# Patient Record
Sex: Female | Born: 1935
Health system: Southern US, Community
[De-identification: ages and names within clinical notes are randomized; demographics above are authoritative.]

## PROBLEM LIST (undated history)

## (undated) DIAGNOSIS — C801 Malignant (primary) neoplasm, unspecified: Secondary | ICD-10-CM

## (undated) DIAGNOSIS — J449 Chronic obstructive pulmonary disease, unspecified: Secondary | ICD-10-CM

## (undated) DIAGNOSIS — G4730 Sleep apnea, unspecified: Secondary | ICD-10-CM

## (undated) DIAGNOSIS — C73 Malignant neoplasm of thyroid gland: Secondary | ICD-10-CM

## (undated) DIAGNOSIS — F0280 Dementia in other diseases classified elsewhere without behavioral disturbance: Secondary | ICD-10-CM

## (undated) DIAGNOSIS — G309 Alzheimer's disease, unspecified: Secondary | ICD-10-CM

## (undated) DIAGNOSIS — H3530 Unspecified macular degeneration: Secondary | ICD-10-CM

## (undated) DIAGNOSIS — I509 Heart failure, unspecified: Secondary | ICD-10-CM

## (undated) DIAGNOSIS — I4891 Unspecified atrial fibrillation: Secondary | ICD-10-CM

## (undated) HISTORY — PX: THYROIDECTOMY: SHX17

## (undated) HISTORY — PX: TONSILLECTOMY: SUR1361

## (undated) HISTORY — PX: APPENDECTOMY: SHX54

## (undated) HISTORY — DX: Malignant neoplasm of thyroid gland: C73

## (undated) HISTORY — PX: OTHER SURGICAL HISTORY: SHX169

---

## 2002-12-01 ENCOUNTER — Encounter (HOSPITAL_COMMUNITY): Admission: RE | Admit: 2002-12-01 | Discharge: 2002-12-31 | Payer: Self-pay | Admitting: Internal Medicine

## 2003-02-17 ENCOUNTER — Inpatient Hospital Stay (HOSPITAL_COMMUNITY): Admission: AD | Admit: 2003-02-17 | Discharge: 2003-02-18 | Payer: Self-pay | Admitting: Internal Medicine

## 2003-02-21 ENCOUNTER — Encounter (HOSPITAL_COMMUNITY): Admission: RE | Admit: 2003-02-21 | Discharge: 2003-03-23 | Payer: Self-pay | Admitting: Internal Medicine

## 2004-02-27 ENCOUNTER — Encounter (HOSPITAL_COMMUNITY): Admission: RE | Admit: 2004-02-27 | Discharge: 2004-03-01 | Payer: Self-pay | Admitting: Internal Medicine

## 2004-11-22 ENCOUNTER — Ambulatory Visit: Payer: Self-pay | Admitting: Internal Medicine

## 2005-01-01 ENCOUNTER — Ambulatory Visit: Payer: Self-pay | Admitting: Internal Medicine

## 2008-12-06 ENCOUNTER — Ambulatory Visit: Payer: Self-pay | Admitting: Gastroenterology

## 2009-03-01 ENCOUNTER — Inpatient Hospital Stay: Payer: Self-pay | Admitting: Internal Medicine

## 2009-05-07 ENCOUNTER — Emergency Department: Payer: Self-pay | Admitting: Internal Medicine

## 2009-05-27 ENCOUNTER — Inpatient Hospital Stay: Payer: Self-pay | Admitting: Internal Medicine

## 2010-08-28 ENCOUNTER — Inpatient Hospital Stay: Payer: Self-pay | Admitting: Internal Medicine

## 2011-10-08 ENCOUNTER — Inpatient Hospital Stay: Payer: Self-pay | Admitting: Internal Medicine

## 2011-10-08 LAB — BASIC METABOLIC PANEL
Anion Gap: 13 (ref 7–16)
BUN: 22 mg/dL — ABNORMAL HIGH (ref 7–18)
Calcium, Total: 6.3 mg/dL — CL (ref 8.5–10.1)
Creatinine: 0.68 mg/dL (ref 0.60–1.30)
EGFR (African American): >60
EGFR (Non-African Amer.): >60
Glucose: 90 mg/dL (ref 65–99)
Osmolality: 282 (ref 275–301)
Sodium: 140 mmol/L (ref 136–145)

## 2011-10-08 LAB — URINALYSIS, COMPLETE
Bilirubin,UR: NEGATIVE
Glucose,UR: NEGATIVE mg/dL (ref 0–75)
Ketone: NEGATIVE
Leukocyte Esterase: NEGATIVE
Nitrite: NEGATIVE
Ph: 5 (ref 4.5–8.0)
Protein: NEGATIVE
RBC,UR: 15 /HPF (ref 0–5)
Specific Gravity: 1.023 (ref 1.003–1.030)
Squamous Epithelial: NONE SEEN
WBC UR: 4 /HPF (ref 0–5)

## 2011-10-08 LAB — CBC
HCT: 33 % — ABNORMAL LOW (ref 35.0–47.0)
HGB: 10.6 g/dL — ABNORMAL LOW (ref 12.0–16.0)
MCH: 27 pg (ref 26.0–34.0)
MCHC: 32.1 g/dL (ref 32.0–36.0)
MCV: 84 fL (ref 80–100)
RBC: 3.93 10*6/uL (ref 3.80–5.20)
RDW: 14.9 % — ABNORMAL HIGH (ref 11.5–14.5)
WBC: 6.6 10*3/uL (ref 3.6–11.0)

## 2011-10-08 LAB — MAGNESIUM
Magnesium: 1.5 mg/dL — ABNORMAL LOW
Magnesium: 15.6 mg/dL
Magnesium: 1.8 mg/dL

## 2011-10-08 LAB — COMPREHENSIVE METABOLIC PANEL
Albumin: 3.7 g/dL (ref 3.4–5.0)
Alkaline Phosphatase: 63 U/L (ref 50–136)
Anion Gap: 11 (ref 7–16)
BUN: 26 mg/dL — ABNORMAL HIGH (ref 7–18)
Bilirubin,Total: 0.5 mg/dL (ref 0.2–1.0)
Calcium, Total: 6 mg/dL — CL (ref 8.5–10.1)
Chloride: 95 mmol/L — ABNORMAL LOW (ref 98–107)
Co2: 36 mmol/L — ABNORMAL HIGH (ref 21–32)
Creatinine: 0.81 mg/dL (ref 0.60–1.30)
EGFR (African American): >60
EGFR (Non-African Amer.): >60
Osmolality: 289 (ref 275–301)
Potassium: 4 mmol/L (ref 3.5–5.1)
SGOT(AST): 86 U/L — ABNORMAL HIGH (ref 15–37)
SGPT (ALT): 71 U/L
Sodium: 142 mmol/L (ref 136–145)
Total Protein: 7.9 g/dL (ref 6.4–8.2)

## 2011-10-08 LAB — DIGOXIN LEVEL
Digoxin: 0.67 ng/mL
Digoxin: 0.67 ng/mL

## 2011-10-08 LAB — TROPONIN I: Troponin-I: <0.02 ng/mL

## 2011-10-09 LAB — CBC WITH DIFFERENTIAL/PLATELET
Basophil #: 0 10*3/uL (ref 0.0–0.1)
Basophil %: 0.1 %
HCT: 30.7 % — ABNORMAL LOW (ref 35.0–47.0)
Lymphocyte #: 0.4 10*3/uL — ABNORMAL LOW (ref 1.0–3.6)
Lymphocyte %: 9.6 %
MCH: 27.1 pg (ref 26.0–34.0)
MCHC: 32.5 g/dL (ref 32.0–36.0)
MCV: 83 fL (ref 80–100)
Monocyte #: 0.2 10*3/uL (ref 0.0–0.7)
Neutrophil #: 4 10*3/uL (ref 1.4–6.5)
Platelet: 204 10*3/uL (ref 150–440)
RDW: 14.8 % — ABNORMAL HIGH (ref 11.5–14.5)

## 2011-10-09 LAB — MAGNESIUM: Magnesium: 1.4 mg/dL — ABNORMAL LOW

## 2011-10-09 LAB — BASIC METABOLIC PANEL
Calcium, Total: 6.1 mg/dL — CL (ref 8.5–10.1)
Chloride: 92 mmol/L — ABNORMAL LOW (ref 98–107)
Co2: 35 mmol/L — ABNORMAL HIGH (ref 21–32)
Creatinine: 0.71 mg/dL (ref 0.60–1.30)
EGFR (Non-African Amer.): >60
Glucose: 139 mg/dL — ABNORMAL HIGH (ref 65–99)
Potassium: 3.9 mmol/L (ref 3.5–5.1)
Sodium: 138 mmol/L (ref 136–145)

## 2011-10-09 LAB — PROTIME-INR
INR: 1
Prothrombin Time: 13.7 secs (ref 11.5–14.7)

## 2011-10-09 LAB — ALBUMIN: Albumin: 3.1 g/dL — ABNORMAL LOW (ref 3.4–5.0)

## 2011-10-10 LAB — BASIC METABOLIC PANEL
Anion Gap: 8 (ref 7–16)
Calcium, Total: 6.3 mg/dL — CL (ref 8.5–10.1)
Co2: 37 mmol/L — ABNORMAL HIGH (ref 21–32)
Creatinine: 0.89 mg/dL (ref 0.60–1.30)
EGFR (African American): >60
EGFR (Non-African Amer.): >60
Osmolality: 280 (ref 275–301)

## 2011-10-10 LAB — MAGNESIUM: Magnesium: 1.9 mg/dL

## 2011-10-11 LAB — CBC WITH DIFFERENTIAL/PLATELET
Basophil %: 0.1 %
Eosinophil %: 0 %
HCT: 32.2 % — ABNORMAL LOW (ref 35.0–47.0)
Lymphocyte #: 0.4 10*3/uL — ABNORMAL LOW (ref 1.0–3.6)
MCHC: 32.9 g/dL (ref 32.0–36.0)
MCV: 84 fL (ref 80–100)
Monocyte #: 0.2 10*3/uL (ref 0.0–0.7)
Monocyte %: 2.8 %
Neutrophil #: 7.8 10*3/uL — ABNORMAL HIGH (ref 1.4–6.5)
RBC: 3.85 10*6/uL (ref 3.80–5.20)
RDW: 15.5 % — ABNORMAL HIGH (ref 11.5–14.5)

## 2011-10-11 LAB — BASIC METABOLIC PANEL
Anion Gap: 10 (ref 7–16)
BUN: 33 mg/dL — ABNORMAL HIGH (ref 7–18)
Calcium, Total: 6.3 mg/dL — CL (ref 8.5–10.1)
EGFR (African American): >60
EGFR (Non-African Amer.): >60
Glucose: 149 mg/dL — ABNORMAL HIGH (ref 65–99)
Osmolality: 288 (ref 275–301)
Potassium: 3.8 mmol/L (ref 3.5–5.1)

## 2011-10-12 LAB — BASIC METABOLIC PANEL
Anion Gap: 10 (ref 7–16)
BUN: 33 mg/dL — ABNORMAL HIGH (ref 7–18)
Calcium, Total: 6.4 mg/dL — CL (ref 8.5–10.1)
Chloride: 89 mmol/L — ABNORMAL LOW (ref 98–107)
Co2: 40 mmol/L (ref 21–32)
Osmolality: 286 (ref 275–301)
Potassium: 4 mmol/L (ref 3.5–5.1)

## 2011-10-13 LAB — BASIC METABOLIC PANEL
BUN: 27 mg/dL — ABNORMAL HIGH (ref 7–18)
Calcium, Total: 6.3 mg/dL — CL (ref 8.5–10.1)
EGFR (African American): >60
EGFR (Non-African Amer.): >60
Glucose: 92 mg/dL (ref 65–99)
Osmolality: 288 (ref 275–301)
Potassium: 3.5 mmol/L (ref 3.5–5.1)
Sodium: 142 mmol/L (ref 136–145)

## 2011-10-14 LAB — BASIC METABOLIC PANEL
Anion Gap: 10 (ref 7–16)
BUN: 23 mg/dL — ABNORMAL HIGH (ref 7–18)
Chloride: 87 mmol/L — ABNORMAL LOW (ref 98–107)
Co2: 41 mmol/L (ref 21–32)
Creatinine: 0.71 mg/dL (ref 0.60–1.30)
EGFR (African American): >60
EGFR (Non-African Amer.): >60
Glucose: 87 mg/dL (ref 65–99)
Osmolality: 279 (ref 275–301)
Sodium: 138 mmol/L (ref 136–145)

## 2011-10-14 LAB — MAGNESIUM: Magnesium: 1.8 mg/dL

## 2012-01-17 LAB — URINALYSIS, COMPLETE
Bilirubin,UR: NEGATIVE
Blood: NEGATIVE
Glucose,UR: NEGATIVE mg/dL (ref 0–75)
Ketone: NEGATIVE
Nitrite: NEGATIVE
Ph: 6 (ref 4.5–8.0)
RBC,UR: 6 /HPF (ref 0–5)
Specific Gravity: 1.012 (ref 1.003–1.030)
Squamous Epithelial: 18

## 2012-01-17 LAB — CBC
MCH: 26.2 pg (ref 26.0–34.0)
MCHC: 32.4 g/dL (ref 32.0–36.0)
RDW: 14.9 % — ABNORMAL HIGH (ref 11.5–14.5)

## 2012-01-17 LAB — COMPREHENSIVE METABOLIC PANEL
Alkaline Phosphatase: 79 U/L (ref 50–136)
BUN: 25 mg/dL — ABNORMAL HIGH (ref 7–18)
Bilirubin,Total: 0.4 mg/dL (ref 0.2–1.0)
Calcium, Total: 12.9 mg/dL — ABNORMAL HIGH (ref 8.5–10.1)
EGFR (African American): 54 — ABNORMAL LOW
EGFR (Non-African Amer.): 47 — ABNORMAL LOW
Glucose: 111 mg/dL — ABNORMAL HIGH (ref 65–99)
Potassium: 3.4 mmol/L — ABNORMAL LOW (ref 3.5–5.1)
SGPT (ALT): 22 U/L
Sodium: 137 mmol/L (ref 136–145)

## 2012-01-18 ENCOUNTER — Inpatient Hospital Stay: Payer: Self-pay | Admitting: Internal Medicine

## 2012-01-18 LAB — BASIC METABOLIC PANEL
Anion Gap: 8 (ref 7–16)
BUN: 22 mg/dL — ABNORMAL HIGH (ref 7–18)
Calcium, Total: 11.8 mg/dL — ABNORMAL HIGH (ref 8.5–10.1)
Chloride: 96 mmol/L — ABNORMAL LOW (ref 98–107)
Co2: 35 mmol/L — ABNORMAL HIGH (ref 21–32)
EGFR (African American): 48 — ABNORMAL LOW
EGFR (Non-African Amer.): 42 — ABNORMAL LOW
Glucose: 124 mg/dL — ABNORMAL HIGH (ref 65–99)
Potassium: 2.8 mmol/L — ABNORMAL LOW (ref 3.5–5.1)
Sodium: 139 mmol/L (ref 136–145)

## 2012-01-18 LAB — CK TOTAL AND CKMB (NOT AT ARMC)
CK, Total: 76 U/L (ref 21–215)
CK, Total: 75 U/L (ref 21–215)

## 2012-01-18 LAB — MAGNESIUM: Magnesium: 1.2 mg/dL — ABNORMAL LOW

## 2012-01-18 LAB — TROPONIN I
Troponin-I: 0.04 ng/mL
Troponin-I: <0.02 ng/mL

## 2012-01-19 LAB — BASIC METABOLIC PANEL
Anion Gap: 9 (ref 7–16)
BUN: 22 mg/dL — ABNORMAL HIGH (ref 7–18)
Calcium, Total: 9.5 mg/dL (ref 8.5–10.1)
Co2: 31 mmol/L (ref 21–32)
Osmolality: 286 (ref 275–301)
Sodium: 140 mmol/L (ref 136–145)

## 2012-01-19 LAB — HEMOGLOBIN A1C: Hemoglobin A1C: 6.9 % — ABNORMAL HIGH (ref 4.2–6.3)

## 2012-01-19 LAB — CBC WITH DIFFERENTIAL/PLATELET
Basophil #: 0 10*3/uL (ref 0.0–0.1)
Basophil %: 0.1 %
Eosinophil %: 0.1 %
HCT: 27 % — ABNORMAL LOW (ref 35.0–47.0)
Lymphocyte #: 0.6 10*3/uL — ABNORMAL LOW (ref 1.0–3.6)
Monocyte #: 0.2 x10 3/mm (ref 0.2–0.9)
Neutrophil %: 88.2 %
RDW: 14.9 % — ABNORMAL HIGH (ref 11.5–14.5)
WBC: 6.7 10*3/uL (ref 3.6–11.0)

## 2012-01-19 LAB — MAGNESIUM: Magnesium: 2.4 mg/dL

## 2012-01-20 LAB — URINE CULTURE

## 2012-01-27 ENCOUNTER — Ambulatory Visit: Payer: Self-pay | Admitting: Internal Medicine

## 2012-01-31 ENCOUNTER — Other Ambulatory Visit: Payer: Self-pay | Admitting: Internal Medicine

## 2012-01-31 ENCOUNTER — Ambulatory Visit: Payer: Self-pay | Admitting: Otolaryngology

## 2012-01-31 LAB — BASIC METABOLIC PANEL
Anion Gap: 7 (ref 7–16)
BUN: 12 mg/dL (ref 7–18)
Calcium, Total: 9.2 mg/dL (ref 8.5–10.1)
Chloride: 96 mmol/L — ABNORMAL LOW (ref 98–107)
Co2: 34 mmol/L — ABNORMAL HIGH (ref 21–32)
EGFR (African American): >60
EGFR (Non-African Amer.): >60
Glucose: 112 mg/dL — ABNORMAL HIGH (ref 65–99)
Osmolality: 274 (ref 275–301)
Potassium: 4.1 mmol/L (ref 3.5–5.1)
Sodium: 137 mmol/L (ref 136–145)

## 2012-06-03 ENCOUNTER — Ambulatory Visit: Payer: Self-pay | Admitting: Internal Medicine

## 2012-12-01 ENCOUNTER — Ambulatory Visit: Payer: Self-pay | Admitting: Internal Medicine

## 2012-12-28 ENCOUNTER — Ambulatory Visit: Payer: Self-pay | Admitting: Unknown Physician Specialty

## 2013-01-13 ENCOUNTER — Ambulatory Visit: Payer: Self-pay | Admitting: Internal Medicine

## 2013-01-22 ENCOUNTER — Ambulatory Visit: Payer: Self-pay | Admitting: Oncology

## 2013-01-26 ENCOUNTER — Ambulatory Visit: Payer: Self-pay | Admitting: Oncology

## 2013-01-27 LAB — CBC CANCER CENTER
Basophil #: 0.1 x10 3/mm (ref 0.0–0.1)
Basophil %: 0.9 %
HCT: 34.8 % — ABNORMAL LOW (ref 35.0–47.0)
HGB: 11.3 g/dL — ABNORMAL LOW (ref 12.0–16.0)
Neutrophil #: 5.7 x10 3/mm (ref 1.4–6.5)
Platelet: 237 x10 3/mm (ref 150–440)
RBC: 4.25 10*6/uL (ref 3.80–5.20)
RDW: 14.9 % — ABNORMAL HIGH (ref 11.5–14.5)
WBC: 8.4 x10 3/mm (ref 3.6–11.0)

## 2013-01-27 LAB — TSH: Thyroid Stimulating Horm: 2.24 u[IU]/mL

## 2013-02-17 ENCOUNTER — Ambulatory Visit: Payer: Self-pay | Admitting: Oncology

## 2013-02-18 LAB — CBC CANCER CENTER
Basophil %: 1.5 %
Eosinophil #: 0.4 x10 3/mm (ref 0.0–0.7)
Eosinophil %: 5.3 %
HCT: 36 % (ref 35.0–47.0)
Lymphocyte #: 1.5 x10 3/mm (ref 1.0–3.6)
Lymphocyte %: 18.2 %
MCH: 26.7 pg (ref 26.0–34.0)
MCHC: 32.5 g/dL (ref 32.0–36.0)
MCV: 82 fL (ref 80–100)
Monocyte #: 0.6 x10 3/mm (ref 0.2–0.9)
Neutrophil #: 5.7 x10 3/mm (ref 1.4–6.5)
Neutrophil %: 67.4 %
Platelet: 212 x10 3/mm (ref 150–440)
RBC: 4.38 10*6/uL (ref 3.80–5.20)
RDW: 14.6 % — ABNORMAL HIGH (ref 11.5–14.5)

## 2013-02-21 ENCOUNTER — Ambulatory Visit: Payer: Self-pay | Admitting: Oncology

## 2013-02-25 LAB — CBC CANCER CENTER
Basophil #: 0.1 x10 3/mm (ref 0.0–0.1)
Basophil %: 1.1 %
Eosinophil #: 0.4 x10 3/mm (ref 0.0–0.7)
Eosinophil %: 5 %
HGB: 11.9 g/dL — ABNORMAL LOW (ref 12.0–16.0)
Lymphocyte %: 20.7 %
MCH: 27.4 pg (ref 26.0–34.0)
MCHC: 33.5 g/dL (ref 32.0–36.0)
Neutrophil #: 5.2 x10 3/mm (ref 1.4–6.5)
Neutrophil %: 63.7 %
Platelet: 233 x10 3/mm (ref 150–440)
RDW: 14.7 % — ABNORMAL HIGH (ref 11.5–14.5)
WBC: 8.2 x10 3/mm (ref 3.6–11.0)

## 2013-03-04 LAB — CBC CANCER CENTER
Basophil #: 0.1 x10 3/mm (ref 0.0–0.1)
Eosinophil #: 0.3 x10 3/mm (ref 0.0–0.7)
HCT: 35 % (ref 35.0–47.0)
Lymphocyte #: 1.3 x10 3/mm (ref 1.0–3.6)
MCH: 27.4 pg (ref 26.0–34.0)
MCV: 81 fL (ref 80–100)
Monocyte #: 0.5 x10 3/mm (ref 0.2–0.9)
Monocyte %: 7.7 %
Neutrophil #: 4.4 x10 3/mm (ref 1.4–6.5)
Platelet: 213 x10 3/mm (ref 150–440)
RBC: 4.32 10*6/uL (ref 3.80–5.20)
RDW: 14.9 % — ABNORMAL HIGH (ref 11.5–14.5)
WBC: 6.6 x10 3/mm (ref 3.6–11.0)

## 2013-03-11 LAB — CBC CANCER CENTER
Basophil #: 0.1 x10 3/mm (ref 0.0–0.1)
Basophil %: 1.1 %
Eosinophil %: 4.1 %
Lymphocyte #: 1.2 x10 3/mm (ref 1.0–3.6)
MCH: 27.4 pg (ref 26.0–34.0)
MCHC: 33.9 g/dL (ref 32.0–36.0)
MCV: 81 fL (ref 80–100)
Monocyte #: 0.6 x10 3/mm (ref 0.2–0.9)
Neutrophil #: 4.8 x10 3/mm (ref 1.4–6.5)
RBC: 4.12 10*6/uL (ref 3.80–5.20)

## 2013-03-18 LAB — CBC CANCER CENTER
Basophil #: 0.1 x10 3/mm (ref 0.0–0.1)
Eosinophil #: 0.3 x10 3/mm (ref 0.0–0.7)
Eosinophil %: 4.1 %
HCT: 34.1 % — ABNORMAL LOW (ref 35.0–47.0)
HGB: 11.5 g/dL — ABNORMAL LOW (ref 12.0–16.0)
Lymphocyte #: 1.3 x10 3/mm (ref 1.0–3.6)
Lymphocyte %: 18.8 %
MCH: 27.6 pg (ref 26.0–34.0)
MCHC: 33.7 g/dL (ref 32.0–36.0)
MCV: 82 fL (ref 80–100)
Monocyte #: 0.6 x10 3/mm (ref 0.2–0.9)
Monocyte %: 8.9 %
Neutrophil %: 66.9 %
Platelet: 206 x10 3/mm (ref 150–440)
RBC: 4.16 10*6/uL (ref 3.80–5.20)
RDW: 14.9 % — ABNORMAL HIGH (ref 11.5–14.5)
WBC: 7.2 x10 3/mm (ref 3.6–11.0)

## 2013-03-23 ENCOUNTER — Ambulatory Visit: Payer: Self-pay | Admitting: Oncology

## 2013-03-25 LAB — CBC CANCER CENTER
Basophil #: 0.1 x10 3/mm (ref 0.0–0.1)
Basophil %: 1.1 %
Eosinophil %: 4 %
Lymphocyte #: 1.3 x10 3/mm (ref 1.0–3.6)
Lymphocyte %: 17.5 %
MCH: 27.5 pg (ref 26.0–34.0)
MCHC: 33.4 g/dL (ref 32.0–36.0)
MCV: 82 fL (ref 80–100)
Monocyte #: 0.6 x10 3/mm (ref 0.2–0.9)
Monocyte %: 8 %
RBC: 4.1 10*6/uL (ref 3.80–5.20)
RDW: 15.1 % — ABNORMAL HIGH (ref 11.5–14.5)
WBC: 7.2 x10 3/mm (ref 3.6–11.0)

## 2013-04-21 ENCOUNTER — Ambulatory Visit: Payer: Self-pay | Admitting: Internal Medicine

## 2013-04-23 ENCOUNTER — Ambulatory Visit: Payer: Self-pay | Admitting: Oncology

## 2013-05-25 ENCOUNTER — Ambulatory Visit: Payer: Self-pay | Admitting: Oncology

## 2013-05-25 LAB — CBC CANCER CENTER
Eosinophil %: 3.6 %
HCT: 34.1 % — ABNORMAL LOW (ref 35.0–47.0)
HGB: 11.4 g/dL — ABNORMAL LOW (ref 12.0–16.0)
Lymphocyte %: 17.7 %
MCHC: 33.6 g/dL (ref 32.0–36.0)
MCV: 81 fL (ref 80–100)
Monocyte #: 0.5 x10 3/mm (ref 0.2–0.9)
Monocyte %: 7.9 %
Neutrophil %: 69.8 %
Platelet: 215 x10 3/mm (ref 150–440)
RDW: 15 % — ABNORMAL HIGH (ref 11.5–14.5)
WBC: 6.4 x10 3/mm (ref 3.6–11.0)

## 2013-05-25 LAB — COMPREHENSIVE METABOLIC PANEL
Albumin: 3.9 g/dL (ref 3.4–5.0)
BUN: 28 mg/dL — ABNORMAL HIGH (ref 7–18)
Bilirubin,Total: 0.6 mg/dL (ref 0.2–1.0)
Chloride: 96 mmol/L — ABNORMAL LOW (ref 98–107)
Co2: 35 mmol/L — ABNORMAL HIGH (ref 21–32)
Creatinine: 1.05 mg/dL (ref 0.60–1.30)
EGFR (African American): 58 — ABNORMAL LOW
Glucose: 96 mg/dL (ref 65–99)
SGOT(AST): 26 U/L (ref 15–37)
SGPT (ALT): 22 U/L (ref 12–78)
Sodium: 137 mmol/L (ref 136–145)
Total Protein: 7.7 g/dL (ref 6.4–8.2)

## 2013-06-23 ENCOUNTER — Ambulatory Visit: Payer: Self-pay | Admitting: Oncology

## 2013-07-07 LAB — CBC CANCER CENTER
Basophil #: 0.1 10*3/uL
Basophil %: 1.3 %
Eosinophil #: 0.4 10*3/uL
Eosinophil %: 4.6 %
HCT: 35.3 %
HGB: 11.8 g/dL — ABNORMAL LOW
Lymphocyte %: 16.6 %
Lymphs Abs: 1.5 10*3/uL
MCH: 27.1 pg
MCHC: 33.5 g/dL
MCV: 81 fL
Monocyte #: 0.7 10*3/uL
Monocyte %: 7.4 %
Neutrophil #: 6.2 10*3/uL
Neutrophil %: 70.1 %
Platelet: 241 10*3/uL
RBC: 4.38 10*6/uL
RDW: 15.1 % — ABNORMAL HIGH
WBC: 8.8 10*3/uL

## 2013-07-07 LAB — COMPREHENSIVE METABOLIC PANEL WITH GFR
Albumin: 4 g/dL
Alkaline Phosphatase: 68 U/L
Anion Gap: 4 — ABNORMAL LOW
BUN: 28 mg/dL — ABNORMAL HIGH
Bilirubin,Total: 0.4 mg/dL
Calcium, Total: 10 mg/dL
Chloride: 96 mmol/L — ABNORMAL LOW
Co2: 33 mmol/L — ABNORMAL HIGH
Creatinine: 0.9 mg/dL
EGFR (African American): >60
EGFR (Non-African Amer.): >60
Glucose: 92 mg/dL
Osmolality: 271
Potassium: 4.3 mmol/L
SGOT(AST): 33 U/L
SGPT (ALT): 26 U/L
Sodium: 133 mmol/L — ABNORMAL LOW
Total Protein: 7.9 g/dL

## 2013-07-07 LAB — TSH: Thyroid Stimulating Horm: 5.88 u[IU]/mL — ABNORMAL HIGH

## 2013-07-24 ENCOUNTER — Ambulatory Visit: Payer: Self-pay | Admitting: Oncology

## 2013-08-18 ENCOUNTER — Ambulatory Visit: Payer: Self-pay | Admitting: Neurology

## 2013-08-25 ENCOUNTER — Ambulatory Visit: Payer: Self-pay | Admitting: Oncology

## 2013-09-23 ENCOUNTER — Ambulatory Visit: Payer: Self-pay | Admitting: Oncology

## 2013-09-28 ENCOUNTER — Ambulatory Visit: Payer: Self-pay | Admitting: Oncology

## 2013-09-29 LAB — COMPREHENSIVE METABOLIC PANEL
Albumin: 4.1 g/dL (ref 3.4–5.0)
Alkaline Phosphatase: 62 U/L
Anion Gap: 7 (ref 7–16)
BUN: 24 mg/dL — AB (ref 7–18)
Bilirubin,Total: 0.3 mg/dL (ref 0.2–1.0)
CALCIUM: 9.1 mg/dL (ref 8.5–10.1)
CREATININE: 1.02 mg/dL (ref 0.60–1.30)
Chloride: 97 mmol/L — ABNORMAL LOW (ref 98–107)
Co2: 33 mmol/L — ABNORMAL HIGH (ref 21–32)
EGFR (African American): >60
EGFR (Non-African Amer.): 52 — ABNORMAL LOW
Glucose: 106 mg/dL — ABNORMAL HIGH (ref 65–99)
Osmolality: 278 (ref 275–301)
POTASSIUM: 4.5 mmol/L (ref 3.5–5.1)
SGOT(AST): 24 U/L (ref 15–37)
SGPT (ALT): 25 U/L (ref 12–78)
Sodium: 137 mmol/L (ref 136–145)
Total Protein: 8 g/dL (ref 6.4–8.2)

## 2013-09-29 LAB — CBC CANCER CENTER
BASOS ABS: 0.1 x10 3/mm (ref 0.0–0.1)
Basophil %: 1.1 %
EOS ABS: 0.3 x10 3/mm (ref 0.0–0.7)
Eosinophil %: 3.6 %
HCT: 35.2 % (ref 35.0–47.0)
HGB: 11.3 g/dL — ABNORMAL LOW (ref 12.0–16.0)
Lymphocyte #: 1.2 x10 3/mm (ref 1.0–3.6)
Lymphocyte %: 16.7 %
MCH: 26.5 pg (ref 26.0–34.0)
MCHC: 32.2 g/dL (ref 32.0–36.0)
MCV: 82 fL (ref 80–100)
Monocyte #: 0.5 x10 3/mm (ref 0.2–0.9)
Monocyte %: 7.2 %
Neutrophil #: 5 x10 3/mm (ref 1.4–6.5)
Neutrophil %: 71.4 %
Platelet: 248 x10 3/mm (ref 150–440)
RBC: 4.28 10*6/uL (ref 3.80–5.20)
RDW: 15.2 % — AB (ref 11.5–14.5)
WBC: 7 x10 3/mm (ref 3.6–11.0)

## 2013-09-29 LAB — TSH: THYROID STIMULATING HORM: 5.21 u[IU]/mL — AB

## 2013-10-24 ENCOUNTER — Ambulatory Visit: Payer: Self-pay | Admitting: Oncology

## 2014-01-26 ENCOUNTER — Ambulatory Visit: Payer: Self-pay | Admitting: Oncology

## 2014-01-26 LAB — CBC CANCER CENTER
BASOS ABS: 0.1 x10 3/mm (ref 0.0–0.1)
Basophil %: 1.1 %
Eosinophil #: 0.3 x10 3/mm (ref 0.0–0.7)
Eosinophil %: 3.8 %
HCT: 33.8 % — AB (ref 35.0–47.0)
HGB: 11 g/dL — ABNORMAL LOW (ref 12.0–16.0)
LYMPHS PCT: 20.2 %
Lymphocyte #: 1.4 x10 3/mm (ref 1.0–3.6)
MCH: 26.2 pg (ref 26.0–34.0)
MCHC: 32.6 g/dL (ref 32.0–36.0)
MCV: 80 fL (ref 80–100)
MONO ABS: 0.5 x10 3/mm (ref 0.2–0.9)
Monocyte %: 7.7 %
NEUTROS PCT: 67.2 %
Neutrophil #: 4.7 x10 3/mm (ref 1.4–6.5)
Platelet: 224 x10 3/mm (ref 150–440)
RBC: 4.21 10*6/uL (ref 3.80–5.20)
RDW: 15.7 % — AB (ref 11.5–14.5)
WBC: 7 x10 3/mm (ref 3.6–11.0)

## 2014-01-26 LAB — COMPREHENSIVE METABOLIC PANEL
ANION GAP: 3 — AB (ref 7–16)
Albumin: 3.7 g/dL (ref 3.4–5.0)
Alkaline Phosphatase: 54 U/L
BILIRUBIN TOTAL: 0.4 mg/dL (ref 0.2–1.0)
BUN: 26 mg/dL — ABNORMAL HIGH (ref 7–18)
CO2: 35 mmol/L — AB (ref 21–32)
CREATININE: 1.07 mg/dL (ref 0.60–1.30)
Calcium, Total: 8.5 mg/dL (ref 8.5–10.1)
Chloride: 99 mmol/L (ref 98–107)
EGFR (African American): 57 — ABNORMAL LOW
EGFR (Non-African Amer.): 49 — ABNORMAL LOW
GLUCOSE: 102 mg/dL — AB (ref 65–99)
Osmolality: 279 (ref 275–301)
Potassium: 4.3 mmol/L (ref 3.5–5.1)
SGOT(AST): 21 U/L (ref 15–37)
SGPT (ALT): 21 U/L (ref 12–78)
Sodium: 137 mmol/L (ref 136–145)
Total Protein: 7.6 g/dL (ref 6.4–8.2)

## 2014-01-26 LAB — TSH: THYROID STIMULATING HORM: 1.03 u[IU]/mL

## 2014-02-21 ENCOUNTER — Ambulatory Visit: Payer: Self-pay | Admitting: Oncology

## 2014-03-23 ENCOUNTER — Ambulatory Visit: Payer: Self-pay | Admitting: Oncology

## 2014-04-07 DIAGNOSIS — F32A Depression, unspecified: Secondary | ICD-10-CM | POA: Insufficient documentation

## 2014-04-07 DIAGNOSIS — J449 Chronic obstructive pulmonary disease, unspecified: Secondary | ICD-10-CM | POA: Insufficient documentation

## 2014-04-07 DIAGNOSIS — G4733 Obstructive sleep apnea (adult) (pediatric): Secondary | ICD-10-CM | POA: Insufficient documentation

## 2014-04-07 DIAGNOSIS — E039 Hypothyroidism, unspecified: Secondary | ICD-10-CM | POA: Insufficient documentation

## 2014-04-07 DIAGNOSIS — F329 Major depressive disorder, single episode, unspecified: Secondary | ICD-10-CM | POA: Insufficient documentation

## 2014-04-07 DIAGNOSIS — I482 Chronic atrial fibrillation, unspecified: Secondary | ICD-10-CM | POA: Insufficient documentation

## 2014-04-07 DIAGNOSIS — Z8679 Personal history of other diseases of the circulatory system: Secondary | ICD-10-CM | POA: Insufficient documentation

## 2014-05-04 ENCOUNTER — Ambulatory Visit: Payer: Self-pay | Admitting: Oncology

## 2014-05-04 LAB — CBC CANCER CENTER
Basophil #: 0.1 x10 3/mm (ref 0.0–0.1)
Basophil %: 1.2 %
Eosinophil #: 0.2 x10 3/mm (ref 0.0–0.7)
Eosinophil %: 3 %
HCT: 30 % — AB (ref 35.0–47.0)
HGB: 9.8 g/dL — ABNORMAL LOW (ref 12.0–16.0)
LYMPHS PCT: 19.8 %
Lymphocyte #: 1.2 x10 3/mm (ref 1.0–3.6)
MCH: 26.6 pg (ref 26.0–34.0)
MCHC: 32.8 g/dL (ref 32.0–36.0)
MCV: 81 fL (ref 80–100)
MONO ABS: 0.5 x10 3/mm (ref 0.2–0.9)
Monocyte %: 7.6 %
NEUTROS ABS: 4.2 x10 3/mm (ref 1.4–6.5)
NEUTROS PCT: 68.4 %
Platelet: 228 x10 3/mm (ref 150–440)
RBC: 3.7 10*6/uL — ABNORMAL LOW (ref 3.80–5.20)
RDW: 15.6 % — AB (ref 11.5–14.5)
WBC: 6.1 x10 3/mm (ref 3.6–11.0)

## 2014-05-04 LAB — COMPREHENSIVE METABOLIC PANEL
ALT: 21 U/L
Albumin: 3.5 g/dL (ref 3.4–5.0)
Alkaline Phosphatase: 50 U/L
Anion Gap: 8 (ref 7–16)
BILIRUBIN TOTAL: 0.3 mg/dL (ref 0.2–1.0)
BUN: 25 mg/dL — AB (ref 7–18)
CALCIUM: 8.5 mg/dL (ref 8.5–10.1)
CHLORIDE: 96 mmol/L — AB (ref 98–107)
Co2: 32 mmol/L (ref 21–32)
Creatinine: 1.09 mg/dL (ref 0.60–1.30)
GFR CALC AF AMER: 56 — AB
GFR CALC NON AF AMER: 48 — AB
Glucose: 112 mg/dL — ABNORMAL HIGH (ref 65–99)
Osmolality: 277 (ref 275–301)
POTASSIUM: 4.2 mmol/L (ref 3.5–5.1)
SGOT(AST): 17 U/L (ref 15–37)
Sodium: 136 mmol/L (ref 136–145)
Total Protein: 7 g/dL (ref 6.4–8.2)

## 2014-05-24 ENCOUNTER — Ambulatory Visit: Payer: Self-pay | Admitting: Oncology

## 2014-07-15 ENCOUNTER — Ambulatory Visit: Payer: Self-pay

## 2014-09-06 ENCOUNTER — Ambulatory Visit: Payer: Self-pay | Admitting: Oncology

## 2014-09-23 ENCOUNTER — Ambulatory Visit: Payer: Self-pay | Admitting: Oncology

## 2014-11-10 ENCOUNTER — Ambulatory Visit: Payer: Self-pay | Admitting: Oncology

## 2014-11-11 ENCOUNTER — Ambulatory Visit: Payer: Self-pay | Admitting: Oncology

## 2014-11-22 ENCOUNTER — Ambulatory Visit
Admit: 2014-11-22 | Disposition: A | Discharge status: Home / Self Care | Payer: Self-pay | Attending: Oncology | Admitting: Oncology

## 2014-12-23 ENCOUNTER — Ambulatory Visit
Admit: 2014-12-23 | Disposition: A | Discharge status: Home / Self Care | Payer: Self-pay | Attending: Oncology | Admitting: Oncology

## 2015-01-13 NOTE — Consult Note (Signed)
Reason for Visit: This 81 year old Female patient presents to the clinic for initial evaluation of  recurrent thyroid cancer .   Referred by Dr. Beverly Gust.  Diagnosis:  Chief Complaint/Diagnosis   81 year old female with right vocal cord paralysis and recurrent papillary thyroid cancer biopsy proven  Pathology Report pathology report reviewed   Imaging Report CT scan reviewed   Referral Report clinical notes reviewed   Planned Treatment Regimen IMRT radiation therapy   HPI   patient is a 81 year old female now 10 years out having received total thyroidectomy and parathyroidectomy for papillary thyroid cancer. She is multiple comorbidities including atrial fibrillation, diastolic dysfunction, congestive heart failure, and obstructive sleep apnea. Recently presented with increasinghoarseness and was found to have right vocal cord paralysis. CT scan was performed on December 28, 2012 showing right parapharyngeal soft tissue prominence measuring approximately 2 cm. He was anterior to the esophagus that the region of the thoracic inlet. Transtracheal biopsy was performed and positive for papillary thyroid cancer. We have discussed her case at our weekly tumor conference. Based on her comorbidities and location of the lesion surgical resection would be problematic. Recommendation was made for her radiation therapy and she is seen today for consultation. She is doing fairly well. Except for the course as she is having no dysphasia cough.  Past Hx:    COPD:    hyponatermia:    CHF:    atrial fibrillation:    Depression:    Thyroid CA:    Hypocalcemia:    Appendectomy:    Thyroid removed:   Past, Family and Social History:  Past Medical History positive   Cardiovascular atrial fibrillation; congestive heart failure; diastolic dysfunction, valvular heart disease   Respiratory COPD; obstructive sleep apnea   Endocrine hypothyroidism   Neurological/Psychiatric depression    Past Surgical History appendectomy; thyroidectomy and parathyroidectomy, tonsillectomy   Past Medical History Comments hypocalcemia, chronic anemia   Family History positive   Family History Comments mother with adult onset diabetes, some with colorectal cancer   Social History positive   Social History Comments 50-pack-year smoking history quit smoking approximately 4 years prior no EtOH abuse history   Additional Past Medical and Surgical History accompanied by multiple daughters today   Allergies:   NKDA: None  Home Meds:  Home Medications: Medication Instructions Status  metoprolol tablet 25 mg 1 tab(s) orally 2 times a day  Active  Lanoxin 125 mcg (0.125 mg) oral tablet 1 tab(s) orally every other day Active  Levoxyl 178mcg  once a day  Active  Fosamax 70 mg oral tablet 1 tab(s) orally once a week 30 min before breakfast Active  enalapril 2.5 mg oral tablet 1 tab(s) orally once a day Active  escitalopram 20 mg oral tablet 1 tab(s) orally once a day in the AM Active  cetirizine 10 mg oral tablet 1 tab(s) orally once a day. Active  aspirin 81 mg oral tablet 1 tab(s) orally once a day Active  Plavix 75 mg oral tablet 1  orally once a day  Active  FeroSul 325 mg (65 mg elemental iron) oral tablet 1  orally once a day  Active  Lipitor 20 mg oral tablet 1  orally once a day  Active  Ocuvite Lutein oral capsule 1  orally once a day  Active  Lasix 20 mg oral tablet 1 tab(s) orally once a day Active  Tylenol 325 mg oral tablet 1 tab(s) orally , As Needed ever 6 hrs Active  Zithromax 250  mg oral tablet 1 tab(s) orally once a day for 4 more days Active  predniSONE  orally 60 mg po once on 4-30 and then taper by 10 mg daily until stop Active  Colace 100 mg oral capsule 1 cap(s) orally 2 times a day Active  MiraLax  orally 17 gr po daily dissolved in 8 oz water or juice as needed Active  duonebs   3 ml every 4 hours as needed Active  calcitriol 0.25 mcg oral capsule 1 cap(s) orally 2  times a day Active  Calcium 600+D 600 mg-200 units oral tablet 2 tab(s) orally 3 times a day Active  DuoNeb 2.5 mg-0.5 mg/3 mL inhalation solution  inhaled every 4 hours, As Needed - for Shortness of Breath Active  pantoprazole 40 mg oral delayed release tablet 1 tab(s) orally once a day Active  Dulera 5 mcg-100 mcg/inh inhalation aerosol 2 puff(s) inhaled 2 times a day Active  potassium chloride 20 mEq oral tablet, extended release 1 tab(s) orally once a day Active  Oxygen 2 liter(s) inhaled , As Needed for O2 levels < 90. Keep on Oxygen at night ciwht CPAP. Active  Flonase 50 mcg/inh nasal spray 1 spray(s) nasal once a day (at bedtime) Active  Spiriva 18 mcg inhalation capsule 1 each inhaled once a day Active   Review of Systems:  General negative   Performance Status (ECOG) 0   Skin negative   Breast negative   Ophthalmologic negative   ENMT negative   Respiratory and Thorax see HPI   Cardiovascular see HPI   Gastrointestinal negative   Genitourinary negative   Musculoskeletal negative   Neurological negative   Psychiatric see HPI   Hematology/Lymphatics see HPI   Endocrine negative   Allergic/Immunologic negative   Nursing Notes:  Nursing Vital Signs and Chemo Nursing Nursing Notes: *CC Vital Signs Flowsheet:   07-May-14 10:28  Temp Temperature 98.1  Pulse Pulse 75  Respirations Respirations 20  SBP SBP 110  DBP DBP 69  Pain Scale (0-10)  0  Current Weight (kg) (kg) 84.1  Height (cm) centimeters 171  BSA (m2) 1.9   Physical Exam:  General/Skin/HEENT:  General normal   Skin normal   Eyes normal   ENMT normal   Head and Neck normal   Additional PE well-developed elderly female in NAD. Oral cavity is clear. Teeth are in a fair state of repair. Indirect mirror examination shows upper airway clear vallecula and base of tongue within normal limits. Neck is clear without evidence of some digastric cervical or supraclavicular adenopathy. Abdomen is  benign. Lungs are clear to A&P cardiac examination shows a faint regular rate and rhythm.   Breasts/Resp/CV/GI/GU:  Respiratory and Thorax normal   Cardiovascular normal   Gastrointestinal normal   Genitourinary normal   MS/Neuro/Psych/Lymph:  Musculoskeletal normal   Neurological normal   Lymphatics normal   Other Results:  Radiology Results: LabUnknown:    07-Apr-14 15:30, CT Neck and Chest with Contrast  PACS Image   CT:  CT Neck and Chest with Contrast   REASON FOR EXAM:    LABS FIRST Rt Vocal Cord Paralysis Hoarseness  COMMENTS:       PROCEDURE: CT  - CT NECK AND CHEST W  - Dec 28 2012  3:30PM     RESULT: History: Focal cord paralysis.    Comparison Study: Chest CT of 08/29/2010. Findings: Standard CT performed   75 cc of Isovue-300. Paranasal sinuses are clear. Orbits are   unremarkable. Mastoids  are clear. External auditory canals are clear.   Base of the skull is normal. The parotid and submandibular glands normal.   Base of the tongue is normal. Soft tissue fullness is present in the   right parapharyngeal region. Direct visualization is suggested to exclude   a mass. Mild soft tissue prominence noted in the left vocal cord region.   This could be from paralysis, direct visualization to exclude a small     nodular mass is suggested. There is a rounded 1.9 cm slight enhancing   mass posterior to the trachea an anterior to the cervical esophagus to   the right of midline. This is most likely a lymph node.    Trachea is widely patent. Large airways are patent within the chest.   Prominent biapical pleural-parenchymal thickening is again noted   consistent with scarring. Diffuse interstitial prominence again noted   most cysts with interstitial fibrosis on today's exam are multiple tiny   nonspecific pulmonary nodules. The largest measures approximately 4 mm   and is seen on image #37 on lung windows. Stable ill-defined density is   noted the left infrahilar a  region. This could represent a region of   scarring. This is unchanged 2011. Mild compression of C7 vertebral   bodynoted. Diffuse degenerative changes noted  cervical thoracic spine.    IMPRESSION:   1. Right parapharyngeal soft tissue prominence. Right promise of the a   vocal cords with nodular thickening on the left.Direct visualization   suggested.  2. 1.9 cm slight enhancing rounded mass in just to the right posterior to   the trachea and anterior to the esophagus at the thoracic inlet best seen   on image #49 on soft tissue windows. This is suspicious for a   retrotracheal lymph node.  3. Multiple prominent nodules. These could represent tiny metastatic   lesions. These are not calcified.    PET CT can be obtained for further evaluation if clinically indicated.        Verified By: Osa Craver, M.D., MD   Relevent Results:   Relevant Scans and Labs CT scan is reviewed and compatible with above-stated findings.   Assessment and Plan: Impression:   recurrent papillary thyroid cancer after 10 years in 81 year old female not amenable to surgery based on multiple comorbidities and location of recurrence Plan:   as stated above case was presented her weekly tumor conference. Based on the area of involvement and her multiple comorbidities patient is not a surgical candidate. Would offer IMRT radiation therapy to the area of recurrence up to 7000 cGy over 7 weeks. Risks and benefits of treatment including irritation of the trachea, possible dysphagia secondary to esophagitis, fatigue, and skin reaction all were explained in detail to the patient and her family. Certainly there is a small chance for tracheal perforation based on the extrinsic compression of the mass on the trachea. Patient will also have a PET/CT scan I have discussed the case personally with Dr. Oliva Bustard who will make a determination based on any other distant disease found on PET/CT. Besides PET/CT findings this area  needs to be treated since it is such close approximation to the esophagus and trachea and will continue close problems down the road if not eradicated. I have set the patient up for CT simulation. Would use IMRT treatment planning and delivery based on the close proximity to spinal cord trachea and esophagus.  I would like to take this opportunity to thank you for allowing  me to continue to participate in this patient's care.  CC Referral:  cc: Dr. Beverly Gust   Electronic Signatures: Baruch Gouty, Roda Shutters (MD)  (Signed 07-May-14 11:34)  Authored: HPI, Diagnosis, Past Hx, PFSH, Allergies, Home Meds, ROS, Nursing Notes, Physical Exam, Other Results, Relevent Results, Encounter Assessment and Plan, CC Referring Physician   Last Updated: 07-May-14 11:34 by Armstead Peaks (MD)

## 2015-01-15 NOTE — H&P (Signed)
PATIENT NAME:  Vanessa Hancock, Vanessa Hancock MR#:  [id number] DATE OF BIRTH:  Aug 22, 1932  DATE OF ADMISSION:  10/08/2011  PRIMARY CARE PHYSICIAN: Casilda Carls, MD   CARDIOLOGIST: Cletis Athens, MD   REQUESTING PHYSICIAN: Dr. Renee Ramus   CHIEF COMPLAINT: Shortness of breath and severe hypocalcemia.   HISTORY OF PRESENT ILLNESS: The patient is a 81 year old female with a known history of hypocalcemia, depression, and chronic atrial fibrillation who is being admitted for chronic obstructive pulmonary disease/congestive heart failure exacerbation and severe electrolyte abnormalities. The patient lives at [facility]. She had a regular follow-up with Dr. Rosario Jacks yesterday and her daughter received a call from their office as the patient's calcium was very low (6) and she was requested to come to the Emergency Department. While in the ED, she was found to be wheezing bilaterally and was hypoxic with room air oxygenation of 83%. She was placed on 3 to 4 liters of oxygen while in the ED. While in the ED talking to her daughter, she indicates the patient retaining more fluid than normally and has been gaining weight. She used to weigh 192 pounds. At Dr. Guerry Bruin office on last visit now she is 196 pounds in the last few weeks. She has also been feeling dyspnea on exertion recently. While in the ED, she was given 2 grams of magnesium sulfate and 1 gram of calcium gluconate due to severe hypomagnesemia and hypocalcemia. She is being admitted for further evaluation and management.   PAST MEDICAL HISTORY:  1. Hypocalcemia secondary to thyroid cancer, status post thyroidectomy and parathyroidectomy.  2. Depression.  3. Chronic atrial fibrillation, not on Coumadin.  4. History of diastolic congestive heart failure. 5. Hypothyroidism.   PAST SURGICAL HISTORY:  1. Thyroidectomy.   2. Parathyroidectomy. 3. Colonoscopy.   ALLERGIES: No known drug allergies.   SOCIAL HISTORY: No smoking and no alcohol use at [facility].   FAMILY HISTORY: Positive for diabetes in mother. Son had carcinoma of the colorectum.   MEDICATIONS WHILE AT THE GROUP HOME:  1. Citalopram 20 mg p.o. daily.  2. Enalapril 2.5 mg p.o. daily.  3. Lipitor 20 mg p.o. daily.  4. Levoxyl 112 mcg p.o. daily.  5. Metoprolol 25 mg p.o. b.i.d.  6. Potassium chloride 10 mEq p.o. b.i.d.  7. Lasix 20 mg p.o. b.i.d., which was recently increased to 40 mg twice a day by Dr. Rosario Jacks.  8. Calcium 600 mg p.o. 4 times a day.  9. Combivent 2 puffs every six hours as needed.  10. Ferrous sulfate 325 mg p.o. daily.  11. Calcitriol 0.25 mcg p.o. daily.  12. Ocuvite once daily. 13. Plavix 75 mg p.o. daily. 14. Alendronate 70 mg p.o. once a week.  15. Omeprazole 20 mg p.o. daily. 16. Aspirin 81 mg p.o. daily.  17. Lanoxin 125 mcg p.o. daily.  18. Tylenol 325 mg p.o. every six hours as needed.  19. Cetirizine 10 mg p.o. daily as needed.  20. Zithromax was started yesterday per Dr. Rosario Jacks 250 mg once a day for five days. She was also recommended to start on prednisone taper which she has not started yet. She has not taken antibiotics yet either.   REVIEW OF SYSTEMS: CONSTITUTIONAL: No fever. Positive for fatigue and weakness along with weight gain. EYES: No blurry or double vision. ENT: No tinnitus or ear pain. RESPIRATORY: Dry cough and wheezing. History of chronic obstructive pulmonary disease along with dyspnea on exertion. CARDIOVASCULAR: No chest pain. Positive for orthopnea and  edema. GI: No nausea, vomiting, or diarrhea. GU: No dysuria or hematuria. ENDOCRINE: Positive for hypothyroidism and hypocalcemia due to thyroidectomy and parathyroidectomy. SKIN: No rash, lesion, or ulcer. HEMATOLOGY: No anemia or easy bruising. MUSCULOSKELETAL: Negative for arthritis. NEUROLOGIC: No tingling, numbness, or weakness. PSYCHIATRIC: No history of anxiety. Positive for depression.   PHYSICAL EXAMINATION:   VITAL SIGNS: Temperature 96.5, heart rate 114  per minute, respirations 24 per minute, blood pressure 108/62 mmHg. She was saturating 83% on room air, 94% on 4 liters oxygen via nasal cannula.   GENERAL: The patient is a 81 year old female lying in bed in minimal respiratory distress.   EYES: Pupils equal, round, reactive to light and accommodation. No scleral icterus. Extraocular muscles intact.   HEENT: Head atraumatic, normocephalic. Oropharynx and nasopharynx clear.   NECK: Supple. No jugular venous distention. No thyromegaly. No tenderness.   LUNGS: Decreased breath sounds at the bases. Rales bilaterally and expiratory wheezing throughout both lungs.   CARDIOVASCULAR: Irregularly irregular heart sounds. Systolic ejection murmur present.   ABDOMEN: Soft, nontender, nondistended. Bowel sounds present. No organomegaly or mass.   EXTREMITIES: 1+ pitting edema bilaterally. No cyanosis or clubbing.   NEUROLOGIC: Nonfocal examination. Cranial nerves III to XII intact. Muscle strength 5 out of 5 in all extremities. Sensation intact.   PSYCHIATRIC: The patient is oriented to time, place, and person x3.   SKIN: No obvious rash, lesion, or ulcer.   LABORATORY, DIAGNOSTIC, AND RADIOLOGICAL DATA: Negative urinalysis. CBC within normal limits except hemoglobin of 10.6, hematocrit 33.0. Normal. Digoxin level. Negative first set of cardiac enzymes with troponin of less than 0.02. Normal liver function tests except AST of 86. BMP within normal limits except BUN of 26, serum calcium of 6.0, magnesium of 1.5. EKG shows atrial fibrillation, rate of 89 per minute. No major ST-T changes. Chest x-ray shows cardiomegaly with underlying fibrotic changes at the lung bases. Mild edema is not excluded.   IMPRESSION AND PLAN:  1. Severe hypocalcemia/hypomagnesemia. She has been given 1 gram of potassium gluconate and 2 grams of magnesium sulfate. Will recheck her electrolytes including repeating ionized calcium. This puts her at a very high risk for  cardiorespiratory arrest and conduction abnormalities including severe cardiac arrhythmias and possibly death. This was explained to the family and patient.  2. Congestive heart failure flare. She does have known history of diastolic heart failure. We will repeat echo. Get strict I's and O's. Obtain daily weights. She likely has acute on chronic respiratory failure secondary to underlying chronic obstructive pulmonary disease/congestive heart failure. We will continue her digoxin. Change Lasix to intravenous. Make it 40 twice a day. Continue ACE inhibitor, beta-blocker, aspirin, and Plavix. Monitor her on off-unit tele. 3. Chronic obstructive pulmonary disease exacerbation. Will start her on IV Solu-Medrol, Levaquin, and nebulizer breathing treatments.  4. Atrial fibrillation, on digoxin, aspirin, and Plavix. She is not on Coumadin. Her rate is controlled. She has been followed by Dr. Lavera Guise. Her digoxin level is within normal limits. If not significant improvement, will consider consulting Dr. Lavera Guise.  5. Known history of iron deficiency anemia. Will continue iron replacement.  6. Hypothyroidism. Will check TSH. Continue Levoxyl.  7. Depression. Will continue citalopram.  8. Hyperlipidemia. Will continue Lipitor.   Total time taking care of this patient (critical care): 55 minutes.   The patient remains at very high risk for cardiac arrhythmias and severe conduction abnormality secondary to severe electrolyte abnormalities with severe hypocalcemia and hypomagnesemia. Will monitor her closely and replete electrolytes aggressively.  ____________________________ Lucina Mellow. Manuella Ghazi, MD vss:drc D: 10/08/2011 09:18:24 ET T: 10/08/2011 09:45:44 ET JOB#: [id number]  cc: Sheilla Maris S. Manuella Ghazi, MD, <Dictator>, Isla Pence, MD Lucina Mellow [facility] MD ELECTRONICALLY SIGNED 10/09/2011 14:11

## 2015-01-15 NOTE — Discharge Summary (Signed)
PATIENT NAME:  Vanessa Hancock, Vanessa Hancock MR#:  [id number] DATE OF BIRTH:  Mar 28, 1932  DATE OF ADMISSION:  10/08/2011 DATE OF DISCHARGE:  10/14/2011  PRIMARY CARE PHYSICIAN: Casilda Carls, MD  ENDOCRINOLOGIST:  Lucilla Lame, MD  DISCHARGE DIAGNOSES:  1. Acute on chronic hypoxic respiratory failure likely due to chronic obstructive pulmonary disease/congestive heart failure exacerbation, now close to baseline, will require home oxygen.  2. Acute on chronic systolic and diastolic heart failure with ejection fraction of 51% with moderate mitral and tricuspid regurgitation, now close to baseline. Lasix dose is reduced considering hypocalcemia and hypotension.  3. Acute chronic obstructive pulmonary disease exacerbation, improving on steroids and nebulizer breathing treatments.  4. Severe hypocalcemia in the setting of hypoparathyroidism, increasing the dose of calcitriol and TUMS, remains asymptomatic.  5. Chronic atrial fibrillation, on aspirin and Plavix. Rate is controlled with beta blocker. Not on Coumadin likely due to fall risk.  6. Hypomagnesemia/hypokalemia, repleted and resolved.  7. Hypothyroidism, on Synthroid with normal TSH.  8. Chronic anemia, on iron supplement and hemodynamically stable.  9. Hypotension, likely due to medication affect. The patient remains asymptomatic.  10. Hypoxia, likely due to congestive heart failure/chronic obstructive pulmonary disease, will require 1 liter oxygen at home continuous.   SECONDARY DIAGNOSES:  1. Hypocalcemia secondary to thyroid cancer. 2. Depression.  3. Chronic atrial fibrillation. 4. History of diastolic heart failure.  5. Hypothyroidism.    CONSULTANTS:  1. Lucilla Lame, MD - Endocrine. 2. Physical Therapy.   PROCEDURES/RADIOLOGY: Chest x-ray on 10/08/2011 showed cardiomegaly with underlying fibrotic changes, basilar atelectasis bilaterally.   Chest x-ray on 10/08/2011 at 8:47 showed interstitial pulmonary opacities representing pulmonary  edema.   Chest x-ray on 10/10/2011 showed borderline to mild cardiomegaly. Chronic obstructive pulmonary disease with fibrotic changes.   2-D echocardiogram on 10/08/2011 showed small pericardial effusion. Good LV function. Calcified aortic valve. Mildly dilated right ventricle. Borderline left atrial enlargement. Moderate mitral regurgitation. Moderate tricuspid regurgitation. Ejection fraction of 50%.   MAJOR LABORATORY PANEL: Urinalysis on admission was negative.   Ionized calcium on 10/08/2011 at 4:23 was 3.1. Repeat ionized calcium on 10/08/2011 at 9:07 was less than 3. Ionized calcium on 10/10/2011 was less than 3.  HISTORY AND SHORT HOSPITAL COURSE: The patient is a 81 year old female with the above-mentioned medical problems who was admitted for severe hypocalcemia and hypomagnesemia. This was aggressively repleted. She was also thought to have CHF/COPD exacerbation. She was evaluated by endocrine, Dr. Gabriel Carina, who agreed with aggressive repletion of electrolytes. She also underwent 2-D echocardiogram to evaluate her heart function and her Echo report has been dictated above. Essentially she had a good ejection fraction of 51% with moderate mitral and tricuspid regurgitation. She was found to have possible acute on chronic systolic and diastolic heart failure and was on Lasix. Her Lasix dose was slowly reduced as she was consistently staying hypocalcemic and hypotensive, which she agreed on. She was also thought to have possible acute chronic obstructive pulmonary disease exacerbation and was started on steroids and nebulizer breathing treatments and her breathing was much improved. She was evaluated by physical therapy and was doing much better. On 10/14/2011 her calcium still remained low. I had an extensive discussion with endocrine physician, Dr. Gabriel Carina, along with the patient's primary care physician, Dr. Rosario Jacks, who felt as the patient is asymptomatic this could be monitored as an outpatient and  she was discharged home in stable condition.   DISCHARGE PHYSICAL EXAMINATION:  VITALS: On the day of discharge, her temperature was 98.2,  heart rate 86 per minute, respirations 20 per minute, blood pressure 125/79 mmHg, and she was saturating 84% on room air with exertion and 91% on 1 liter oxygen via nasal cannula, which was set up for her to use at home.   DISCHARGE MEDICATIONS:  1. Metoprolol 25 mg p.o. twice a day. 2. Lanoxin 125 mcg p.o. daily.  3. Levoxyl 112 mcg p.o. daily.  4. Fosamax 70 mg p.o. once a week.  5. Omeprazole 20 mg p.o. daily.  6. Enalapril 2.5 mg p.o. daily. 7. Escitalopram 20 mg p.o. daily.  8. Tylenol 325 mg p.o. daily as needed.  9. Cetirizine 10 mg p.o. daily as needed.  10. Plavix 75 mg p.o. daily.  11. Iron sulfate 325 mg p.o. daily. 12. Lipitor 20 mg p.o. daily.  13. Ocuvite 1 capsule daily.  14. Calcitriol 1 mcg p.o. daily.  15. Calcium carbonate (Tums 300 mg p.o. twice a day). 16. Lasix 20 mg p.o. daily.  17. Prednisone 20 mg p.o. daily, taper by 10 mg daily until finished.  18. Combivent 2 puffs every six hours as needed.  19. Aspirin 81 mg p.o. daily.  20. Klor-Con 10 mEq p.o. twice a day.   DISCHARGE DIET: Low sodium and no salt to be added.   DISCHARGE ACTIVITY: As tolerated.   DISCHARGE INSTRUCTIONS/FOLLOWUP: The patient was instructed to follow-up with her primary care physician, Dr. Rosario Jacks, in 1 to 2 weeks. She was also instructed to follow-up with Dr. Gabriel Carina if felt appropriate by her primary care physician for evaluation of her electrolytes and thyroid. She will need calcium and magnesium check in one week with results forwarded to primary care physician and Dr. Gabriel Carina. She was set up to get 1 liter oxygen via nasal cannula continuous at home due to her hypoxia.   DISPOSITION: The patient is being discharged to a family care home.  TOTAL TIME DISCHARGING PATIENT: 55 minutes. ____________________________ Lucina Mellow. Manuella Ghazi,  MD vss:slb D: 10/15/2011 15:11:08 ET T: 10/16/2011 11:54:21 ET JOB#: [id number]  cc: Anessa Charley S. Manuella Ghazi, MD, <Dictator> Isla Pence, MD A. Lavone Orn, MD Remer Macho MD ELECTRONICALLY SIGNED 10/16/2011 23:14

## 2015-01-15 NOTE — Discharge Summary (Signed)
PATIENT NAME:  Vanessa Hancock, Vanessa Hancock MR#:  [id number] DATE OF BIRTH:  Jan 05, 1932  DATE OF ADMISSION:  01/18/2012 DATE OF DISCHARGE:  01/20/2012  ADMITTING DIAGNOSIS: Systemic antiinflammatory response reaction due to pneumonia as well as urinary tract infection.   DISCHARGE DIAGNOSES:  1. Systemic antiinflammatory response reaction. 2. Urinary tract infection, Proteus. 3. Suspected right lower lobe pneumonia.  4. Pulmonary edema likely due to fluid overload received in the hospital. Possible mild congestive heart failure, acute on chronic, diastolic. 5. Chronic obstructive pulmonary disease exacerbation due to pneumonia. 6. Hypoxia suspected due to chronic obstructive pulmonary disease.  7. Hyperglycemia with intact PTH, likely alimentary due to high doses of calcium supplements.  8. Hypokalemia. 9. Hypomagnesemia, replenished.  10. Generalized weakness.  11. Hyperglycemia with hemoglobin A1c 6.9, questionable diabetes.  12. History of atrial fibrillation, chronic, not on anticoagulation.  13. History of thyroid cancer with hypocalcemia in the past.  14. History of hypothyroidism. 15. History of congestive heart failure left heart, chronic, diastolic.  16. Chronic respiratory failure, on home oxygen therapy.   DISCHARGE CONDITION: Fair.   DISCHARGE MEDICATIONS: The patient is to resume her outpatient medications. 1. Metoprolol 25 mg p.o. twice daily.  2. Lanoxin 125 mcg p.o. daily.  3. Fosamax 70 mg p.o. weekly.  4. Enalapril 2.5 mg p.o. daily.  5. Escitalopram 20 mg p.o. daily.  6. Cetirizine 10 mg p.o. daily as needed.  7. Aspirin 81 mg p.o. daily.  8. Plavix 75 mg p.o. daily.  9. Iron sulfate 325 mg p.o. daily.  10. Lipitor 20 mg p.o. daily.  11. Klor-Con 10 milliequivalents p.o. twice daily.  12. Lasix 20 mg p.o. daily.  13. Tylenol 325 mg p.o. every six hours as needed.  14. Combivent 2 puffs every six hours as needed.   ADDITIONAL MEDICATIONS:  1. Keflex 250 mg p.o. three  times daily for 8 more days to complete a 10-day course for Proteus mirabilis urinary tract infection.  2. Zithromax 250 mg p.o. daily for four more days to complete a five-day course for pneumonia.  3. Prednisone 60 mg p.o. once, on 01/21/2012, then taper x 10 mg daily until stopped.  4. Ocuvite 1 tablet p.o. daily.  5. Protonix 40 mg p.o. daily.  6. Colace 100 mg p.o. twice daily.  7. MiraLax 17 grams p.o. daily dissolved in 8 ounces of water or juice as needed.  8. Digoxin is not supposed to be 125 mcg p.o. daily but is supposed to be 125 mcg p.o. every second day. This is new dosing. 9. DuoNebs 3 mL every four hours as needed.  10. Vitamin D3 1,000 units p.o. daily. This is an over-the-counter medication. 11. Tums/calcium carbonate 500 mg p.o. three times daily, also over-the-counter.   HOME OXYGEN: 2 liters of oxygen through nasal cannula 24/7.   DIET: 1800 ADA, low fat, no added salt.   ACTIVITY LIMITATIONS: As tolerated.   REFERRAL: Home health physical therapy as well as Therapist, sports. The patient was advised to ambulate with front-wheel rolling walker, per physical therapy.   DISCHARGE FOLLOWUP: The patient is to follow up with Dr. Rosario Jacks in two days after discharge.  CONSULTANTS: Care Management.   RADIOLOGIC STUDIES: Chest PA and lateral, on 01/17/2012, showed left lower lobe hazy airspace disease in the costophrenic angle concerning for atelectasis versus pneumonia.   Repeated chest x-ray, PA and lateral, on 01/20/2012, showed increased density in the right base likely secondary to edema. There is blunting of posterior gutters compatible with trace  effusion. Stable cardiomegaly. Continued followup was recommended.   HISTORY OF PRESENT ILLNESS: The patient is a 81 year old Caucasian female with past medical history significant for history of congestive heart failure as well as hypocalcemia due to thyroid cancer status post thyroidectomy as well as parathyroidectomy, history of chronic  atrial fibrillation not on anticoagulation, history of diastolic dysfunction, and congestive heart failure who presented to the hospital with complaints of elevated calcium levels. Please refer to Dr. Brien Few Phichith's admission note on 01/18/2012.   On arrival to the emergency room, the patient's temperature was 97.8, pulse 72, respiratory rate 20, blood pressure 111/56, and saturation was 90% on room air. Physical exam revealed faint basilar crackles, otherwise unremarkable physical examination.   Lab data showed glucose of 111, BUN of 25, potassium 3.4, bicarbonate 33, estimated GFR was found to be 27 for non-African American, and calcium level was markedly elevated at 12.9. The patient's total protein was also elevated at 8.8. Cardiac enzymes x3 were unremarkable. Digoxin level was 1.0. White blood cell count was normal at 9.6, hemoglobin 11.0, and platelet count 304. Urinalysis showed yellow cloudy urine, negative for glucose, bilirubin or ketones, specific gravity 1.012, pH 6.0, negative for blood, 30 mg/dL protein, negative for nitrites, 3+ leukocyte esterase, 6 red blood cells, 112 white blood cells, trace bacteria, 18 epithelial cells, and white blood cell clumps as well as mucus was present. Urine cultures revealed more than 100,000 colony-forming units of Proteus mirabilis sensitive to trimethoprim sulfamethoxazole, cefazolin, and ampicillin, as well as ceftriaxone, gentamicin, and ceftazidime as well as imipenem. However, it was resistant to ciprofloxacin as well as levofloxacin.  HOSPITAL COURSE:  The patient admitted to the hospital. She was started on Rocephin as well as Zithromax for her pneumonia as well as urinary tract infection. She was also started on IV fluids and her Lasix was placed on hold because of concern of dehydration.   In regards to systemic antiinflammatory response reaction, it was felt that the patient's SIRS was related to pneumonia as well as possibly urinary tract  infection. Urinary tract infection cultures came back positive for Proteus mirabilis. The patient is to continue antibiotic therapy with Keflex for 8 more days to complete a 10 day course.   In regards to pneumonia, the patient did have atelectasis bilaterally; however, pneumonia was also suspected due to diminished breath sounds and some hypoxia. The patient is to continue antibiotic therapy with Zithromax for the next four days to complete a five-day course. She received Rocephin as mentioned above while she was in the hospital. It was felt that the patient's tight lung sounds were representing likely an element of congestive heart failure due to fluid overload as well as possibly chronic obstructive pulmonary disease exacerbation. The patient was advised to continue prednisone therapy as well as Lasix. She is to follow up with her primary care physician for further recommendations. She is to continue oxygen for acute on chronic respiratory failure. No echocardiogram was performed during this admission, however.    In regards to hypercalcemia, the patient's intact PTH was checked and was found to be low. It was felt that the patient's hypercalcemia was very likely related to alimentary calcium as well as vitamin D overload. The patient was receiving IV fluids and the patient's calcium level normalized. By the day of discharge, 01/20/2012, the patient's calcium level is 9.5.   The patient was noted to be hypomagnesemic as well as hypokalemic. Those micro elements were supplemented and the patient's potassium as well  as magnesium level normalized upon discharge. It is recommended to follow the patient's potassium as well as magnesium levels as an outpatient especially now since she is back on Lasix.  In regards to generalized weakness, the patient was seen by a physical therapist and she did satisfactory. She is being discharged with home health physical therapy which will be set up for her upon discharge.  She also will be seeing a registered nurse who is going to follow her oxygenation as well as her blood pressure and she will also check her overall wellbeing and make decisions about her medication management.   The patient was noted to be hyperglycemic. Hemoglobin A1c was checked and was found to be 6.9. It was felt that the patient could have prediabetes. It is recommended to follow the patient's glucose levels as an outpatient and make decisions about therapy if needed. Meanwhile, the patient will be started on an 1800 ADA diet.   In regards to atrial fibrillation, the patient remained in atrial fibrillation with controlled rate. She is to continue metoprolol. She is not on anticoagulation, however, she is to continue aspirin as well as Plavix to decrease risks of thrombosis.   In regards to history of hyperparathyroidism, the patient remains still hyperparathyroid. She is to continue calcium supplements as well as vitamin D supplements. It is recommended to follow the patient's calcium levels as an outpatient.   For hypothyroidism, the patient is not on any thyroid supplement. She is to follow up with her primary care physician, Dr. Casilda Carls, for recommendations.  For hyperlipidemia, she is to continue her Lipitor.   For congestive heart failure, the patient is to continue ACE inhibitor as well as Lasix with potassium supplements.   For chronic obstructive pulmonary disease exacerbation, the patient is to continue her prednisone taper as well as antibiotic therapy with Zithromax.  For history of current history of urinary tract infection, Proteus mirabilis, the patient is to continue Keflex for 8 more days, as mentioned above, to complete the course.   The patient is being discharged in stable condition with the above-mentioned medications and follow-up. Her vital signs on the day of discharge: Temperature 98.3, pulse is ranging from 70s to 100, respiration rate 20, blood pressure 123456 to  A999333 systolic and 0000000 to [id number] diastolic, and oxygen saturation is 93 to 94% on 2 liters of oxygen at rest as well as on exertion.   TIME SPENT: 40 minutes.  ____________________________ Theodoro Grist, MD rv:slb D: 01/20/2012 15:28:28 ET T: 01/21/2012 10:45:57 ET JOB#: [id number]  cc: Theodoro Grist, MD, <Dictator> Isla Pence, MD Pamelia Center MD ELECTRONICALLY SIGNED 02/01/2012 14:15

## 2015-01-15 NOTE — H&P (Signed)
PATIENT NAME:  Vanessa Hancock, Vanessa Hancock MR#:  [id number] DATE OF BIRTH:  1932/06/01  DATE OF ADMISSION:  01/18/2012  REFERRING PHYSICIAN: Dr. Beather Arbour.   PRIMARY CARE PHYSICIAN: Dr. Rosario Jacks.   PRESENTING COMPLAINT: Sent over per recommendation of Dr. Rosario Jacks with elevated calcium levels.   HISTORY OF PRESENT ILLNESS: Vanessa Hancock is a 81 year old woman with history of hypocalcemia secondary to thyroid cancer, status post thyroidectomy and parathyroidectomy, chronic atrial fibrillation, on aspirin and Plavix, diastolic dysfunction, congestive heart failure, hypothyroidism, newly diagnosed obstructive sleep apnea who presents per recommendation of Dr. Rosario Jacks for evaluation of hypercalcemia. On evaluation the patient endorses weakness and her daughter also reports some mild confusion starting since Sunday. It appears that she has been sluggish and was having difficulty doing her usual routines including eating. The patient denies any chest pain or shortness of breath. No wheezing. No orthopnea. It appears that she uses oxygen only as needed when looking at documentations. She has been using at least 1 liter this past month drop due to drops in her oxygen level. She was recently evaluated with sleep study and was tested positive for obstructive sleep apnea. The patient denies any falls, presyncope, or syncope.   PAST MEDICAL HISTORY:  1. Admitted in January 2013 for acute on chronic hypoxic respiratory failure in the setting of chronic obstructive pulmonary disease plus/minus congestive heart failure exacerbation.  2. Valvular heart disease with echo from January 2013 revealing for ejection fraction of 51%, small pericardial effusion, moderate MR and TR and right atrium moderately dilated.  3. History of hypocalcemia secondary to thyroid cancer, status post thyroidectomy and parathyroidectomy.  4. Depression.  5. Chronic atrial fibrillation, not on Coumadin.  6. Diastolic dysfunction, congestive heart failure.   7. Hypothyroidism.  8. Chronic anemia.  9. Obstructive sleep apnea, still being measured for CPAP.   PAST SURGICAL HISTORY:  1. Thyroidectomy.  2. Parathyroidectomy.  3. Appendectomy.  4. Tonsillectomy.   ALLERGIES: No known drug allergies.   MEDICATIONS:  1. Calcitriol and TUMS, but was advised to be held this evening.  2. Cipro 500 mg b.i.d. for seven days. She is status post four doses.  3. Lipitor 20 mg daily.  4. Levoxyl 125 mcg daily.  5. Calcitriol 0.5 mcg 2 capsules daily.  6. Ferrous sulfate 325 mg daily.  7. Furosemide 20 mg daily.  8. Aspirin 81 mg daily.  9. Enalapril 2.5 mg daily.  10. Ocuvite tablet with lutein 1 tablet daily.  11. Plavix 75 mg daily.  12. Fosamax 70 mg weekly.  13. Omeprazole 20 mg daily.  14. Lanoxin 125 mcg daily.  15. Lexapro 20 mg daily.  16. Potassium chloride 10 mEq b.i.d.  17. TUMS ultra tablet 3 tablets b.i.d.  18. Metoprolol 25 mg b.i.d.  19. Zyrtec 10 mg daily as needed.  20. Acetaminophen 325 mg 1 tablet every six hours as needed.  21. Combivent inhaler 2 puffs every six hours as needed.  22. O2 as needed.  23. Zolpidem ER 6.25 mg by mouth at bedtime as needed.   SOCIAL HISTORY: She resides at [facility] group home. She quit tobacco 2 to 3 years ago. No alcohol use.   FAMILY HISTORY: Mother with diabetes. Son had colorectal cancer.   REVIEW OF SYSTEMS: CONSTITUTIONAL: No fevers or chills. EYES: No glaucoma or cataracts. ENT: No epistaxis or discharge. No dysphagia or odynophagia. RESPIRATORY: Denies any wheezing or hemoptysis. Denies any shortness of breath. CARDIOVASCULAR: No chest pain, orthopnea, palpitations, or syncope. GASTROINTESTINAL: No nausea,  vomiting, diarrhea, abdominal pain, or hematemesis. GU: No dysuria or hematuria. ENDOCRINE: No polyuria or polydipsia. HEMATOLOGIC: No easy bleeding. SKIN: No ulcers. MUSCULOSKELETAL: No neck pain, back pain, or joint swelling. NEUROLOGIC: No one-sided weakness or numbness. PSYCH:  Denies any depression or suicidal ideation.   PHYSICAL EXAMINATION:  VITAL SIGNS: Temperature 97.8, pulse 72, respiratory rate 20, blood pressure 111/56, sating at 90% on room air.   GENERAL: Lying in bed in no apparent distress.   HEENT: Normocephalic, atraumatic. Pupils dilated and symmetric, anicteric. Nares with nasal cannula in place. Moist mucous membrane.   NECK: Soft and supple. No adenopathy. No JVP.   CARDIOVASCULAR: Non-tachy, irregular. No murmurs, rubs, or gallops.   LUNGS: Faint basilar crackles. No use of accessory muscles or increased respiratory effort.   ABDOMEN: Soft. Positive bowel sounds. No mass appreciated.   EXTREMITIES: Trace edema bilaterally. Dorsal pedis pulses intact.   MUSCULOSKELETAL: No joint effusion.   SKIN: No ulcers.   NEUROLOGIC: No dysarthria or aphasia. Symmetrical strength. No focal deficits.   PSYCH: She is alert and oriented. The patient is cooperative.   PERTINENT LABS AND STUDIES: WBC 9.6, hemoglobin 11, hematocrit 34.1, platelets 304, MCV 81. CK 109. MB 1.5, glucose 111, BUN 25, creatinine 1.13, sodium 137, potassium 3.4, chloride 96, carbon dioxide 33, calcium 12.9, albumin 3.5, total protein 8.8. LFTs within normal limits. Troponin less than 0.02. Urinalysis with specific gravity of 1.012, pH 6, protein 30 mg/dL, 3+ leukocyte esterase, negative nitrite, RBC 6 per high-power field, WBC 112 per high-power field, trace bacteria. Chest PA and lateral with left lower lobe hazy air space disease in the costophrenic angle concerning for atelectasis versus pneumonia. EKG with atrial fibrillation, rate of 70. No ST elevation or depression.   ASSESSMENT AND PLAN: Vanessa Hancock is a 81 year old woman with history of thyroid cancer status post resection, resultant hypocalcemia, depression, atrial fibrillation, diastolic dysfunction congestive heart failure, obstructive sleep apnea, hypothyroidism, sent with elevated calcium levels and with reports of  weakness and confusion.  1. Hypercalcemia, likely with mild dehydration and supplementation. Will continue with gentle fluid repletion. Given her history of congestive heart failure, continue to monitor ins and outs and daily weights. Will stop fluids in 24 hours and reassess. Will hold Tums and calcitriol. Restart Lasix.  2. Possible pneumonia. With her hypoxia, could be related to her sleep apnea, but given her history we will continue to treat with ceftriaxone and azithromycin. Will not start on Solu-Medrol as I do not appreciate any wheezing on examination. Continue with SVNs standing order and continue with oxygen supplementation.  3. Urinary tract infection. She has completed Cipro and was reinitiated on Cipro given ongoing dirty urine. Will stop her ciprofloxacin and cover with ceftriaxone for now until her urine cultures return with sensitivities.  4. Hypokalemia. Will increase her potassium supplementation.  5. Atrial fibrillation, on digoxin, metoprolol, aspirin, and Plavix. We will get a digoxin level. Her weakness is likely related to her dehydration and hypercalcemia plus/minus pneumonia, but will make sure that her dig. level is not toxic.  6. Prophylaxis. Aspirin, Plavix, omeprazole and Lovenox.   TIME SPENT: Approximately 50 minutes were spent on patient care.   ____________________________ Rita Ohara, MD ap:ap D: 01/18/2012 01:53:26 ET T: 01/18/2012 08:44:36 ET JOB#: [id number] cc: Brien Few Trygg Mantz, MD, <Dictator> Isla Pence, MD  Rita Ohara MD ELECTRONICALLY SIGNED 01/22/2012 22:18

## 2015-01-15 NOTE — Consult Note (Signed)
PATIENT NAME:  Vanessa Hancock, Vanessa Hancock MR#:  [id number] DATE OF BIRTH:  [birth year]  DATE OF CONSULTATION:  10/11/2011  REFERRING PHYSICIAN:  Dagoberto Ligas, MD  CONSULTING PHYSICIAN:  A. Lavone Orn, MD  CHIEF COMPLAINT: Hypocalcemia.   HISTORY OF PRESENT ILLNESS: This is a 81 year old female seen in consultation for hypocalcemia. She underwent thyroidectomy around 2005 for thyroid cancer and has had subsequently persistently difficult to control hypocalcemia. She was hospitalized in 2005 for hypercalcemia when her calcium was 16. She was hospitalized in 2010 for hypocalcemia and she was admitted here on January 15th for persistent hypocalcemia with a calcium of just 6.0. Her outpatient regimen includes TUMS (calcium carbonate 500 mg) 1 tab q.i.d. and calcitriol 0.25 mcg daily. She may have been symptomatic with cramps, although she cannot recall. Her daughter was at the bedside and we called another daughter while I was in the room for further history. The patient is a poor historian. She underwent thyroid surgery back in 2005 by a surgeon who is no longer in the area. They believe that surgery was done here at [facility], however, I was unable to find any notes or surgical reports in her record. I additionally could not find any pathology reports. She was treated with radioiodine ablation after her surgery which was done at [facility]. She has been following with Dr. Rosario Jacks who has been monitoring her for recurrence. She does take levothyroxine 112 mcg daily and her most recent TSH on 10/08/2011 was normal at 1.10. At this time she denies perioral paresthesias or myalgias or any cramps. She has a history of osteoporosis and has had fractures in the past including a clavicle fracture and mandibular fracture after falls. She lives at a group home where her medications are managed by the staff. She cannot recall her medications and she was unable to endorse compliance. Since admission she has received her  outpatient medications in addition to daily magnesium supplementation with 400 mg b.i.d. and several doses of IV calcium, however, her calcium has remained in the range of 6.1 to 6.3. She is eating a full diet. She reports good appetite.   PAST MEDICAL HISTORY:  1. Thyroid cancer status post thyroidectomy.  2. Hypocalcemia due to postoperative hypoparathyroidism.  3. Postsurgical hypothyroidism.  4. Atrial fibrillation.  5. Chronic obstructive pulmonary disease.  6. Congestive heart failure.  7. Osteoporosis.  8. Depressive disorder.   ALLERGIES: No known drug allergies.  SOCIAL HISTORY: She resides at the [facility]. She does not smoke although she has a history of heavy tobacco use.   FAMILY HISTORY: No known calcium or bone disorders.   CURRENT INPATIENT MEDICATIONS:  1. Calcitriol 0.25 mg daily.  2. Calcium carbonate 500 mg q.i.d.  3. Lipitor 20 mg daily.  4. Vitamin D3 1000 units daily.  5. Celexa 20 mg daily.  6. Digoxin 0.125 mg daily.  7. Vasotec 2.5 mg daily.  8. Iron 325 mg daily.  9. Lasix 20 mg b.i.d.  10. Levaquin 750 mg q.48 hours.  11. Methylprednisolone 60 mg q.6 hours.  12. Lopressor 25 mg b.i.d.  13. Pantoprazole 40 mg daily.  14. Aspirin 81 mg daily.  15. Plavix 75 mg daily.  16. Albuterol nebulizer q.4 hours while awake.   REVIEW OF SYSTEMS: GENERAL: Denies weight loss. Denies fevers. HEENT: Denies blurred vision. Denies sore throat. CARDIAC: Denies chest pain. Denies palpitation. PULMONARY: Denies cough. She does have shortness of breath. She does have orthopnea. ABDOMEN: Reports good appetite. Denies  nausea. SKIN: Denies rash or pruritus. PSYCHIATRIC: Denies forgetfulness. Mood is stable. HEME: Denies easy bruisability or recent bleeding. ENDOCRINE: Denies heat or cold intolerance.   PHYSICAL EXAMINATION:   VITAL SIGNS: Temperature 97.7, pulse 107, respiratory rate 20, blood pressure 124/76, pulse oximetry 91% on 3 liters O2.   GENERAL: White  female, elderly, in no distress.   HEENT: Extraocular movements are intact. Oropharynx is clear. Mucous membranes moist.   NECK: Supple. Thyroid surgically absent, well healed necklace scar.   LYMPH: No submandibular or anterior cervical lymphadenopathy.   CARDIAC: Irregular irregular.   PULMONARY: Clear. Decreased breath sounds at the bases. Normal respiratory effort.   ABDOMEN: Soft, nontender, nondistended.   EXTREMITIES: Trace edema bilaterally. No cyanosis or clubbing.   SKIN: No rash or dermatopathy noted.   NEUROLOGIC: Cognitively intact. No dysarthria.   PSYCHIATRIC: Alert and oriented x3.   LABORATORY, DIAGNOSTIC, AND RADIOLOGICAL DATA: Glucose 149, BUN 33, creatinine 0.89, sodium 139, potassium 3.8, chloride 90, CO2 39, calcium 6.3, hematocrit 32.2%, WBC 8.4, platelets 251.   ASSESSMENT:  1. This is a 81 year old female with persistent hypocalcemia due to hypoparathyroidism since undergoing thyroid surgery in 2005.  2. Thyroid cancer in 2005 apparently with no evidence of recurrence. 3. Osteoporosis likely due in part to hypocalcemia.   RECOMMENDATIONS:  1. Increase her calcium carbonate from 2000 mg per day to 4000 mg per day. To simplify dosing, will give 2000 mg b.i.d.  2. Increase calcitriol to 0.5 mcg daily.  3. I will arrange for outpatient follow-up and at that time can obtain outside records and review prior cancer surveillance screening history. I suspect she will be safe for discharge once her serum calcium is above 7 which should be in the next 24 to 48 hours. I will arrange follow-up in about two weeks.   I will be unavailable over the weekend, however, should she remain in-house on January 21st, I will see her at that time. Thank you for the kind request for consultation.   ____________________________ A. Lavone Orn, MD ams:drc D: 10/11/2011 13:08:59 ET T: 10/11/2011 13:37:37 ET JOB#: [id number]  cc: A. Lavone Orn, MD, <Dictator> Sherlon Handing  MD ELECTRONICALLY SIGNED 10/14/2011 18:39

## 2015-02-17 ENCOUNTER — Other Ambulatory Visit: Payer: Self-pay | Admitting: *Deleted

## 2015-02-17 DIAGNOSIS — C73 Malignant neoplasm of thyroid gland: Secondary | ICD-10-CM

## 2015-02-23 ENCOUNTER — Ambulatory Visit: Payer: Self-pay | Admitting: Oncology

## 2015-02-23 ENCOUNTER — Other Ambulatory Visit: Payer: Self-pay

## 2015-03-09 ENCOUNTER — Other Ambulatory Visit: Payer: Self-pay | Admitting: Oncology

## 2015-03-09 ENCOUNTER — Encounter: Payer: Self-pay | Admitting: Radiation Oncology

## 2015-03-09 ENCOUNTER — Ambulatory Visit
Admission: RE | Admit: 2015-03-09 | Discharge: 2015-03-09 | Disposition: A | Discharge status: Home / Self Care | Payer: Medicare PPO | Source: Ambulatory Visit | Attending: Radiation Oncology | Admitting: Radiation Oncology

## 2015-03-09 VITALS — BP 123/67 | HR 71 | Resp 18 | Wt 169.9 lb

## 2015-03-09 DIAGNOSIS — C73 Malignant neoplasm of thyroid gland: Secondary | ICD-10-CM

## 2015-03-09 HISTORY — DX: Malignant (primary) neoplasm, unspecified: C80.1

## 2015-03-09 NOTE — Progress Notes (Signed)
Radiation Oncology Follow up Note  Name: Vanessa Hancock   Date:   03/09/2015 MRN:  [id number] DOB: 22-Jul-1932    This 81 y.o. female presents to the clinic today for recurrent papillary thyroid cancer  REFERRING PROVIDER: No ref. provider found  HPI: Patient is a 81 year old now out over 2 years having completed radiation therapy for recurrent papillary thyroid cancer presenting with right vocal cord paralysis. She had 12 years ago total thyroidectomy for papillary thyroid cancer. She has multiple comorbidities including atrial fibrillation, congestive heart failure. We treated with IM RT radiation therapy to 7000 cGy to that region. Repeat PET/CT scan back in February 2016 still shows some residual hypermetabolic activity in that area without significant compression of the trachea. She specifically denies cough head and neck pain dysphasia.  COMPLICATIONS OF TREATMENT: none  FOLLOW UP COMPLIANCE: keeps appointments   PHYSICAL EXAM:  BP 123/67 mmHg  Pulse 71  Resp 18  Wt 169 lb 13.8 oz (77.05 kg) Oral cavity is clear no oral mucosal lesions are identified. Indirect mirror examination shows upper airway clear vallecula and base of tongue within normal limits. Neck is clear without evidence of subject gastric cervical or supraclavicular adenopathy. Cannot detect anything in her supraclavicular fossa is. Well-developed well-nourished patient in NAD. HEENT reveals PERLA, EOMI, discs not visualized.  Oral cavity is clear. No oral mucosal lesions are identified. Neck is clear without evidence of cervical or supraclavicular adenopathy. Lungs are clear to A&P. Cardiac examination is essentially unremarkable with regular rate and rhythm without murmur rub or thrill. Abdomen is benign with no organomegaly or masses noted. Motor sensory and DTR levels are equal and symmetric in the upper and lower extremities. Cranial nerves II through XII are grossly intact. Proprioception is intact. No peripheral  adenopathy or edema is identified. No motor or sensory levels are noted. Crude visual fields are within normal range.   RADIOLOGY RESULTS: February PET CT scan is reviewed  PLAN: At this time would continue to observe the patient since she is asymptomatic with probable small area of residual thyroid cancer in previously radiated region. She continues close follow-up care with medical oncology. I have asked to see her back in 1 year for follow-up. She and her family know to call with any concerns.  I would like to take this opportunity for allowing me to participate in the care of your patient.Armstead Peaks., MD

## 2015-03-22 ENCOUNTER — Inpatient Hospital Stay: Payer: Medicare PPO | Attending: Oncology | Admitting: Oncology

## 2015-03-22 ENCOUNTER — Inpatient Hospital Stay: Payer: Medicare PPO

## 2015-03-22 VITALS — BP 114/69 | HR 88 | Temp 94.5°F | Wt 170.4 lb

## 2015-03-22 DIAGNOSIS — E871 Hypo-osmolality and hyponatremia: Secondary | ICD-10-CM | POA: Insufficient documentation

## 2015-03-22 DIAGNOSIS — C73 Malignant neoplasm of thyroid gland: Secondary | ICD-10-CM

## 2015-03-22 DIAGNOSIS — Z87891 Personal history of nicotine dependence: Secondary | ICD-10-CM | POA: Insufficient documentation

## 2015-03-22 DIAGNOSIS — I4891 Unspecified atrial fibrillation: Secondary | ICD-10-CM | POA: Insufficient documentation

## 2015-03-22 DIAGNOSIS — J449 Chronic obstructive pulmonary disease, unspecified: Secondary | ICD-10-CM

## 2015-03-22 DIAGNOSIS — Z79899 Other long term (current) drug therapy: Secondary | ICD-10-CM | POA: Diagnosis not present

## 2015-03-22 DIAGNOSIS — Z809 Family history of malignant neoplasm, unspecified: Secondary | ICD-10-CM | POA: Insufficient documentation

## 2015-03-22 DIAGNOSIS — F319 Bipolar disorder, unspecified: Secondary | ICD-10-CM | POA: Diagnosis not present

## 2015-03-22 DIAGNOSIS — Z923 Personal history of irradiation: Secondary | ICD-10-CM | POA: Insufficient documentation

## 2015-03-22 DIAGNOSIS — Z7982 Long term (current) use of aspirin: Secondary | ICD-10-CM | POA: Diagnosis not present

## 2015-03-22 LAB — TSH: TSH: 1.064 u[IU]/mL (ref 0.350–4.500)

## 2015-03-22 LAB — COMPREHENSIVE METABOLIC PANEL
ALT: 16 U/L (ref 14–54)
AST: 24 U/L (ref 15–41)
Albumin: 3.9 g/dL (ref 3.5–5.0)
Alkaline Phosphatase: 50 U/L (ref 38–126)
Anion gap: 7 (ref 5–15)
BUN: 29 mg/dL — ABNORMAL HIGH (ref 6–20)
CHLORIDE: 96 mmol/L — AB (ref 101–111)
CO2: 31 mmol/L (ref 22–32)
CREATININE: 1.13 mg/dL — AB (ref 0.44–1.00)
Calcium: 7.7 mg/dL — ABNORMAL LOW (ref 8.9–10.3)
GFR calc Af Amer: 51 mL/min — ABNORMAL LOW (ref >60)
GFR, EST NON AFRICAN AMERICAN: 44 mL/min — AB (ref >60)
Glucose, Bld: 117 mg/dL — ABNORMAL HIGH (ref 65–99)
Potassium: 4.6 mmol/L (ref 3.5–5.1)
SODIUM: 134 mmol/L — AB (ref 135–145)
Total Bilirubin: 0.5 mg/dL (ref 0.3–1.2)
Total Protein: 7.3 g/dL (ref 6.5–8.1)

## 2015-03-22 LAB — CBC WITH DIFFERENTIAL/PLATELET
Basophils Absolute: 0.1 10*3/uL (ref 0–0.1)
Basophils Relative: 1 %
EOS ABS: 0.3 10*3/uL (ref 0–0.7)
Eosinophils Relative: 5 %
HCT: 33.5 % — ABNORMAL LOW (ref 35.0–47.0)
Hemoglobin: 10.7 g/dL — ABNORMAL LOW (ref 12.0–16.0)
LYMPHS ABS: 1.3 10*3/uL (ref 1.0–3.6)
Lymphocytes Relative: 22 %
MCH: 25.2 pg — AB (ref 26.0–34.0)
MCHC: 32.1 g/dL (ref 32.0–36.0)
MCV: 78.5 fL — ABNORMAL LOW (ref 80.0–100.0)
MONO ABS: 0.6 10*3/uL (ref 0.2–0.9)
MONOS PCT: 9 %
Neutro Abs: 3.8 10*3/uL (ref 1.4–6.5)
Neutrophils Relative %: 63 %
Platelets: 228 10*3/uL (ref 150–440)
RBC: 4.26 MIL/uL (ref 3.80–5.20)
RDW: 16.8 % — ABNORMAL HIGH (ref 11.5–14.5)
WBC: 6.1 10*3/uL (ref 3.6–11.0)

## 2015-03-22 NOTE — Progress Notes (Signed)
Patient does not have living will.  Former smoker. 

## 2015-03-23 LAB — T4: T4, Total: 10.2 ug/dL (ref 4.5–12.0)

## 2015-03-23 LAB — THYROGLOBULIN ANTIBODY: Thyroglobulin Antibody: <1 IU/mL (ref 0.0–0.9)

## 2015-03-28 ENCOUNTER — Encounter: Payer: Self-pay | Admitting: Oncology

## 2015-03-28 DIAGNOSIS — C73 Malignant neoplasm of thyroid gland: Secondary | ICD-10-CM

## 2015-03-28 HISTORY — DX: Malignant neoplasm of thyroid gland: C73

## 2015-03-28 NOTE — Progress Notes (Signed)
[place] @ [facility] [phone]  Fax:(336) [place]: [birth year]  MR#: [id number]  JDB#:[id number]  Patient Care Team: Madelyn Brunner, MD as PCP - General (Internal Medicine)  CHIEF COMPLAINT:  Chief Complaint  Patient presents with  . Follow-up    Oncology History   81 year old female with right vocal cord paralysis and recurrent papillary thyroid cancer biopsy proven patient had diagnosis of thyroid cancer in 2004 had thyroidectomy followed by radioactive iodine treatment at  [facility].  initial diagnosis of papillary thyroid carcinoma  pT3 pN2 status post thyroidectomy in 2004 Recent endobronchial ultrasound-guided biopsies positive for papillary carcinoma 2.palliative radiation therapy started in May of 2014 3.patient has finished 7000 rads to the laryngeal area in June of 2014     Recurrent thyroid cancer  81 year old lady with recurrent thyroid cancer came today further followup he continues still hoarseness of voice.  Has finished radiation therapy.  No soreness in the mouth.  No difficulty in swallowing.  Appetite has been stable.  Here for ongoing evaluation and treatment consideration.i patient lives in  assisted living facilit  INTERVAL HISTORY:\ 81 year old lady with recurrent thyroid cancer came today further follow-up. Hoarseness of voice persists. No difficulty swallowing. No other significant new complaints. REVIEW OF SYSTEMS:   Gen. status: Feels weak and tired.  But not in any acute distress HEENT: Hoarseness of voice not changed no difficulty in swallowing Cardiac: No chest pain no shortness of breath no paroxysmal nocturnal dyspnea Lungs: No cough no hemoptysis no chest pain GI: Nausea no vomiting no diarrhea GU: No dysuria hematuria Musculoskeletal system no bony pain Skin: No rash Neurological system no headache no dizziness As per HPI. Otherwise, a complete review of systems is negatve.  PAST MEDICAL  HISTORY: Past Medical History  Diagnosis Date  . Cancer     Thyroid  . Recurrent thyroid cancer 03/28/2015    Significant History/PMH:   COPD:    hyponatermia:    CHF:    atrial fibrillation:    Depression:    Thyroid CA:    Hypocalcemia:    Appendectomy:    Thyroid removed:   Preventive Screening:  Has patient had any of the following test? Colonscopy  Mammography (1)   Last Colonoscopy: 2009(1)   Last Mammography: ? 2004(1)   Smoking History: Smoking History Quit 2010(1)  PFSH: Comments: Mother died of natural causes at age 3.  Father died cancer at age 57.  One brother died of cancer.  (Unspecified)  Social History: negative alcohol  Comments: Does not smoke now  Additional Past Medical and Surgical History: COPD  As mentioned above   ADVANCED DIRECTIVES:  Patient does not have living will HEALTH MAINTENANCE: History  Substance Use Topics  . Smoking status: Never Smoker   . Smokeless tobacco: Not on file  . Alcohol Use: Not on file      No Known Allergies  Current Outpatient Prescriptions  Medication Sig Dispense Refill  . acetaminophen (TYLENOL) 325 MG tablet Take by mouth.    Marland Kitchen alendronate (FOSAMAX) 70 MG tablet TAKE (1) ONCE A WEEK 30 MINUTES BEFORE BREAKFAST WITH FULL GLASS OF WA TER. DO NOT LIE DOWN AFTER.    . ARIPiprazole (ABILIFY) 2 MG tablet Take by mouth.    . Artificial Saliva (BIOTENE MOISTURIZING MOUTH) SOLN SWISH 5-10ML BY MOUTH AND THEN SPIT TWO TIMES A DAY 473 mL 1  . aspirin EC 81 MG tablet TAKE 1 TABLET BY MOUTH  ONCE DAILY.    Marland Kitchen atorvastatin (LIPITOR) 20 MG tablet TAKE 1 TABLET BY MOUTH ONCE DAILY FOR CHOLESTEROL    . calcitRIOL (ROCALTROL) 0.25 MCG capsule TAKE (1) CAPSULE BY MOUTH TWICE DAILY.    . calcium carbonate (CALCIUM 600) 600 MG TABS tablet TAKE (2) TABLETS BY MOUTH THREE TIMES DAILY.    . cetirizine (ZYRTEC) 10 MG tablet TAKE 1 TABLET BY MOUTH ONCE DAILY.    Marland Kitchen clopidogrel (PLAVIX) 75 MG tablet TAKE 1 TABLET BY MOUTH  ONCE DAILY.    . digoxin (DIGOX) 0.125 MG tablet TAKE 1 TABLET BY MOUTH ONCE DAILY.    Marland Kitchen docusate sodium (COLACE) 100 MG capsule TAKE (1) CAPSULE BY MOUTH TWICE DAILY.    . DULERA 100-5 MCG/ACT AERO     . enalapril (VASOTEC) 2.5 MG tablet TAKE (1) TABLET BY MOUTH DAILY FOR HIGH BLOOD PRESSURE.    Marland Kitchen escitalopram (LEXAPRO) 20 MG tablet Take by mouth.    . ferrous sulfate 325 (65 FE) MG tablet TAKE 1 TABLET BY MOUTH ONCE DAILY.    . fluticasone (FLONASE) 50 MCG/ACT nasal spray Place into the nose.    . furosemide (LASIX) 20 MG tablet TAKE 1 TABLET BY MOUTH ONCE DAILY.    Marland Kitchen ipratropium-albuterol (DUONEB) 0.5-2.5 (3) MG/3ML SOLN Inhale into the lungs.    Marland Kitchen levothyroxine (SYNTHROID, LEVOTHROID) 137 MCG tablet TAKE 1 TABLET BY MOUTH ONCE DAILY.    . metoprolol tartrate (LOPRESSOR) 25 MG tablet TAKE 1 TABLET BY MOUTH TWICE DAILY    . Multiple Vitamins-Minerals (I-VITE) TABS TAKE 1 TABLET BY MOUTH ONCE DAILY.    . pantoprazole (PROTONIX) 40 MG tablet TAKE 1 TABLET BY MOUTH ONCE DAILY.    Marland Kitchen potassium chloride SA (K-DUR,KLOR-CON) 20 MEQ tablet TAKE 1 TABLET BY MOUTH ONCE DAILY.    Marland Kitchen tiotropium (SPIRIVA HANDIHALER) 18 MCG inhalation capsule INHALE 1 CAPSULE AS DIRECTED ONCE DAILY.     No current facility-administered medications for this visit.    OBJECTIVE:  Filed Vitals:   03/22/15 1419  BP: 114/69  Pulse: 88  Temp: 94.5 F (34.7 C)     There is no height on file to calculate BMI.    ECOG FS:2 - Symptomatic, <50% confined to bed  PHYSICAL EXAM: Gen. status: Patient is alert and oriented not any acute distress HEENT: No palpable thyroid mass.  Mouth within normal limit.  No soreness.  No stomatitis Cardiac: Irregular heart sounds history of atrial fibrillation Lungs: At entry equal on both sides dictation's.  No rhonchi.  No rales Lymphatic system: Supraclavicular, cervical, axillary, inguinal lymph nodes are not palpable Abdominal exam revealed normal bowel sounds. The abdomen was soft,  non-tender, and without masses, organomegaly, or appreciable enlargement of the abdominal aorta. Neurologically, the patient was awake, alert, and oriented to person, place and time. There were no obvious focal neurologic abnormalities. Examination of the skin revealed no evidence of significant rashes, suspicious appearing nevi or other concerning lesions. Lower extremity no edema    LAB RESULTS:  Appointment on 03/22/2015  Component Date Value Ref Range Status  . WBC 03/22/2015 6.1  3.6 - 11.0 K/uL Final  . RBC 03/22/2015 4.26  3.80 - 5.20 MIL/uL Final  . Hemoglobin 03/22/2015 10.7* 12.0 - 16.0 g/dL Final  . HCT 03/22/2015 33.5* 35.0 - 47.0 % Final  . MCV 03/22/2015 78.5* 80.0 - 100.0 fL Final  . MCH 03/22/2015 25.2* 26.0 - 34.0 pg Final  . MCHC 03/22/2015 32.1  32.0 - 36.0 g/dL Final  .  RDW 03/22/2015 16.8* 11.5 - 14.5 % Final  . Platelets 03/22/2015 228  150 - 440 K/uL Final  . Neutrophils Relative % 03/22/2015 63   Final  . Neutro Abs 03/22/2015 3.8  1.4 - 6.5 K/uL Final  . Lymphocytes Relative 03/22/2015 22   Final  . Lymphs Abs 03/22/2015 1.3  1.0 - 3.6 K/uL Final  . Monocytes Relative 03/22/2015 9   Final  . Monocytes Absolute 03/22/2015 0.6  0.2 - 0.9 K/uL Final  . Eosinophils Relative 03/22/2015 5   Final  . Eosinophils Absolute 03/22/2015 0.3  0 - 0.7 K/uL Final  . Basophils Relative 03/22/2015 1   Final  . Basophils Absolute 03/22/2015 0.1  0 - 0.1 K/uL Final  . Sodium 03/22/2015 134* 135 - 145 mmol/L Final  . Potassium 03/22/2015 4.6  3.5 - 5.1 mmol/L Final  . Chloride 03/22/2015 96* 101 - 111 mmol/L Final  . CO2 03/22/2015 31  22 - 32 mmol/L Final  . Glucose, Bld 03/22/2015 117* 65 - 99 mg/dL Final  . BUN 03/22/2015 29* 6 - 20 mg/dL Final  . Creatinine, Ser 03/22/2015 1.13* 0.44 - 1.00 mg/dL Final  . Calcium 03/22/2015 7.7* 8.9 - 10.3 mg/dL Final  . Total Protein 03/22/2015 7.3  6.5 - 8.1 g/dL Final  . Albumin 03/22/2015 3.9  3.5 - 5.0 g/dL Final  . AST  03/22/2015 24  15 - 41 U/L Final  . ALT 03/22/2015 16  14 - 54 U/L Final  . Alkaline Phosphatase 03/22/2015 50  38 - 126 U/L Final  . Total Bilirubin 03/22/2015 0.5  0.3 - 1.2 mg/dL Final  . GFR calc non Af Amer 03/22/2015 44* >60 mL/min Final  . GFR calc Af Amer 03/22/2015 51* >60 mL/min Final   Comment: (NOTE) The eGFR has been calculated using the CKD EPI equation. This calculation has not been validated in all clinical situations. eGFR's persistently <60 mL/min signify possible Chronic Kidney Disease.   . Anion gap 03/22/2015 7  5 - 15 Final  . T4, Total 03/22/2015 10.2  4.5 - 12.0 ug/dL Final   Comment: (NOTE) Performed At: [facility] [place], [place] [id number] Lindon Romp MD UX:[id number]   . TSH 03/22/2015 1.064  0.350 - 4.500 uIU/mL Final  . Thyroglobulin Antibody 03/22/2015 <1.0  0.0 - 0.9 IU/mL Final   Comment: (NOTE) Thyroglobulin Antibody measured by [facility] Methodology Performed At: [facility] [place], [place] [id number] Lindon Romp MD TX:[id number]       ASSESSMENT: Recurrent carcinoma thyroid Anemia Hoarseness of voice  MEDICAL DECISION MAKING:  All lab data has been reviewed.  T4 TSH within acceptable range.  Continue same dose of thyroid. Patient expressed understanding and was in agreement with this plan. She also understands that She can call clinic at any time with any questions, concerns, or complaints.    No matching staging information was found for the patient.  Forest Gleason, MD   03/28/2015 9:33 PM

## 2015-04-10 DIAGNOSIS — C73 Malignant neoplasm of thyroid gland: Secondary | ICD-10-CM | POA: Insufficient documentation

## 2015-04-12 ENCOUNTER — Other Ambulatory Visit: Payer: Self-pay | Admitting: *Deleted

## 2015-04-12 DIAGNOSIS — C73 Malignant neoplasm of thyroid gland: Secondary | ICD-10-CM

## 2015-04-12 MED ORDER — BIOTENE DRY MOUTH MT LIQD: 15.0000 mL | Freq: Two times a day (BID) | OROMUCOSAL | Status: DC

## 2015-07-25 DIAGNOSIS — E034 Atrophy of thyroid (acquired): Secondary | ICD-10-CM | POA: Diagnosis not present

## 2015-07-25 DIAGNOSIS — J449 Chronic obstructive pulmonary disease, unspecified: Secondary | ICD-10-CM | POA: Diagnosis not present

## 2015-07-25 DIAGNOSIS — G4733 Obstructive sleep apnea (adult) (pediatric): Secondary | ICD-10-CM | POA: Diagnosis not present

## 2015-07-25 DIAGNOSIS — I482 Chronic atrial fibrillation: Secondary | ICD-10-CM | POA: Diagnosis not present

## 2015-07-26 DIAGNOSIS — J449 Chronic obstructive pulmonary disease, unspecified: Secondary | ICD-10-CM | POA: Diagnosis not present

## 2015-07-26 DIAGNOSIS — E034 Atrophy of thyroid (acquired): Secondary | ICD-10-CM | POA: Diagnosis not present

## 2015-07-26 DIAGNOSIS — G4733 Obstructive sleep apnea (adult) (pediatric): Secondary | ICD-10-CM | POA: Diagnosis not present

## 2015-07-26 DIAGNOSIS — I482 Chronic atrial fibrillation: Secondary | ICD-10-CM | POA: Diagnosis not present

## 2015-07-27 DIAGNOSIS — E034 Atrophy of thyroid (acquired): Secondary | ICD-10-CM | POA: Diagnosis not present

## 2015-07-27 DIAGNOSIS — I482 Chronic atrial fibrillation: Secondary | ICD-10-CM | POA: Diagnosis not present

## 2015-07-27 DIAGNOSIS — G4733 Obstructive sleep apnea (adult) (pediatric): Secondary | ICD-10-CM | POA: Diagnosis not present

## 2015-07-27 DIAGNOSIS — J449 Chronic obstructive pulmonary disease, unspecified: Secondary | ICD-10-CM | POA: Diagnosis not present

## 2015-07-28 DIAGNOSIS — G4733 Obstructive sleep apnea (adult) (pediatric): Secondary | ICD-10-CM | POA: Diagnosis not present

## 2015-07-28 DIAGNOSIS — I482 Chronic atrial fibrillation: Secondary | ICD-10-CM | POA: Diagnosis not present

## 2015-07-28 DIAGNOSIS — J449 Chronic obstructive pulmonary disease, unspecified: Secondary | ICD-10-CM | POA: Diagnosis not present

## 2015-07-28 DIAGNOSIS — E034 Atrophy of thyroid (acquired): Secondary | ICD-10-CM | POA: Diagnosis not present

## 2015-07-29 DIAGNOSIS — E034 Atrophy of thyroid (acquired): Secondary | ICD-10-CM | POA: Diagnosis not present

## 2015-07-29 DIAGNOSIS — I482 Chronic atrial fibrillation: Secondary | ICD-10-CM | POA: Diagnosis not present

## 2015-07-29 DIAGNOSIS — G4733 Obstructive sleep apnea (adult) (pediatric): Secondary | ICD-10-CM | POA: Diagnosis not present

## 2015-07-29 DIAGNOSIS — J449 Chronic obstructive pulmonary disease, unspecified: Secondary | ICD-10-CM | POA: Diagnosis not present

## 2015-07-30 DIAGNOSIS — E034 Atrophy of thyroid (acquired): Secondary | ICD-10-CM | POA: Diagnosis not present

## 2015-07-30 DIAGNOSIS — I482 Chronic atrial fibrillation: Secondary | ICD-10-CM | POA: Diagnosis not present

## 2015-07-30 DIAGNOSIS — G4733 Obstructive sleep apnea (adult) (pediatric): Secondary | ICD-10-CM | POA: Diagnosis not present

## 2015-07-30 DIAGNOSIS — J449 Chronic obstructive pulmonary disease, unspecified: Secondary | ICD-10-CM | POA: Diagnosis not present

## 2015-07-31 DIAGNOSIS — I482 Chronic atrial fibrillation: Secondary | ICD-10-CM | POA: Diagnosis not present

## 2015-07-31 DIAGNOSIS — J449 Chronic obstructive pulmonary disease, unspecified: Secondary | ICD-10-CM | POA: Diagnosis not present

## 2015-07-31 DIAGNOSIS — G4733 Obstructive sleep apnea (adult) (pediatric): Secondary | ICD-10-CM | POA: Diagnosis not present

## 2015-07-31 DIAGNOSIS — E034 Atrophy of thyroid (acquired): Secondary | ICD-10-CM | POA: Diagnosis not present

## 2015-08-01 DIAGNOSIS — E034 Atrophy of thyroid (acquired): Secondary | ICD-10-CM | POA: Diagnosis not present

## 2015-08-01 DIAGNOSIS — J449 Chronic obstructive pulmonary disease, unspecified: Secondary | ICD-10-CM | POA: Diagnosis not present

## 2015-08-01 DIAGNOSIS — G4733 Obstructive sleep apnea (adult) (pediatric): Secondary | ICD-10-CM | POA: Diagnosis not present

## 2015-08-01 DIAGNOSIS — I482 Chronic atrial fibrillation: Secondary | ICD-10-CM | POA: Diagnosis not present

## 2015-08-02 DIAGNOSIS — G4733 Obstructive sleep apnea (adult) (pediatric): Secondary | ICD-10-CM | POA: Diagnosis not present

## 2015-08-02 DIAGNOSIS — I482 Chronic atrial fibrillation: Secondary | ICD-10-CM | POA: Diagnosis not present

## 2015-08-02 DIAGNOSIS — J449 Chronic obstructive pulmonary disease, unspecified: Secondary | ICD-10-CM | POA: Diagnosis not present

## 2015-08-02 DIAGNOSIS — E034 Atrophy of thyroid (acquired): Secondary | ICD-10-CM | POA: Diagnosis not present

## 2015-08-03 DIAGNOSIS — E034 Atrophy of thyroid (acquired): Secondary | ICD-10-CM | POA: Diagnosis not present

## 2015-08-03 DIAGNOSIS — G4733 Obstructive sleep apnea (adult) (pediatric): Secondary | ICD-10-CM | POA: Diagnosis not present

## 2015-08-03 DIAGNOSIS — J449 Chronic obstructive pulmonary disease, unspecified: Secondary | ICD-10-CM | POA: Diagnosis not present

## 2015-08-03 DIAGNOSIS — I482 Chronic atrial fibrillation: Secondary | ICD-10-CM | POA: Diagnosis not present

## 2015-08-04 DIAGNOSIS — E034 Atrophy of thyroid (acquired): Secondary | ICD-10-CM | POA: Diagnosis not present

## 2015-08-04 DIAGNOSIS — J449 Chronic obstructive pulmonary disease, unspecified: Secondary | ICD-10-CM | POA: Diagnosis not present

## 2015-08-04 DIAGNOSIS — G4733 Obstructive sleep apnea (adult) (pediatric): Secondary | ICD-10-CM | POA: Diagnosis not present

## 2015-08-04 DIAGNOSIS — I482 Chronic atrial fibrillation: Secondary | ICD-10-CM | POA: Diagnosis not present

## 2015-08-05 DIAGNOSIS — J449 Chronic obstructive pulmonary disease, unspecified: Secondary | ICD-10-CM | POA: Diagnosis not present

## 2015-08-05 DIAGNOSIS — I482 Chronic atrial fibrillation: Secondary | ICD-10-CM | POA: Diagnosis not present

## 2015-08-05 DIAGNOSIS — G4733 Obstructive sleep apnea (adult) (pediatric): Secondary | ICD-10-CM | POA: Diagnosis not present

## 2015-08-05 DIAGNOSIS — E034 Atrophy of thyroid (acquired): Secondary | ICD-10-CM | POA: Diagnosis not present

## 2015-08-06 DIAGNOSIS — I482 Chronic atrial fibrillation: Secondary | ICD-10-CM | POA: Diagnosis not present

## 2015-08-06 DIAGNOSIS — G4733 Obstructive sleep apnea (adult) (pediatric): Secondary | ICD-10-CM | POA: Diagnosis not present

## 2015-08-06 DIAGNOSIS — E034 Atrophy of thyroid (acquired): Secondary | ICD-10-CM | POA: Diagnosis not present

## 2015-08-06 DIAGNOSIS — J449 Chronic obstructive pulmonary disease, unspecified: Secondary | ICD-10-CM | POA: Diagnosis not present

## 2015-08-07 DIAGNOSIS — I482 Chronic atrial fibrillation: Secondary | ICD-10-CM | POA: Diagnosis not present

## 2015-08-07 DIAGNOSIS — E034 Atrophy of thyroid (acquired): Secondary | ICD-10-CM | POA: Diagnosis not present

## 2015-08-07 DIAGNOSIS — G4733 Obstructive sleep apnea (adult) (pediatric): Secondary | ICD-10-CM | POA: Diagnosis not present

## 2015-08-07 DIAGNOSIS — J449 Chronic obstructive pulmonary disease, unspecified: Secondary | ICD-10-CM | POA: Diagnosis not present

## 2015-08-08 DIAGNOSIS — E034 Atrophy of thyroid (acquired): Secondary | ICD-10-CM | POA: Diagnosis not present

## 2015-08-08 DIAGNOSIS — G4733 Obstructive sleep apnea (adult) (pediatric): Secondary | ICD-10-CM | POA: Diagnosis not present

## 2015-08-08 DIAGNOSIS — J449 Chronic obstructive pulmonary disease, unspecified: Secondary | ICD-10-CM | POA: Diagnosis not present

## 2015-08-08 DIAGNOSIS — I482 Chronic atrial fibrillation: Secondary | ICD-10-CM | POA: Diagnosis not present

## 2015-08-09 DIAGNOSIS — G4733 Obstructive sleep apnea (adult) (pediatric): Secondary | ICD-10-CM | POA: Diagnosis not present

## 2015-08-09 DIAGNOSIS — E034 Atrophy of thyroid (acquired): Secondary | ICD-10-CM | POA: Diagnosis not present

## 2015-08-09 DIAGNOSIS — J449 Chronic obstructive pulmonary disease, unspecified: Secondary | ICD-10-CM | POA: Diagnosis not present

## 2015-08-09 DIAGNOSIS — I482 Chronic atrial fibrillation: Secondary | ICD-10-CM | POA: Diagnosis not present

## 2015-08-10 DIAGNOSIS — E034 Atrophy of thyroid (acquired): Secondary | ICD-10-CM | POA: Diagnosis not present

## 2015-08-10 DIAGNOSIS — J449 Chronic obstructive pulmonary disease, unspecified: Secondary | ICD-10-CM | POA: Diagnosis not present

## 2015-08-10 DIAGNOSIS — G4733 Obstructive sleep apnea (adult) (pediatric): Secondary | ICD-10-CM | POA: Diagnosis not present

## 2015-08-10 DIAGNOSIS — I482 Chronic atrial fibrillation: Secondary | ICD-10-CM | POA: Diagnosis not present

## 2015-08-11 DIAGNOSIS — I482 Chronic atrial fibrillation: Secondary | ICD-10-CM | POA: Diagnosis not present

## 2015-08-11 DIAGNOSIS — J449 Chronic obstructive pulmonary disease, unspecified: Secondary | ICD-10-CM | POA: Diagnosis not present

## 2015-08-11 DIAGNOSIS — G4733 Obstructive sleep apnea (adult) (pediatric): Secondary | ICD-10-CM | POA: Diagnosis not present

## 2015-08-11 DIAGNOSIS — E034 Atrophy of thyroid (acquired): Secondary | ICD-10-CM | POA: Diagnosis not present

## 2015-08-12 DIAGNOSIS — E034 Atrophy of thyroid (acquired): Secondary | ICD-10-CM | POA: Diagnosis not present

## 2015-08-12 DIAGNOSIS — I482 Chronic atrial fibrillation: Secondary | ICD-10-CM | POA: Diagnosis not present

## 2015-08-12 DIAGNOSIS — G4733 Obstructive sleep apnea (adult) (pediatric): Secondary | ICD-10-CM | POA: Diagnosis not present

## 2015-08-12 DIAGNOSIS — J449 Chronic obstructive pulmonary disease, unspecified: Secondary | ICD-10-CM | POA: Diagnosis not present

## 2015-08-13 DIAGNOSIS — I482 Chronic atrial fibrillation: Secondary | ICD-10-CM | POA: Diagnosis not present

## 2015-08-13 DIAGNOSIS — E034 Atrophy of thyroid (acquired): Secondary | ICD-10-CM | POA: Diagnosis not present

## 2015-08-13 DIAGNOSIS — J449 Chronic obstructive pulmonary disease, unspecified: Secondary | ICD-10-CM | POA: Diagnosis not present

## 2015-08-13 DIAGNOSIS — G4733 Obstructive sleep apnea (adult) (pediatric): Secondary | ICD-10-CM | POA: Diagnosis not present

## 2015-08-14 DIAGNOSIS — R0902 Hypoxemia: Secondary | ICD-10-CM | POA: Diagnosis not present

## 2015-08-14 DIAGNOSIS — J449 Chronic obstructive pulmonary disease, unspecified: Secondary | ICD-10-CM | POA: Diagnosis not present

## 2015-08-14 DIAGNOSIS — E034 Atrophy of thyroid (acquired): Secondary | ICD-10-CM | POA: Diagnosis not present

## 2015-08-14 DIAGNOSIS — I482 Chronic atrial fibrillation: Secondary | ICD-10-CM | POA: Diagnosis not present

## 2015-08-14 DIAGNOSIS — G4733 Obstructive sleep apnea (adult) (pediatric): Secondary | ICD-10-CM | POA: Diagnosis not present

## 2015-08-15 DIAGNOSIS — J449 Chronic obstructive pulmonary disease, unspecified: Secondary | ICD-10-CM | POA: Diagnosis not present

## 2015-08-15 DIAGNOSIS — G4733 Obstructive sleep apnea (adult) (pediatric): Secondary | ICD-10-CM | POA: Diagnosis not present

## 2015-08-15 DIAGNOSIS — I482 Chronic atrial fibrillation: Secondary | ICD-10-CM | POA: Diagnosis not present

## 2015-08-15 DIAGNOSIS — E034 Atrophy of thyroid (acquired): Secondary | ICD-10-CM | POA: Diagnosis not present

## 2015-08-16 DIAGNOSIS — E034 Atrophy of thyroid (acquired): Secondary | ICD-10-CM | POA: Diagnosis not present

## 2015-08-16 DIAGNOSIS — J449 Chronic obstructive pulmonary disease, unspecified: Secondary | ICD-10-CM | POA: Diagnosis not present

## 2015-08-16 DIAGNOSIS — I482 Chronic atrial fibrillation: Secondary | ICD-10-CM | POA: Diagnosis not present

## 2015-08-16 DIAGNOSIS — G4733 Obstructive sleep apnea (adult) (pediatric): Secondary | ICD-10-CM | POA: Diagnosis not present

## 2015-08-17 DIAGNOSIS — I482 Chronic atrial fibrillation: Secondary | ICD-10-CM | POA: Diagnosis not present

## 2015-08-17 DIAGNOSIS — J449 Chronic obstructive pulmonary disease, unspecified: Secondary | ICD-10-CM | POA: Diagnosis not present

## 2015-08-17 DIAGNOSIS — E034 Atrophy of thyroid (acquired): Secondary | ICD-10-CM | POA: Diagnosis not present

## 2015-08-17 DIAGNOSIS — G4733 Obstructive sleep apnea (adult) (pediatric): Secondary | ICD-10-CM | POA: Diagnosis not present

## 2015-08-18 DIAGNOSIS — E034 Atrophy of thyroid (acquired): Secondary | ICD-10-CM | POA: Diagnosis not present

## 2015-08-18 DIAGNOSIS — G4733 Obstructive sleep apnea (adult) (pediatric): Secondary | ICD-10-CM | POA: Diagnosis not present

## 2015-08-18 DIAGNOSIS — J449 Chronic obstructive pulmonary disease, unspecified: Secondary | ICD-10-CM | POA: Diagnosis not present

## 2015-08-18 DIAGNOSIS — I482 Chronic atrial fibrillation: Secondary | ICD-10-CM | POA: Diagnosis not present

## 2015-08-19 DIAGNOSIS — G4733 Obstructive sleep apnea (adult) (pediatric): Secondary | ICD-10-CM | POA: Diagnosis not present

## 2015-08-19 DIAGNOSIS — J449 Chronic obstructive pulmonary disease, unspecified: Secondary | ICD-10-CM | POA: Diagnosis not present

## 2015-08-19 DIAGNOSIS — I482 Chronic atrial fibrillation: Secondary | ICD-10-CM | POA: Diagnosis not present

## 2015-08-19 DIAGNOSIS — E034 Atrophy of thyroid (acquired): Secondary | ICD-10-CM | POA: Diagnosis not present

## 2015-08-20 DIAGNOSIS — J449 Chronic obstructive pulmonary disease, unspecified: Secondary | ICD-10-CM | POA: Diagnosis not present

## 2015-08-20 DIAGNOSIS — G4733 Obstructive sleep apnea (adult) (pediatric): Secondary | ICD-10-CM | POA: Diagnosis not present

## 2015-08-20 DIAGNOSIS — E034 Atrophy of thyroid (acquired): Secondary | ICD-10-CM | POA: Diagnosis not present

## 2015-08-20 DIAGNOSIS — I482 Chronic atrial fibrillation: Secondary | ICD-10-CM | POA: Diagnosis not present

## 2015-08-21 DIAGNOSIS — J449 Chronic obstructive pulmonary disease, unspecified: Secondary | ICD-10-CM | POA: Diagnosis not present

## 2015-08-21 DIAGNOSIS — G4733 Obstructive sleep apnea (adult) (pediatric): Secondary | ICD-10-CM | POA: Diagnosis not present

## 2015-08-21 DIAGNOSIS — I482 Chronic atrial fibrillation: Secondary | ICD-10-CM | POA: Diagnosis not present

## 2015-08-21 DIAGNOSIS — E034 Atrophy of thyroid (acquired): Secondary | ICD-10-CM | POA: Diagnosis not present

## 2015-08-22 DIAGNOSIS — J449 Chronic obstructive pulmonary disease, unspecified: Secondary | ICD-10-CM | POA: Diagnosis not present

## 2015-08-22 DIAGNOSIS — E034 Atrophy of thyroid (acquired): Secondary | ICD-10-CM | POA: Diagnosis not present

## 2015-08-22 DIAGNOSIS — G4733 Obstructive sleep apnea (adult) (pediatric): Secondary | ICD-10-CM | POA: Diagnosis not present

## 2015-08-22 DIAGNOSIS — I482 Chronic atrial fibrillation: Secondary | ICD-10-CM | POA: Diagnosis not present

## 2015-08-23 DIAGNOSIS — E034 Atrophy of thyroid (acquired): Secondary | ICD-10-CM | POA: Diagnosis not present

## 2015-08-23 DIAGNOSIS — G4733 Obstructive sleep apnea (adult) (pediatric): Secondary | ICD-10-CM | POA: Diagnosis not present

## 2015-08-23 DIAGNOSIS — I482 Chronic atrial fibrillation: Secondary | ICD-10-CM | POA: Diagnosis not present

## 2015-08-23 DIAGNOSIS — J449 Chronic obstructive pulmonary disease, unspecified: Secondary | ICD-10-CM | POA: Diagnosis not present

## 2015-08-24 DIAGNOSIS — E034 Atrophy of thyroid (acquired): Secondary | ICD-10-CM | POA: Diagnosis not present

## 2015-08-24 DIAGNOSIS — G4733 Obstructive sleep apnea (adult) (pediatric): Secondary | ICD-10-CM | POA: Diagnosis not present

## 2015-08-24 DIAGNOSIS — J449 Chronic obstructive pulmonary disease, unspecified: Secondary | ICD-10-CM | POA: Diagnosis not present

## 2015-08-24 DIAGNOSIS — I482 Chronic atrial fibrillation: Secondary | ICD-10-CM | POA: Diagnosis not present

## 2015-08-25 DIAGNOSIS — E034 Atrophy of thyroid (acquired): Secondary | ICD-10-CM | POA: Diagnosis not present

## 2015-08-25 DIAGNOSIS — G4733 Obstructive sleep apnea (adult) (pediatric): Secondary | ICD-10-CM | POA: Diagnosis not present

## 2015-08-25 DIAGNOSIS — J449 Chronic obstructive pulmonary disease, unspecified: Secondary | ICD-10-CM | POA: Diagnosis not present

## 2015-08-25 DIAGNOSIS — I482 Chronic atrial fibrillation: Secondary | ICD-10-CM | POA: Diagnosis not present

## 2015-08-26 DIAGNOSIS — J449 Chronic obstructive pulmonary disease, unspecified: Secondary | ICD-10-CM | POA: Diagnosis not present

## 2015-08-26 DIAGNOSIS — I482 Chronic atrial fibrillation: Secondary | ICD-10-CM | POA: Diagnosis not present

## 2015-08-26 DIAGNOSIS — E034 Atrophy of thyroid (acquired): Secondary | ICD-10-CM | POA: Diagnosis not present

## 2015-08-26 DIAGNOSIS — G4733 Obstructive sleep apnea (adult) (pediatric): Secondary | ICD-10-CM | POA: Diagnosis not present

## 2015-08-27 DIAGNOSIS — G4733 Obstructive sleep apnea (adult) (pediatric): Secondary | ICD-10-CM | POA: Diagnosis not present

## 2015-08-27 DIAGNOSIS — E034 Atrophy of thyroid (acquired): Secondary | ICD-10-CM | POA: Diagnosis not present

## 2015-08-27 DIAGNOSIS — J449 Chronic obstructive pulmonary disease, unspecified: Secondary | ICD-10-CM | POA: Diagnosis not present

## 2015-08-27 DIAGNOSIS — I482 Chronic atrial fibrillation: Secondary | ICD-10-CM | POA: Diagnosis not present

## 2015-08-28 DIAGNOSIS — J449 Chronic obstructive pulmonary disease, unspecified: Secondary | ICD-10-CM | POA: Diagnosis not present

## 2015-08-28 DIAGNOSIS — G4733 Obstructive sleep apnea (adult) (pediatric): Secondary | ICD-10-CM | POA: Diagnosis not present

## 2015-08-28 DIAGNOSIS — I482 Chronic atrial fibrillation: Secondary | ICD-10-CM | POA: Diagnosis not present

## 2015-08-28 DIAGNOSIS — E034 Atrophy of thyroid (acquired): Secondary | ICD-10-CM | POA: Diagnosis not present

## 2015-08-29 DIAGNOSIS — J449 Chronic obstructive pulmonary disease, unspecified: Secondary | ICD-10-CM | POA: Diagnosis not present

## 2015-08-29 DIAGNOSIS — G4733 Obstructive sleep apnea (adult) (pediatric): Secondary | ICD-10-CM | POA: Diagnosis not present

## 2015-08-29 DIAGNOSIS — E034 Atrophy of thyroid (acquired): Secondary | ICD-10-CM | POA: Diagnosis not present

## 2015-08-29 DIAGNOSIS — I482 Chronic atrial fibrillation: Secondary | ICD-10-CM | POA: Diagnosis not present

## 2015-08-30 DIAGNOSIS — G4733 Obstructive sleep apnea (adult) (pediatric): Secondary | ICD-10-CM | POA: Diagnosis not present

## 2015-08-30 DIAGNOSIS — I482 Chronic atrial fibrillation: Secondary | ICD-10-CM | POA: Diagnosis not present

## 2015-08-30 DIAGNOSIS — E034 Atrophy of thyroid (acquired): Secondary | ICD-10-CM | POA: Diagnosis not present

## 2015-08-30 DIAGNOSIS — J449 Chronic obstructive pulmonary disease, unspecified: Secondary | ICD-10-CM | POA: Diagnosis not present

## 2015-08-31 DIAGNOSIS — E034 Atrophy of thyroid (acquired): Secondary | ICD-10-CM | POA: Diagnosis not present

## 2015-08-31 DIAGNOSIS — I482 Chronic atrial fibrillation: Secondary | ICD-10-CM | POA: Diagnosis not present

## 2015-08-31 DIAGNOSIS — J449 Chronic obstructive pulmonary disease, unspecified: Secondary | ICD-10-CM | POA: Diagnosis not present

## 2015-08-31 DIAGNOSIS — G4733 Obstructive sleep apnea (adult) (pediatric): Secondary | ICD-10-CM | POA: Diagnosis not present

## 2015-09-01 DIAGNOSIS — I482 Chronic atrial fibrillation: Secondary | ICD-10-CM | POA: Diagnosis not present

## 2015-09-01 DIAGNOSIS — E034 Atrophy of thyroid (acquired): Secondary | ICD-10-CM | POA: Diagnosis not present

## 2015-09-01 DIAGNOSIS — J449 Chronic obstructive pulmonary disease, unspecified: Secondary | ICD-10-CM | POA: Diagnosis not present

## 2015-09-01 DIAGNOSIS — G4733 Obstructive sleep apnea (adult) (pediatric): Secondary | ICD-10-CM | POA: Diagnosis not present

## 2015-09-02 DIAGNOSIS — E034 Atrophy of thyroid (acquired): Secondary | ICD-10-CM | POA: Diagnosis not present

## 2015-09-02 DIAGNOSIS — J449 Chronic obstructive pulmonary disease, unspecified: Secondary | ICD-10-CM | POA: Diagnosis not present

## 2015-09-02 DIAGNOSIS — G4733 Obstructive sleep apnea (adult) (pediatric): Secondary | ICD-10-CM | POA: Diagnosis not present

## 2015-09-02 DIAGNOSIS — I482 Chronic atrial fibrillation: Secondary | ICD-10-CM | POA: Diagnosis not present

## 2015-09-03 DIAGNOSIS — I482 Chronic atrial fibrillation: Secondary | ICD-10-CM | POA: Diagnosis not present

## 2015-09-03 DIAGNOSIS — J449 Chronic obstructive pulmonary disease, unspecified: Secondary | ICD-10-CM | POA: Diagnosis not present

## 2015-09-03 DIAGNOSIS — E034 Atrophy of thyroid (acquired): Secondary | ICD-10-CM | POA: Diagnosis not present

## 2015-09-03 DIAGNOSIS — G4733 Obstructive sleep apnea (adult) (pediatric): Secondary | ICD-10-CM | POA: Diagnosis not present

## 2015-09-04 DIAGNOSIS — G4733 Obstructive sleep apnea (adult) (pediatric): Secondary | ICD-10-CM | POA: Diagnosis not present

## 2015-09-04 DIAGNOSIS — E034 Atrophy of thyroid (acquired): Secondary | ICD-10-CM | POA: Diagnosis not present

## 2015-09-04 DIAGNOSIS — J449 Chronic obstructive pulmonary disease, unspecified: Secondary | ICD-10-CM | POA: Diagnosis not present

## 2015-09-04 DIAGNOSIS — I482 Chronic atrial fibrillation: Secondary | ICD-10-CM | POA: Diagnosis not present

## 2015-09-05 DIAGNOSIS — E034 Atrophy of thyroid (acquired): Secondary | ICD-10-CM | POA: Diagnosis not present

## 2015-09-05 DIAGNOSIS — I482 Chronic atrial fibrillation: Secondary | ICD-10-CM | POA: Diagnosis not present

## 2015-09-05 DIAGNOSIS — G4733 Obstructive sleep apnea (adult) (pediatric): Secondary | ICD-10-CM | POA: Diagnosis not present

## 2015-09-05 DIAGNOSIS — J449 Chronic obstructive pulmonary disease, unspecified: Secondary | ICD-10-CM | POA: Diagnosis not present

## 2015-09-06 DIAGNOSIS — G4733 Obstructive sleep apnea (adult) (pediatric): Secondary | ICD-10-CM | POA: Diagnosis not present

## 2015-09-06 DIAGNOSIS — C73 Malignant neoplasm of thyroid gland: Secondary | ICD-10-CM | POA: Diagnosis not present

## 2015-09-06 DIAGNOSIS — I482 Chronic atrial fibrillation: Secondary | ICD-10-CM | POA: Diagnosis not present

## 2015-09-06 DIAGNOSIS — E034 Atrophy of thyroid (acquired): Secondary | ICD-10-CM | POA: Diagnosis not present

## 2015-09-06 DIAGNOSIS — J449 Chronic obstructive pulmonary disease, unspecified: Secondary | ICD-10-CM | POA: Diagnosis not present

## 2015-09-07 DIAGNOSIS — G4733 Obstructive sleep apnea (adult) (pediatric): Secondary | ICD-10-CM | POA: Diagnosis not present

## 2015-09-07 DIAGNOSIS — I482 Chronic atrial fibrillation: Secondary | ICD-10-CM | POA: Diagnosis not present

## 2015-09-07 DIAGNOSIS — E034 Atrophy of thyroid (acquired): Secondary | ICD-10-CM | POA: Diagnosis not present

## 2015-09-07 DIAGNOSIS — J449 Chronic obstructive pulmonary disease, unspecified: Secondary | ICD-10-CM | POA: Diagnosis not present

## 2015-09-08 ENCOUNTER — Other Ambulatory Visit: Payer: Self-pay | Admitting: Oncology

## 2015-09-08 DIAGNOSIS — E034 Atrophy of thyroid (acquired): Secondary | ICD-10-CM | POA: Diagnosis not present

## 2015-09-08 DIAGNOSIS — J449 Chronic obstructive pulmonary disease, unspecified: Secondary | ICD-10-CM | POA: Diagnosis not present

## 2015-09-08 DIAGNOSIS — I482 Chronic atrial fibrillation: Secondary | ICD-10-CM | POA: Diagnosis not present

## 2015-09-08 DIAGNOSIS — G4733 Obstructive sleep apnea (adult) (pediatric): Secondary | ICD-10-CM | POA: Diagnosis not present

## 2015-09-09 DIAGNOSIS — E034 Atrophy of thyroid (acquired): Secondary | ICD-10-CM | POA: Diagnosis not present

## 2015-09-09 DIAGNOSIS — I482 Chronic atrial fibrillation: Secondary | ICD-10-CM | POA: Diagnosis not present

## 2015-09-09 DIAGNOSIS — G4733 Obstructive sleep apnea (adult) (pediatric): Secondary | ICD-10-CM | POA: Diagnosis not present

## 2015-09-09 DIAGNOSIS — J449 Chronic obstructive pulmonary disease, unspecified: Secondary | ICD-10-CM | POA: Diagnosis not present

## 2015-09-10 DIAGNOSIS — E034 Atrophy of thyroid (acquired): Secondary | ICD-10-CM | POA: Diagnosis not present

## 2015-09-10 DIAGNOSIS — J449 Chronic obstructive pulmonary disease, unspecified: Secondary | ICD-10-CM | POA: Diagnosis not present

## 2015-09-10 DIAGNOSIS — I482 Chronic atrial fibrillation: Secondary | ICD-10-CM | POA: Diagnosis not present

## 2015-09-10 DIAGNOSIS — G4733 Obstructive sleep apnea (adult) (pediatric): Secondary | ICD-10-CM | POA: Diagnosis not present

## 2015-09-11 DIAGNOSIS — I482 Chronic atrial fibrillation: Secondary | ICD-10-CM | POA: Diagnosis not present

## 2015-09-11 DIAGNOSIS — J449 Chronic obstructive pulmonary disease, unspecified: Secondary | ICD-10-CM | POA: Diagnosis not present

## 2015-09-11 DIAGNOSIS — G4733 Obstructive sleep apnea (adult) (pediatric): Secondary | ICD-10-CM | POA: Diagnosis not present

## 2015-09-11 DIAGNOSIS — E034 Atrophy of thyroid (acquired): Secondary | ICD-10-CM | POA: Diagnosis not present

## 2015-09-12 DIAGNOSIS — G4733 Obstructive sleep apnea (adult) (pediatric): Secondary | ICD-10-CM | POA: Diagnosis not present

## 2015-09-12 DIAGNOSIS — I482 Chronic atrial fibrillation: Secondary | ICD-10-CM | POA: Diagnosis not present

## 2015-09-12 DIAGNOSIS — J449 Chronic obstructive pulmonary disease, unspecified: Secondary | ICD-10-CM | POA: Diagnosis not present

## 2015-09-12 DIAGNOSIS — E034 Atrophy of thyroid (acquired): Secondary | ICD-10-CM | POA: Diagnosis not present

## 2015-09-13 DIAGNOSIS — G4733 Obstructive sleep apnea (adult) (pediatric): Secondary | ICD-10-CM | POA: Diagnosis not present

## 2015-09-13 DIAGNOSIS — E034 Atrophy of thyroid (acquired): Secondary | ICD-10-CM | POA: Diagnosis not present

## 2015-09-13 DIAGNOSIS — R0902 Hypoxemia: Secondary | ICD-10-CM | POA: Diagnosis not present

## 2015-09-13 DIAGNOSIS — J449 Chronic obstructive pulmonary disease, unspecified: Secondary | ICD-10-CM | POA: Diagnosis not present

## 2015-09-13 DIAGNOSIS — I482 Chronic atrial fibrillation: Secondary | ICD-10-CM | POA: Diagnosis not present

## 2015-09-14 DIAGNOSIS — E034 Atrophy of thyroid (acquired): Secondary | ICD-10-CM | POA: Diagnosis not present

## 2015-09-14 DIAGNOSIS — J449 Chronic obstructive pulmonary disease, unspecified: Secondary | ICD-10-CM | POA: Diagnosis not present

## 2015-09-14 DIAGNOSIS — G4733 Obstructive sleep apnea (adult) (pediatric): Secondary | ICD-10-CM | POA: Diagnosis not present

## 2015-09-14 DIAGNOSIS — I482 Chronic atrial fibrillation: Secondary | ICD-10-CM | POA: Diagnosis not present

## 2015-09-15 DIAGNOSIS — J449 Chronic obstructive pulmonary disease, unspecified: Secondary | ICD-10-CM | POA: Diagnosis not present

## 2015-09-15 DIAGNOSIS — G4733 Obstructive sleep apnea (adult) (pediatric): Secondary | ICD-10-CM | POA: Diagnosis not present

## 2015-09-15 DIAGNOSIS — I482 Chronic atrial fibrillation: Secondary | ICD-10-CM | POA: Diagnosis not present

## 2015-09-15 DIAGNOSIS — E034 Atrophy of thyroid (acquired): Secondary | ICD-10-CM | POA: Diagnosis not present

## 2015-09-16 DIAGNOSIS — E034 Atrophy of thyroid (acquired): Secondary | ICD-10-CM | POA: Diagnosis not present

## 2015-09-16 DIAGNOSIS — I482 Chronic atrial fibrillation: Secondary | ICD-10-CM | POA: Diagnosis not present

## 2015-09-16 DIAGNOSIS — G4733 Obstructive sleep apnea (adult) (pediatric): Secondary | ICD-10-CM | POA: Diagnosis not present

## 2015-09-16 DIAGNOSIS — J449 Chronic obstructive pulmonary disease, unspecified: Secondary | ICD-10-CM | POA: Diagnosis not present

## 2015-09-17 DIAGNOSIS — G4733 Obstructive sleep apnea (adult) (pediatric): Secondary | ICD-10-CM | POA: Diagnosis not present

## 2015-09-17 DIAGNOSIS — J449 Chronic obstructive pulmonary disease, unspecified: Secondary | ICD-10-CM | POA: Diagnosis not present

## 2015-09-17 DIAGNOSIS — E034 Atrophy of thyroid (acquired): Secondary | ICD-10-CM | POA: Diagnosis not present

## 2015-09-17 DIAGNOSIS — I482 Chronic atrial fibrillation: Secondary | ICD-10-CM | POA: Diagnosis not present

## 2015-09-18 DIAGNOSIS — J449 Chronic obstructive pulmonary disease, unspecified: Secondary | ICD-10-CM | POA: Diagnosis not present

## 2015-09-18 DIAGNOSIS — I482 Chronic atrial fibrillation: Secondary | ICD-10-CM | POA: Diagnosis not present

## 2015-09-18 DIAGNOSIS — G4733 Obstructive sleep apnea (adult) (pediatric): Secondary | ICD-10-CM | POA: Diagnosis not present

## 2015-09-18 DIAGNOSIS — E034 Atrophy of thyroid (acquired): Secondary | ICD-10-CM | POA: Diagnosis not present

## 2015-09-19 DIAGNOSIS — G4733 Obstructive sleep apnea (adult) (pediatric): Secondary | ICD-10-CM | POA: Diagnosis not present

## 2015-09-19 DIAGNOSIS — I482 Chronic atrial fibrillation: Secondary | ICD-10-CM | POA: Diagnosis not present

## 2015-09-19 DIAGNOSIS — J449 Chronic obstructive pulmonary disease, unspecified: Secondary | ICD-10-CM | POA: Diagnosis not present

## 2015-09-19 DIAGNOSIS — E034 Atrophy of thyroid (acquired): Secondary | ICD-10-CM | POA: Diagnosis not present

## 2015-09-20 DIAGNOSIS — E034 Atrophy of thyroid (acquired): Secondary | ICD-10-CM | POA: Diagnosis not present

## 2015-09-20 DIAGNOSIS — G4733 Obstructive sleep apnea (adult) (pediatric): Secondary | ICD-10-CM | POA: Diagnosis not present

## 2015-09-20 DIAGNOSIS — I482 Chronic atrial fibrillation: Secondary | ICD-10-CM | POA: Diagnosis not present

## 2015-09-20 DIAGNOSIS — J449 Chronic obstructive pulmonary disease, unspecified: Secondary | ICD-10-CM | POA: Diagnosis not present

## 2015-09-21 ENCOUNTER — Other Ambulatory Visit: Payer: Medicare PPO

## 2015-09-21 ENCOUNTER — Ambulatory Visit: Payer: Medicare PPO | Admitting: Oncology

## 2015-09-21 DIAGNOSIS — G4733 Obstructive sleep apnea (adult) (pediatric): Secondary | ICD-10-CM | POA: Diagnosis not present

## 2015-09-21 DIAGNOSIS — E034 Atrophy of thyroid (acquired): Secondary | ICD-10-CM | POA: Diagnosis not present

## 2015-09-21 DIAGNOSIS — I482 Chronic atrial fibrillation: Secondary | ICD-10-CM | POA: Diagnosis not present

## 2015-09-21 DIAGNOSIS — J449 Chronic obstructive pulmonary disease, unspecified: Secondary | ICD-10-CM | POA: Diagnosis not present

## 2015-09-22 DIAGNOSIS — J449 Chronic obstructive pulmonary disease, unspecified: Secondary | ICD-10-CM | POA: Diagnosis not present

## 2015-09-22 DIAGNOSIS — G4733 Obstructive sleep apnea (adult) (pediatric): Secondary | ICD-10-CM | POA: Diagnosis not present

## 2015-09-22 DIAGNOSIS — E034 Atrophy of thyroid (acquired): Secondary | ICD-10-CM | POA: Diagnosis not present

## 2015-09-22 DIAGNOSIS — I482 Chronic atrial fibrillation: Secondary | ICD-10-CM | POA: Diagnosis not present

## 2015-09-23 DIAGNOSIS — I482 Chronic atrial fibrillation: Secondary | ICD-10-CM | POA: Diagnosis not present

## 2015-09-23 DIAGNOSIS — J449 Chronic obstructive pulmonary disease, unspecified: Secondary | ICD-10-CM | POA: Diagnosis not present

## 2015-09-23 DIAGNOSIS — E034 Atrophy of thyroid (acquired): Secondary | ICD-10-CM | POA: Diagnosis not present

## 2015-09-23 DIAGNOSIS — G4733 Obstructive sleep apnea (adult) (pediatric): Secondary | ICD-10-CM | POA: Diagnosis not present

## 2015-09-24 DIAGNOSIS — I482 Chronic atrial fibrillation: Secondary | ICD-10-CM | POA: Diagnosis not present

## 2015-09-24 DIAGNOSIS — E034 Atrophy of thyroid (acquired): Secondary | ICD-10-CM | POA: Diagnosis not present

## 2015-09-24 DIAGNOSIS — G4733 Obstructive sleep apnea (adult) (pediatric): Secondary | ICD-10-CM | POA: Diagnosis not present

## 2015-09-24 DIAGNOSIS — J449 Chronic obstructive pulmonary disease, unspecified: Secondary | ICD-10-CM | POA: Diagnosis not present

## 2015-09-25 DIAGNOSIS — E034 Atrophy of thyroid (acquired): Secondary | ICD-10-CM | POA: Diagnosis not present

## 2015-09-25 DIAGNOSIS — I482 Chronic atrial fibrillation: Secondary | ICD-10-CM | POA: Diagnosis not present

## 2015-09-25 DIAGNOSIS — J449 Chronic obstructive pulmonary disease, unspecified: Secondary | ICD-10-CM | POA: Diagnosis not present

## 2015-09-25 DIAGNOSIS — G4733 Obstructive sleep apnea (adult) (pediatric): Secondary | ICD-10-CM | POA: Diagnosis not present

## 2015-09-26 DIAGNOSIS — G4733 Obstructive sleep apnea (adult) (pediatric): Secondary | ICD-10-CM | POA: Diagnosis not present

## 2015-09-26 DIAGNOSIS — I482 Chronic atrial fibrillation: Secondary | ICD-10-CM | POA: Diagnosis not present

## 2015-09-26 DIAGNOSIS — J449 Chronic obstructive pulmonary disease, unspecified: Secondary | ICD-10-CM | POA: Diagnosis not present

## 2015-09-26 DIAGNOSIS — E034 Atrophy of thyroid (acquired): Secondary | ICD-10-CM | POA: Diagnosis not present

## 2015-09-27 DIAGNOSIS — H353122 Nonexudative age-related macular degeneration, left eye, intermediate dry stage: Secondary | ICD-10-CM | POA: Diagnosis not present

## 2015-09-27 DIAGNOSIS — I482 Chronic atrial fibrillation: Secondary | ICD-10-CM | POA: Diagnosis not present

## 2015-09-27 DIAGNOSIS — J449 Chronic obstructive pulmonary disease, unspecified: Secondary | ICD-10-CM | POA: Diagnosis not present

## 2015-09-27 DIAGNOSIS — H26491 Other secondary cataract, right eye: Secondary | ICD-10-CM | POA: Diagnosis not present

## 2015-09-27 DIAGNOSIS — H52223 Regular astigmatism, bilateral: Secondary | ICD-10-CM | POA: Diagnosis not present

## 2015-09-27 DIAGNOSIS — H353112 Nonexudative age-related macular degeneration, right eye, intermediate dry stage: Secondary | ICD-10-CM | POA: Diagnosis not present

## 2015-09-27 DIAGNOSIS — H5203 Hypermetropia, bilateral: Secondary | ICD-10-CM | POA: Diagnosis not present

## 2015-09-27 DIAGNOSIS — E034 Atrophy of thyroid (acquired): Secondary | ICD-10-CM | POA: Diagnosis not present

## 2015-09-27 DIAGNOSIS — H524 Presbyopia: Secondary | ICD-10-CM | POA: Diagnosis not present

## 2015-09-27 DIAGNOSIS — H35342 Macular cyst, hole, or pseudohole, left eye: Secondary | ICD-10-CM | POA: Diagnosis not present

## 2015-09-27 DIAGNOSIS — H26492 Other secondary cataract, left eye: Secondary | ICD-10-CM | POA: Diagnosis not present

## 2015-09-27 DIAGNOSIS — H35341 Macular cyst, hole, or pseudohole, right eye: Secondary | ICD-10-CM | POA: Diagnosis not present

## 2015-09-27 DIAGNOSIS — G4733 Obstructive sleep apnea (adult) (pediatric): Secondary | ICD-10-CM | POA: Diagnosis not present

## 2015-09-28 DIAGNOSIS — G4733 Obstructive sleep apnea (adult) (pediatric): Secondary | ICD-10-CM | POA: Diagnosis not present

## 2015-09-28 DIAGNOSIS — J449 Chronic obstructive pulmonary disease, unspecified: Secondary | ICD-10-CM | POA: Diagnosis not present

## 2015-09-28 DIAGNOSIS — E034 Atrophy of thyroid (acquired): Secondary | ICD-10-CM | POA: Diagnosis not present

## 2015-09-28 DIAGNOSIS — I482 Chronic atrial fibrillation: Secondary | ICD-10-CM | POA: Diagnosis not present

## 2015-09-29 DIAGNOSIS — I482 Chronic atrial fibrillation: Secondary | ICD-10-CM | POA: Diagnosis not present

## 2015-09-29 DIAGNOSIS — J449 Chronic obstructive pulmonary disease, unspecified: Secondary | ICD-10-CM | POA: Diagnosis not present

## 2015-09-29 DIAGNOSIS — G4733 Obstructive sleep apnea (adult) (pediatric): Secondary | ICD-10-CM | POA: Diagnosis not present

## 2015-09-29 DIAGNOSIS — E034 Atrophy of thyroid (acquired): Secondary | ICD-10-CM | POA: Diagnosis not present

## 2015-09-30 DIAGNOSIS — I482 Chronic atrial fibrillation: Secondary | ICD-10-CM | POA: Diagnosis not present

## 2015-09-30 DIAGNOSIS — E034 Atrophy of thyroid (acquired): Secondary | ICD-10-CM | POA: Diagnosis not present

## 2015-09-30 DIAGNOSIS — G4733 Obstructive sleep apnea (adult) (pediatric): Secondary | ICD-10-CM | POA: Diagnosis not present

## 2015-09-30 DIAGNOSIS — J449 Chronic obstructive pulmonary disease, unspecified: Secondary | ICD-10-CM | POA: Diagnosis not present

## 2015-10-01 DIAGNOSIS — E034 Atrophy of thyroid (acquired): Secondary | ICD-10-CM | POA: Diagnosis not present

## 2015-10-01 DIAGNOSIS — G4733 Obstructive sleep apnea (adult) (pediatric): Secondary | ICD-10-CM | POA: Diagnosis not present

## 2015-10-01 DIAGNOSIS — J449 Chronic obstructive pulmonary disease, unspecified: Secondary | ICD-10-CM | POA: Diagnosis not present

## 2015-10-01 DIAGNOSIS — I482 Chronic atrial fibrillation: Secondary | ICD-10-CM | POA: Diagnosis not present

## 2015-10-02 DIAGNOSIS — G4733 Obstructive sleep apnea (adult) (pediatric): Secondary | ICD-10-CM | POA: Diagnosis not present

## 2015-10-02 DIAGNOSIS — I482 Chronic atrial fibrillation: Secondary | ICD-10-CM | POA: Diagnosis not present

## 2015-10-02 DIAGNOSIS — J449 Chronic obstructive pulmonary disease, unspecified: Secondary | ICD-10-CM | POA: Diagnosis not present

## 2015-10-02 DIAGNOSIS — E034 Atrophy of thyroid (acquired): Secondary | ICD-10-CM | POA: Diagnosis not present

## 2015-10-03 DIAGNOSIS — G4733 Obstructive sleep apnea (adult) (pediatric): Secondary | ICD-10-CM | POA: Diagnosis not present

## 2015-10-03 DIAGNOSIS — J449 Chronic obstructive pulmonary disease, unspecified: Secondary | ICD-10-CM | POA: Diagnosis not present

## 2015-10-03 DIAGNOSIS — I482 Chronic atrial fibrillation: Secondary | ICD-10-CM | POA: Diagnosis not present

## 2015-10-03 DIAGNOSIS — E034 Atrophy of thyroid (acquired): Secondary | ICD-10-CM | POA: Diagnosis not present

## 2015-10-04 DIAGNOSIS — I482 Chronic atrial fibrillation: Secondary | ICD-10-CM | POA: Diagnosis not present

## 2015-10-04 DIAGNOSIS — E034 Atrophy of thyroid (acquired): Secondary | ICD-10-CM | POA: Diagnosis not present

## 2015-10-04 DIAGNOSIS — G4733 Obstructive sleep apnea (adult) (pediatric): Secondary | ICD-10-CM | POA: Diagnosis not present

## 2015-10-04 DIAGNOSIS — J449 Chronic obstructive pulmonary disease, unspecified: Secondary | ICD-10-CM | POA: Diagnosis not present

## 2015-10-05 DIAGNOSIS — J449 Chronic obstructive pulmonary disease, unspecified: Secondary | ICD-10-CM | POA: Diagnosis not present

## 2015-10-05 DIAGNOSIS — E034 Atrophy of thyroid (acquired): Secondary | ICD-10-CM | POA: Diagnosis not present

## 2015-10-05 DIAGNOSIS — G4733 Obstructive sleep apnea (adult) (pediatric): Secondary | ICD-10-CM | POA: Diagnosis not present

## 2015-10-05 DIAGNOSIS — I482 Chronic atrial fibrillation: Secondary | ICD-10-CM | POA: Diagnosis not present

## 2015-10-06 DIAGNOSIS — G4733 Obstructive sleep apnea (adult) (pediatric): Secondary | ICD-10-CM | POA: Diagnosis not present

## 2015-10-06 DIAGNOSIS — E034 Atrophy of thyroid (acquired): Secondary | ICD-10-CM | POA: Diagnosis not present

## 2015-10-06 DIAGNOSIS — I482 Chronic atrial fibrillation: Secondary | ICD-10-CM | POA: Diagnosis not present

## 2015-10-06 DIAGNOSIS — J449 Chronic obstructive pulmonary disease, unspecified: Secondary | ICD-10-CM | POA: Diagnosis not present

## 2015-10-07 DIAGNOSIS — E034 Atrophy of thyroid (acquired): Secondary | ICD-10-CM | POA: Diagnosis not present

## 2015-10-07 DIAGNOSIS — I482 Chronic atrial fibrillation: Secondary | ICD-10-CM | POA: Diagnosis not present

## 2015-10-07 DIAGNOSIS — J449 Chronic obstructive pulmonary disease, unspecified: Secondary | ICD-10-CM | POA: Diagnosis not present

## 2015-10-07 DIAGNOSIS — G4733 Obstructive sleep apnea (adult) (pediatric): Secondary | ICD-10-CM | POA: Diagnosis not present

## 2015-10-08 DIAGNOSIS — E034 Atrophy of thyroid (acquired): Secondary | ICD-10-CM | POA: Diagnosis not present

## 2015-10-08 DIAGNOSIS — G4733 Obstructive sleep apnea (adult) (pediatric): Secondary | ICD-10-CM | POA: Diagnosis not present

## 2015-10-08 DIAGNOSIS — I482 Chronic atrial fibrillation: Secondary | ICD-10-CM | POA: Diagnosis not present

## 2015-10-08 DIAGNOSIS — J449 Chronic obstructive pulmonary disease, unspecified: Secondary | ICD-10-CM | POA: Diagnosis not present

## 2015-10-09 DIAGNOSIS — I482 Chronic atrial fibrillation: Secondary | ICD-10-CM | POA: Diagnosis not present

## 2015-10-09 DIAGNOSIS — G4733 Obstructive sleep apnea (adult) (pediatric): Secondary | ICD-10-CM | POA: Diagnosis not present

## 2015-10-09 DIAGNOSIS — J449 Chronic obstructive pulmonary disease, unspecified: Secondary | ICD-10-CM | POA: Diagnosis not present

## 2015-10-09 DIAGNOSIS — E034 Atrophy of thyroid (acquired): Secondary | ICD-10-CM | POA: Diagnosis not present

## 2015-10-10 ENCOUNTER — Inpatient Hospital Stay: Payer: Medicare PPO | Attending: Oncology

## 2015-10-10 ENCOUNTER — Encounter: Payer: Self-pay | Admitting: Oncology

## 2015-10-10 ENCOUNTER — Inpatient Hospital Stay (HOSPITAL_BASED_OUTPATIENT_CLINIC_OR_DEPARTMENT_OTHER): Payer: Medicare PPO | Admitting: Oncology

## 2015-10-10 VITALS — BP 121/73 | HR 78 | Temp 97.3°F | Resp 18 | Wt 178.6 lb

## 2015-10-10 DIAGNOSIS — C73 Malignant neoplasm of thyroid gland: Secondary | ICD-10-CM

## 2015-10-10 DIAGNOSIS — Z7982 Long term (current) use of aspirin: Secondary | ICD-10-CM | POA: Insufficient documentation

## 2015-10-10 DIAGNOSIS — R490 Dysphonia: Secondary | ICD-10-CM

## 2015-10-10 DIAGNOSIS — E871 Hypo-osmolality and hyponatremia: Secondary | ICD-10-CM | POA: Diagnosis not present

## 2015-10-10 DIAGNOSIS — R5383 Other fatigue: Secondary | ICD-10-CM

## 2015-10-10 DIAGNOSIS — Z85850 Personal history of malignant neoplasm of thyroid: Secondary | ICD-10-CM | POA: Insufficient documentation

## 2015-10-10 DIAGNOSIS — Z809 Family history of malignant neoplasm, unspecified: Secondary | ICD-10-CM

## 2015-10-10 DIAGNOSIS — Z923 Personal history of irradiation: Secondary | ICD-10-CM | POA: Insufficient documentation

## 2015-10-10 DIAGNOSIS — Z87891 Personal history of nicotine dependence: Secondary | ICD-10-CM | POA: Diagnosis not present

## 2015-10-10 DIAGNOSIS — I509 Heart failure, unspecified: Secondary | ICD-10-CM | POA: Diagnosis not present

## 2015-10-10 DIAGNOSIS — D649 Anemia, unspecified: Secondary | ICD-10-CM | POA: Insufficient documentation

## 2015-10-10 DIAGNOSIS — Z79899 Other long term (current) drug therapy: Secondary | ICD-10-CM

## 2015-10-10 DIAGNOSIS — J449 Chronic obstructive pulmonary disease, unspecified: Secondary | ICD-10-CM | POA: Diagnosis not present

## 2015-10-10 DIAGNOSIS — G4733 Obstructive sleep apnea (adult) (pediatric): Secondary | ICD-10-CM | POA: Diagnosis not present

## 2015-10-10 DIAGNOSIS — R531 Weakness: Secondary | ICD-10-CM

## 2015-10-10 DIAGNOSIS — F329 Major depressive disorder, single episode, unspecified: Secondary | ICD-10-CM | POA: Insufficient documentation

## 2015-10-10 DIAGNOSIS — I4891 Unspecified atrial fibrillation: Secondary | ICD-10-CM | POA: Insufficient documentation

## 2015-10-10 DIAGNOSIS — I482 Chronic atrial fibrillation: Secondary | ICD-10-CM | POA: Diagnosis not present

## 2015-10-10 DIAGNOSIS — E034 Atrophy of thyroid (acquired): Secondary | ICD-10-CM | POA: Diagnosis not present

## 2015-10-10 LAB — CBC WITH DIFFERENTIAL/PLATELET
Basophils Absolute: 0.1 10*3/uL (ref 0–0.1)
Basophils Relative: 1 %
EOS ABS: 0.3 10*3/uL (ref 0–0.7)
Eosinophils Relative: 4 %
HCT: 34.7 % — ABNORMAL LOW (ref 35.0–47.0)
HEMOGLOBIN: 11.5 g/dL — AB (ref 12.0–16.0)
Lymphocytes Relative: 19 %
Lymphs Abs: 1.3 10*3/uL (ref 1.0–3.6)
MCH: 25.7 pg — ABNORMAL LOW (ref 26.0–34.0)
MCHC: 33.2 g/dL (ref 32.0–36.0)
MCV: 77.2 fL — ABNORMAL LOW (ref 80.0–100.0)
Monocytes Absolute: 0.7 10*3/uL (ref 0.2–0.9)
Monocytes Relative: 9 %
NEUTROS PCT: 67 %
Neutro Abs: 4.9 10*3/uL (ref 1.4–6.5)
Platelets: 252 10*3/uL (ref 150–440)
RBC: 4.49 MIL/uL (ref 3.80–5.20)
RDW: 16 % — ABNORMAL HIGH (ref 11.5–14.5)
WBC: 7.3 10*3/uL (ref 3.6–11.0)

## 2015-10-10 LAB — COMPREHENSIVE METABOLIC PANEL
ALBUMIN: 4.3 g/dL (ref 3.5–5.0)
ALK PHOS: 58 U/L (ref 38–126)
ALT: 22 U/L (ref 14–54)
ANION GAP: 9 (ref 5–15)
AST: 29 U/L (ref 15–41)
BUN: 22 mg/dL — ABNORMAL HIGH (ref 6–20)
CALCIUM: 7.6 mg/dL — AB (ref 8.9–10.3)
CHLORIDE: 93 mmol/L — AB (ref 101–111)
CO2: 32 mmol/L (ref 22–32)
Creatinine, Ser: 0.93 mg/dL (ref 0.44–1.00)
GFR calc Af Amer: >60 mL/min (ref >60)
GFR calc non Af Amer: 56 mL/min — ABNORMAL LOW (ref >60)
GLUCOSE: 88 mg/dL (ref 65–99)
Potassium: 3.9 mmol/L (ref 3.5–5.1)
SODIUM: 134 mmol/L — AB (ref 135–145)
Total Bilirubin: 0.9 mg/dL (ref 0.3–1.2)
Total Protein: 7.8 g/dL (ref 6.5–8.1)

## 2015-10-10 LAB — TSH: TSH: 4.327 u[IU]/mL (ref 0.350–4.500)

## 2015-10-10 NOTE — Progress Notes (Signed)
[place] @ [facility] [phone]  Fax:(336) [place]: 08-Nov-1931  MR#: [id number]  JYN#:[id number]  Patient Care Team: Madelyn Brunner, MD as PCP - General (Internal Medicine)  CHIEF COMPLAINT:  Chief Complaint  Patient presents with  . Thyroid Cancer    Oncology History   82 year old female with right vocal cord paralysis and recurrent papillary thyroid cancer biopsy proven patient had diagnosis of thyroid cancer in 2004 had thyroidectomy followed by radioactive iodine treatment at  [facility].  initial diagnosis of papillary thyroid carcinoma  pT3 pN2 status post thyroidectomy in 2004 Recent endobronchial ultrasound-guided biopsies positive for papillary carcinoma 2.palliative radiation therapy started in May of 2014 3.patient has finished 7000 rads to the laryngeal area in June of 2014     Recurrent thyroid cancer [facility])  82 year old lady with recurrent thyroid cancer came today further followup he continues still hoarseness of voice.  Has finished radiation therapy.  No soreness in the mouth.  No difficulty in swallowing.  Appetite has been stable.  Here for ongoing evaluation and treatment consideration.i patient lives in  assisted living facilit Continues to have hoarseness of voice. No significant other changes patient continues to ambulate with the help of walker  INTERVAL HISTORY:\ 82 year old lady with recurrent thyroid cancer came today further follow-up. Hoarseness of voice persists. No difficulty swallowing. No other significant new complaints. REVIEW OF SYSTEMS:   Gen. status: Feels weak and tired.  But not in any acute distress HEENT: Hoarseness of voice not changed no difficulty in swallowing Cardiac: No chest pain no shortness of breath no paroxysmal nocturnal dyspnea Lungs: No cough no hemoptysis no chest pain GI: Nausea no vomiting no diarrhea GU: No dysuria hematuria Musculoskeletal system no bony pain Skin: No  rash Neurological system no headache no dizziness As per HPI. Otherwise, a complete review of systems is negatve.  PAST MEDICAL HISTORY: Past Medical History  Diagnosis Date  . Cancer ([place])     Thyroid  . Recurrent thyroid cancer ([place]) 03/28/2015    Significant History/PMH:   COPD:    hyponatermia:    CHF:    atrial fibrillation:    Depression:    Thyroid CA:    Hypocalcemia:    Appendectomy:    Thyroid removed:   Preventive Screening:  Has patient had any of the following test? Colonscopy  Mammography (1)   Last Colonoscopy: 2009(1)   Last Mammography: ? 2004(1)   Smoking History: Smoking History Quit 2010(1)  PFSH: Comments: Mother died of natural causes at age 63.  Father died cancer at age 73.  One brother died of cancer.  (Unspecified)  Social History: negative alcohol  Comments: Does not smoke now  Additional Past Medical and Surgical History: COPD  As mentioned above   ADVANCED DIRECTIVES:  Patient does not have living will HEALTH MAINTENANCE: Social History  Substance Use Topics  . Smoking status: Never Smoker   . Smokeless tobacco: None  . Alcohol Use: None      No Known Allergies  Current Outpatient Prescriptions  Medication Sig Dispense Refill  . acetaminophen (TYLENOL) 325 MG tablet Take by mouth.    Marland Kitchen alendronate (FOSAMAX) 70 MG tablet TAKE (1) ONCE A WEEK 30 MINUTES BEFORE BREAKFAST WITH FULL GLASS OF WA TER. DO NOT LIE DOWN AFTER.    Marland Kitchen antiseptic oral rinse (BIOTENE) LIQD 15 mLs by Mouth Rinse route 2 (two) times daily. 473 mL 3  . ARIPiprazole (ABILIFY) 2 MG  tablet Take by mouth.    . Artificial Saliva (BIOTENE MOISTURIZING MOUTH) SOLN SWISH 5-10ML BY MOUTH AND THEN SPIT TWO TIMES A DAY 473 mL 1  . Artificial Saliva (BIOTENE MOISTURIZING MOUTH) SOLN SWISH 5-10ML BY MOUTH AND THEN SPIT TWO TIMES A DAY 473 mL 0  . aspirin EC 81 MG tablet TAKE 1 TABLET BY MOUTH ONCE DAILY.    Marland Kitchen atorvastatin (LIPITOR) 20 MG tablet TAKE 1 TABLET BY  MOUTH ONCE DAILY FOR CHOLESTEROL    . calcitRIOL (ROCALTROL) 0.25 MCG capsule TAKE (1) CAPSULE BY MOUTH TWICE DAILY.    . calcium carbonate (CALCIUM 600) 600 MG TABS tablet TAKE (2) TABLETS BY MOUTH THREE TIMES DAILY.    . cetirizine (ZYRTEC) 10 MG tablet TAKE 1 TABLET BY MOUTH ONCE DAILY.    Marland Kitchen clopidogrel (PLAVIX) 75 MG tablet TAKE 1 TABLET BY MOUTH ONCE DAILY.    . digoxin (DIGOX) 0.125 MG tablet TAKE 1 TABLET BY MOUTH ONCE DAILY.    Marland Kitchen docusate sodium (COLACE) 100 MG capsule TAKE (1) CAPSULE BY MOUTH TWICE DAILY.    . DULERA 100-5 MCG/ACT AERO     . enalapril (VASOTEC) 2.5 MG tablet TAKE (1) TABLET BY MOUTH DAILY FOR HIGH BLOOD PRESSURE.    Marland Kitchen escitalopram (LEXAPRO) 20 MG tablet Take by mouth.    . ferrous sulfate 325 (65 FE) MG tablet TAKE 1 TABLET BY MOUTH ONCE DAILY.    . fluticasone (FLONASE) 50 MCG/ACT nasal spray Place into the nose.    . furosemide (LASIX) 20 MG tablet TAKE 1 TABLET BY MOUTH ONCE DAILY.    Marland Kitchen levothyroxine (SYNTHROID, LEVOTHROID) 137 MCG tablet TAKE 1 TABLET BY MOUTH ONCE DAILY.    . metoprolol tartrate (LOPRESSOR) 25 MG tablet TAKE 1 TABLET BY MOUTH TWICE DAILY    . Multiple Vitamins-Minerals (I-VITE) TABS TAKE 1 TABLET BY MOUTH ONCE DAILY.    . pantoprazole (PROTONIX) 40 MG tablet TAKE 1 TABLET BY MOUTH ONCE DAILY.    Marland Kitchen potassium chloride SA (K-DUR,KLOR-CON) 20 MEQ tablet TAKE 1 TABLET BY MOUTH ONCE DAILY.    Marland Kitchen tiotropium (SPIRIVA HANDIHALER) 18 MCG inhalation capsule INHALE 1 CAPSULE AS DIRECTED ONCE DAILY.    Marland Kitchen ipratropium-albuterol (DUONEB) 0.5-2.5 (3) MG/3ML SOLN Inhale into the lungs.     No current facility-administered medications for this visit.    OBJECTIVE:  Filed Vitals:   10/10/15 0941  BP: 121/73  Pulse: 78  Temp: 97.3 F (36.3 C)  Resp: 18     There is no height on file to calculate BMI.    ECOG FS:2 - Symptomatic, <50% confined to bed  PHYSICAL EXAM: Gen. status: Patient is alert and oriented not any acute distress HEENT: No palpable  thyroid mass.  Mouth within normal limit.  No soreness.  No stomatitis Cardiac: Irregular heart sounds history of atrial fibrillation Lungs: At entry equal on both sides dictation's.  No rhonchi.  No rales Lymphatic system: Supraclavicular, cervical, axillary, inguinal lymph nodes are not palpable Abdominal exam revealed normal bowel sounds. The abdomen was soft, non-tender, and without masses, organomegaly, or appreciable enlargement of the abdominal aorta. Neurologically, the patient was awake, alert, and oriented to person, place and time. There were no obvious focal neurologic abnormalities. Examination of the skin revealed no evidence of significant rashes, suspicious appearing nevi or other concerning lesions. Lower extremity no edema    LAB RESULTS:  Appointment on 10/10/2015  Component Date Value Ref Range Status  . WBC 10/10/2015 7.3  3.6 - 11.0  K/uL Final  . RBC 10/10/2015 4.49  3.80 - 5.20 MIL/uL Final  . Hemoglobin 10/10/2015 11.5* 12.0 - 16.0 g/dL Final  . HCT 10/10/2015 34.7* 35.0 - 47.0 % Final  . MCV 10/10/2015 77.2* 80.0 - 100.0 fL Final  . MCH 10/10/2015 25.7* 26.0 - 34.0 pg Final  . MCHC 10/10/2015 33.2  32.0 - 36.0 g/dL Final  . RDW 10/10/2015 16.0* 11.5 - 14.5 % Final  . Platelets 10/10/2015 252  150 - 440 K/uL Final  . Neutrophils Relative % 10/10/2015 67   Final  . Neutro Abs 10/10/2015 4.9  1.4 - 6.5 K/uL Final  . Lymphocytes Relative 10/10/2015 19   Final  . Lymphs Abs 10/10/2015 1.3  1.0 - 3.6 K/uL Final  . Monocytes Relative 10/10/2015 9   Final  . Monocytes Absolute 10/10/2015 0.7  0.2 - 0.9 K/uL Final  . Eosinophils Relative 10/10/2015 4   Final  . Eosinophils Absolute 10/10/2015 0.3  0 - 0.7 K/uL Final  . Basophils Relative 10/10/2015 1   Final  . Basophils Absolute 10/10/2015 0.1  0 - 0.1 K/uL Final  . Sodium 10/10/2015 134* 135 - 145 mmol/L Final  . Potassium 10/10/2015 3.9  3.5 - 5.1 mmol/L Final  . Chloride 10/10/2015 93* 101 - 111 mmol/L Final    . CO2 10/10/2015 32  22 - 32 mmol/L Final  . Glucose, Bld 10/10/2015 88  65 - 99 mg/dL Final  . BUN 10/10/2015 22* 6 - 20 mg/dL Final  . Creatinine, Ser 10/10/2015 0.93  0.44 - 1.00 mg/dL Final  . Calcium 10/10/2015 7.6* 8.9 - 10.3 mg/dL Final  . Total Protein 10/10/2015 7.8  6.5 - 8.1 g/dL Final  . Albumin 10/10/2015 4.3  3.5 - 5.0 g/dL Final  . AST 10/10/2015 29  15 - 41 U/L Final  . ALT 10/10/2015 22  14 - 54 U/L Final  . Alkaline Phosphatase 10/10/2015 58  38 - 126 U/L Final  . Total Bilirubin 10/10/2015 0.9  0.3 - 1.2 mg/dL Final  . GFR calc non Af Amer 10/10/2015 56* >60 mL/min Final  . GFR calc Af Amer 10/10/2015 >60  >60 mL/min Final   Comment: (NOTE) The eGFR has been calculated using the CKD EPI equation. This calculation has not been validated in all clinical situations. eGFR's persistently <60 mL/min signify possible Chronic Kidney Disease.   . Anion gap 10/10/2015 9  5 - 15 Final      ASSESSMENT: Recurrent carcinoma thyroid Anemia stable and improving. Hoarseness of voice.  Unchanged. Patient is on 137 g of Synthroid and will be adjusted depending on T4 and TSH.  The thyroglobulin keeps on climbing that a PET scan would be ordered.  On clinical examination there is no evidence of recurrent or progressive disease   MEDICAL DECISION MAKING:  All lab data has been reviewed.  T4 TSH within acceptable range.  Continue same dose of thyroid. Patient expressed understanding and was in agreement with this plan. She also understands that She can call clinic at any time with any questions, concerns, or complaints.    No matching staging information was found for the patient.  Forest Gleason, MD   10/10/2015 9:58 AM

## 2015-10-11 DIAGNOSIS — G4733 Obstructive sleep apnea (adult) (pediatric): Secondary | ICD-10-CM | POA: Diagnosis not present

## 2015-10-11 DIAGNOSIS — F329 Major depressive disorder, single episode, unspecified: Secondary | ICD-10-CM | POA: Diagnosis not present

## 2015-10-11 DIAGNOSIS — E034 Atrophy of thyroid (acquired): Secondary | ICD-10-CM | POA: Diagnosis not present

## 2015-10-11 DIAGNOSIS — I482 Chronic atrial fibrillation: Secondary | ICD-10-CM | POA: Diagnosis not present

## 2015-10-11 DIAGNOSIS — J449 Chronic obstructive pulmonary disease, unspecified: Secondary | ICD-10-CM | POA: Diagnosis not present

## 2015-10-12 ENCOUNTER — Ambulatory Visit: Payer: Medicare PPO | Admitting: Oncology

## 2015-10-12 ENCOUNTER — Other Ambulatory Visit: Payer: Medicare PPO

## 2015-10-12 DIAGNOSIS — I482 Chronic atrial fibrillation: Secondary | ICD-10-CM | POA: Diagnosis not present

## 2015-10-12 DIAGNOSIS — E034 Atrophy of thyroid (acquired): Secondary | ICD-10-CM | POA: Diagnosis not present

## 2015-10-12 DIAGNOSIS — G4733 Obstructive sleep apnea (adult) (pediatric): Secondary | ICD-10-CM | POA: Diagnosis not present

## 2015-10-12 DIAGNOSIS — J449 Chronic obstructive pulmonary disease, unspecified: Secondary | ICD-10-CM | POA: Diagnosis not present

## 2015-10-13 DIAGNOSIS — I482 Chronic atrial fibrillation: Secondary | ICD-10-CM | POA: Diagnosis not present

## 2015-10-13 DIAGNOSIS — G4733 Obstructive sleep apnea (adult) (pediatric): Secondary | ICD-10-CM | POA: Diagnosis not present

## 2015-10-13 DIAGNOSIS — E034 Atrophy of thyroid (acquired): Secondary | ICD-10-CM | POA: Diagnosis not present

## 2015-10-13 DIAGNOSIS — J449 Chronic obstructive pulmonary disease, unspecified: Secondary | ICD-10-CM | POA: Diagnosis not present

## 2015-10-14 DIAGNOSIS — R0902 Hypoxemia: Secondary | ICD-10-CM | POA: Diagnosis not present

## 2015-10-14 DIAGNOSIS — J449 Chronic obstructive pulmonary disease, unspecified: Secondary | ICD-10-CM | POA: Diagnosis not present

## 2015-10-14 DIAGNOSIS — I482 Chronic atrial fibrillation: Secondary | ICD-10-CM | POA: Diagnosis not present

## 2015-10-14 DIAGNOSIS — E034 Atrophy of thyroid (acquired): Secondary | ICD-10-CM | POA: Diagnosis not present

## 2015-10-14 DIAGNOSIS — G4733 Obstructive sleep apnea (adult) (pediatric): Secondary | ICD-10-CM | POA: Diagnosis not present

## 2015-10-15 DIAGNOSIS — J449 Chronic obstructive pulmonary disease, unspecified: Secondary | ICD-10-CM | POA: Diagnosis not present

## 2015-10-15 DIAGNOSIS — I482 Chronic atrial fibrillation: Secondary | ICD-10-CM | POA: Diagnosis not present

## 2015-10-15 DIAGNOSIS — E034 Atrophy of thyroid (acquired): Secondary | ICD-10-CM | POA: Diagnosis not present

## 2015-10-15 DIAGNOSIS — G4733 Obstructive sleep apnea (adult) (pediatric): Secondary | ICD-10-CM | POA: Diagnosis not present

## 2015-10-15 LAB — T4: T4, Total: 10.3 ug/dL (ref 4.5–12.0)

## 2015-10-15 LAB — THYROGLOBULIN LEVEL: THYROGLOBULIN: 4.9 ng/mL

## 2015-10-16 DIAGNOSIS — E034 Atrophy of thyroid (acquired): Secondary | ICD-10-CM | POA: Diagnosis not present

## 2015-10-16 DIAGNOSIS — J449 Chronic obstructive pulmonary disease, unspecified: Secondary | ICD-10-CM | POA: Diagnosis not present

## 2015-10-16 DIAGNOSIS — G4733 Obstructive sleep apnea (adult) (pediatric): Secondary | ICD-10-CM | POA: Diagnosis not present

## 2015-10-16 DIAGNOSIS — I482 Chronic atrial fibrillation: Secondary | ICD-10-CM | POA: Diagnosis not present

## 2015-10-17 DIAGNOSIS — G4733 Obstructive sleep apnea (adult) (pediatric): Secondary | ICD-10-CM | POA: Diagnosis not present

## 2015-10-17 DIAGNOSIS — Z961 Presence of intraocular lens: Secondary | ICD-10-CM | POA: Diagnosis not present

## 2015-10-17 DIAGNOSIS — H353130 Nonexudative age-related macular degeneration, bilateral, stage unspecified: Secondary | ICD-10-CM | POA: Diagnosis not present

## 2015-10-17 DIAGNOSIS — I482 Chronic atrial fibrillation: Secondary | ICD-10-CM | POA: Diagnosis not present

## 2015-10-17 DIAGNOSIS — H26492 Other secondary cataract, left eye: Secondary | ICD-10-CM | POA: Diagnosis not present

## 2015-10-17 DIAGNOSIS — J449 Chronic obstructive pulmonary disease, unspecified: Secondary | ICD-10-CM | POA: Diagnosis not present

## 2015-10-17 DIAGNOSIS — E034 Atrophy of thyroid (acquired): Secondary | ICD-10-CM | POA: Diagnosis not present

## 2015-10-18 DIAGNOSIS — G4733 Obstructive sleep apnea (adult) (pediatric): Secondary | ICD-10-CM | POA: Diagnosis not present

## 2015-10-18 DIAGNOSIS — I482 Chronic atrial fibrillation: Secondary | ICD-10-CM | POA: Diagnosis not present

## 2015-10-18 DIAGNOSIS — J449 Chronic obstructive pulmonary disease, unspecified: Secondary | ICD-10-CM | POA: Diagnosis not present

## 2015-10-18 DIAGNOSIS — E034 Atrophy of thyroid (acquired): Secondary | ICD-10-CM | POA: Diagnosis not present

## 2015-10-19 DIAGNOSIS — E034 Atrophy of thyroid (acquired): Secondary | ICD-10-CM | POA: Diagnosis not present

## 2015-10-19 DIAGNOSIS — G4733 Obstructive sleep apnea (adult) (pediatric): Secondary | ICD-10-CM | POA: Diagnosis not present

## 2015-10-19 DIAGNOSIS — J449 Chronic obstructive pulmonary disease, unspecified: Secondary | ICD-10-CM | POA: Diagnosis not present

## 2015-10-19 DIAGNOSIS — I482 Chronic atrial fibrillation: Secondary | ICD-10-CM | POA: Diagnosis not present

## 2015-10-20 DIAGNOSIS — E034 Atrophy of thyroid (acquired): Secondary | ICD-10-CM | POA: Diagnosis not present

## 2015-10-20 DIAGNOSIS — J449 Chronic obstructive pulmonary disease, unspecified: Secondary | ICD-10-CM | POA: Diagnosis not present

## 2015-10-20 DIAGNOSIS — G4733 Obstructive sleep apnea (adult) (pediatric): Secondary | ICD-10-CM | POA: Diagnosis not present

## 2015-10-20 DIAGNOSIS — I482 Chronic atrial fibrillation: Secondary | ICD-10-CM | POA: Diagnosis not present

## 2015-10-21 DIAGNOSIS — I482 Chronic atrial fibrillation: Secondary | ICD-10-CM | POA: Diagnosis not present

## 2015-10-21 DIAGNOSIS — E034 Atrophy of thyroid (acquired): Secondary | ICD-10-CM | POA: Diagnosis not present

## 2015-10-21 DIAGNOSIS — G4733 Obstructive sleep apnea (adult) (pediatric): Secondary | ICD-10-CM | POA: Diagnosis not present

## 2015-10-21 DIAGNOSIS — J449 Chronic obstructive pulmonary disease, unspecified: Secondary | ICD-10-CM | POA: Diagnosis not present

## 2015-10-22 DIAGNOSIS — E034 Atrophy of thyroid (acquired): Secondary | ICD-10-CM | POA: Diagnosis not present

## 2015-10-22 DIAGNOSIS — I482 Chronic atrial fibrillation: Secondary | ICD-10-CM | POA: Diagnosis not present

## 2015-10-22 DIAGNOSIS — G4733 Obstructive sleep apnea (adult) (pediatric): Secondary | ICD-10-CM | POA: Diagnosis not present

## 2015-10-22 DIAGNOSIS — J449 Chronic obstructive pulmonary disease, unspecified: Secondary | ICD-10-CM | POA: Diagnosis not present

## 2015-10-23 DIAGNOSIS — J449 Chronic obstructive pulmonary disease, unspecified: Secondary | ICD-10-CM | POA: Diagnosis not present

## 2015-10-23 DIAGNOSIS — E034 Atrophy of thyroid (acquired): Secondary | ICD-10-CM | POA: Diagnosis not present

## 2015-10-23 DIAGNOSIS — G4733 Obstructive sleep apnea (adult) (pediatric): Secondary | ICD-10-CM | POA: Diagnosis not present

## 2015-10-23 DIAGNOSIS — I482 Chronic atrial fibrillation: Secondary | ICD-10-CM | POA: Diagnosis not present

## 2015-10-24 DIAGNOSIS — E034 Atrophy of thyroid (acquired): Secondary | ICD-10-CM | POA: Diagnosis not present

## 2015-10-24 DIAGNOSIS — H353122 Nonexudative age-related macular degeneration, left eye, intermediate dry stage: Secondary | ICD-10-CM | POA: Diagnosis not present

## 2015-10-24 DIAGNOSIS — J449 Chronic obstructive pulmonary disease, unspecified: Secondary | ICD-10-CM | POA: Diagnosis not present

## 2015-10-24 DIAGNOSIS — Z961 Presence of intraocular lens: Secondary | ICD-10-CM | POA: Diagnosis not present

## 2015-10-24 DIAGNOSIS — H353112 Nonexudative age-related macular degeneration, right eye, intermediate dry stage: Secondary | ICD-10-CM | POA: Diagnosis not present

## 2015-10-24 DIAGNOSIS — H35342 Macular cyst, hole, or pseudohole, left eye: Secondary | ICD-10-CM | POA: Diagnosis not present

## 2015-10-24 DIAGNOSIS — I482 Chronic atrial fibrillation: Secondary | ICD-10-CM | POA: Diagnosis not present

## 2015-10-24 DIAGNOSIS — H5203 Hypermetropia, bilateral: Secondary | ICD-10-CM | POA: Diagnosis not present

## 2015-10-24 DIAGNOSIS — G4733 Obstructive sleep apnea (adult) (pediatric): Secondary | ICD-10-CM | POA: Diagnosis not present

## 2015-10-24 DIAGNOSIS — H26492 Other secondary cataract, left eye: Secondary | ICD-10-CM | POA: Diagnosis not present

## 2015-10-24 DIAGNOSIS — H26491 Other secondary cataract, right eye: Secondary | ICD-10-CM | POA: Diagnosis not present

## 2015-10-24 DIAGNOSIS — H35341 Macular cyst, hole, or pseudohole, right eye: Secondary | ICD-10-CM | POA: Diagnosis not present

## 2015-10-24 DIAGNOSIS — H52223 Regular astigmatism, bilateral: Secondary | ICD-10-CM | POA: Diagnosis not present

## 2015-10-25 DIAGNOSIS — R0902 Hypoxemia: Secondary | ICD-10-CM | POA: Diagnosis not present

## 2015-10-25 DIAGNOSIS — R32 Unspecified urinary incontinence: Secondary | ICD-10-CM | POA: Diagnosis not present

## 2015-10-25 DIAGNOSIS — J449 Chronic obstructive pulmonary disease, unspecified: Secondary | ICD-10-CM | POA: Diagnosis not present

## 2015-10-25 DIAGNOSIS — G4733 Obstructive sleep apnea (adult) (pediatric): Secondary | ICD-10-CM | POA: Diagnosis not present

## 2015-10-25 DIAGNOSIS — I482 Chronic atrial fibrillation: Secondary | ICD-10-CM | POA: Diagnosis not present

## 2015-10-25 DIAGNOSIS — E034 Atrophy of thyroid (acquired): Secondary | ICD-10-CM | POA: Diagnosis not present

## 2015-10-26 DIAGNOSIS — G4733 Obstructive sleep apnea (adult) (pediatric): Secondary | ICD-10-CM | POA: Diagnosis not present

## 2015-10-26 DIAGNOSIS — J449 Chronic obstructive pulmonary disease, unspecified: Secondary | ICD-10-CM | POA: Diagnosis not present

## 2015-10-26 DIAGNOSIS — E034 Atrophy of thyroid (acquired): Secondary | ICD-10-CM | POA: Diagnosis not present

## 2015-10-26 DIAGNOSIS — I482 Chronic atrial fibrillation: Secondary | ICD-10-CM | POA: Diagnosis not present

## 2015-10-27 DIAGNOSIS — E034 Atrophy of thyroid (acquired): Secondary | ICD-10-CM | POA: Diagnosis not present

## 2015-10-27 DIAGNOSIS — J449 Chronic obstructive pulmonary disease, unspecified: Secondary | ICD-10-CM | POA: Diagnosis not present

## 2015-10-27 DIAGNOSIS — G4733 Obstructive sleep apnea (adult) (pediatric): Secondary | ICD-10-CM | POA: Diagnosis not present

## 2015-10-27 DIAGNOSIS — I482 Chronic atrial fibrillation: Secondary | ICD-10-CM | POA: Diagnosis not present

## 2015-10-28 DIAGNOSIS — G4733 Obstructive sleep apnea (adult) (pediatric): Secondary | ICD-10-CM | POA: Diagnosis not present

## 2015-10-28 DIAGNOSIS — J449 Chronic obstructive pulmonary disease, unspecified: Secondary | ICD-10-CM | POA: Diagnosis not present

## 2015-10-28 DIAGNOSIS — I482 Chronic atrial fibrillation: Secondary | ICD-10-CM | POA: Diagnosis not present

## 2015-10-28 DIAGNOSIS — E034 Atrophy of thyroid (acquired): Secondary | ICD-10-CM | POA: Diagnosis not present

## 2015-10-29 DIAGNOSIS — G4733 Obstructive sleep apnea (adult) (pediatric): Secondary | ICD-10-CM | POA: Diagnosis not present

## 2015-10-29 DIAGNOSIS — E034 Atrophy of thyroid (acquired): Secondary | ICD-10-CM | POA: Diagnosis not present

## 2015-10-29 DIAGNOSIS — I482 Chronic atrial fibrillation: Secondary | ICD-10-CM | POA: Diagnosis not present

## 2015-10-29 DIAGNOSIS — J449 Chronic obstructive pulmonary disease, unspecified: Secondary | ICD-10-CM | POA: Diagnosis not present

## 2015-10-30 DIAGNOSIS — I482 Chronic atrial fibrillation: Secondary | ICD-10-CM | POA: Diagnosis not present

## 2015-10-30 DIAGNOSIS — J449 Chronic obstructive pulmonary disease, unspecified: Secondary | ICD-10-CM | POA: Diagnosis not present

## 2015-10-30 DIAGNOSIS — G4733 Obstructive sleep apnea (adult) (pediatric): Secondary | ICD-10-CM | POA: Diagnosis not present

## 2015-10-30 DIAGNOSIS — E034 Atrophy of thyroid (acquired): Secondary | ICD-10-CM | POA: Diagnosis not present

## 2015-10-31 DIAGNOSIS — G4733 Obstructive sleep apnea (adult) (pediatric): Secondary | ICD-10-CM | POA: Diagnosis not present

## 2015-10-31 DIAGNOSIS — J449 Chronic obstructive pulmonary disease, unspecified: Secondary | ICD-10-CM | POA: Diagnosis not present

## 2015-10-31 DIAGNOSIS — E034 Atrophy of thyroid (acquired): Secondary | ICD-10-CM | POA: Diagnosis not present

## 2015-10-31 DIAGNOSIS — I482 Chronic atrial fibrillation: Secondary | ICD-10-CM | POA: Diagnosis not present

## 2015-11-01 DIAGNOSIS — I482 Chronic atrial fibrillation: Secondary | ICD-10-CM | POA: Diagnosis not present

## 2015-11-01 DIAGNOSIS — E034 Atrophy of thyroid (acquired): Secondary | ICD-10-CM | POA: Diagnosis not present

## 2015-11-01 DIAGNOSIS — G4733 Obstructive sleep apnea (adult) (pediatric): Secondary | ICD-10-CM | POA: Diagnosis not present

## 2015-11-01 DIAGNOSIS — J449 Chronic obstructive pulmonary disease, unspecified: Secondary | ICD-10-CM | POA: Diagnosis not present

## 2015-11-02 DIAGNOSIS — Z0000 Encounter for general adult medical examination without abnormal findings: Secondary | ICD-10-CM | POA: Diagnosis not present

## 2015-11-02 DIAGNOSIS — R413 Other amnesia: Secondary | ICD-10-CM | POA: Diagnosis not present

## 2015-11-02 DIAGNOSIS — G4733 Obstructive sleep apnea (adult) (pediatric): Secondary | ICD-10-CM | POA: Diagnosis not present

## 2015-11-02 DIAGNOSIS — E538 Deficiency of other specified B group vitamins: Secondary | ICD-10-CM | POA: Diagnosis not present

## 2015-11-02 DIAGNOSIS — J449 Chronic obstructive pulmonary disease, unspecified: Secondary | ICD-10-CM | POA: Diagnosis not present

## 2015-11-02 DIAGNOSIS — G301 Alzheimer's disease with late onset: Secondary | ICD-10-CM | POA: Diagnosis not present

## 2015-11-02 DIAGNOSIS — I482 Chronic atrial fibrillation: Secondary | ICD-10-CM | POA: Diagnosis not present

## 2015-11-02 DIAGNOSIS — E034 Atrophy of thyroid (acquired): Secondary | ICD-10-CM | POA: Diagnosis not present

## 2015-11-02 DIAGNOSIS — F0280 Dementia in other diseases classified elsewhere without behavioral disturbance: Secondary | ICD-10-CM | POA: Diagnosis not present

## 2015-11-03 DIAGNOSIS — I482 Chronic atrial fibrillation: Secondary | ICD-10-CM | POA: Diagnosis not present

## 2015-11-03 DIAGNOSIS — E034 Atrophy of thyroid (acquired): Secondary | ICD-10-CM | POA: Diagnosis not present

## 2015-11-03 DIAGNOSIS — J449 Chronic obstructive pulmonary disease, unspecified: Secondary | ICD-10-CM | POA: Diagnosis not present

## 2015-11-03 DIAGNOSIS — G4733 Obstructive sleep apnea (adult) (pediatric): Secondary | ICD-10-CM | POA: Diagnosis not present

## 2015-11-04 DIAGNOSIS — E034 Atrophy of thyroid (acquired): Secondary | ICD-10-CM | POA: Diagnosis not present

## 2015-11-04 DIAGNOSIS — G4733 Obstructive sleep apnea (adult) (pediatric): Secondary | ICD-10-CM | POA: Diagnosis not present

## 2015-11-04 DIAGNOSIS — I482 Chronic atrial fibrillation: Secondary | ICD-10-CM | POA: Diagnosis not present

## 2015-11-04 DIAGNOSIS — J449 Chronic obstructive pulmonary disease, unspecified: Secondary | ICD-10-CM | POA: Diagnosis not present

## 2015-11-05 DIAGNOSIS — I482 Chronic atrial fibrillation: Secondary | ICD-10-CM | POA: Diagnosis not present

## 2015-11-05 DIAGNOSIS — E034 Atrophy of thyroid (acquired): Secondary | ICD-10-CM | POA: Diagnosis not present

## 2015-11-05 DIAGNOSIS — J449 Chronic obstructive pulmonary disease, unspecified: Secondary | ICD-10-CM | POA: Diagnosis not present

## 2015-11-05 DIAGNOSIS — G4733 Obstructive sleep apnea (adult) (pediatric): Secondary | ICD-10-CM | POA: Diagnosis not present

## 2015-11-06 DIAGNOSIS — G4733 Obstructive sleep apnea (adult) (pediatric): Secondary | ICD-10-CM | POA: Diagnosis not present

## 2015-11-06 DIAGNOSIS — J449 Chronic obstructive pulmonary disease, unspecified: Secondary | ICD-10-CM | POA: Diagnosis not present

## 2015-11-06 DIAGNOSIS — I482 Chronic atrial fibrillation: Secondary | ICD-10-CM | POA: Diagnosis not present

## 2015-11-06 DIAGNOSIS — E034 Atrophy of thyroid (acquired): Secondary | ICD-10-CM | POA: Diagnosis not present

## 2015-11-07 DIAGNOSIS — E034 Atrophy of thyroid (acquired): Secondary | ICD-10-CM | POA: Diagnosis not present

## 2015-11-07 DIAGNOSIS — G4733 Obstructive sleep apnea (adult) (pediatric): Secondary | ICD-10-CM | POA: Diagnosis not present

## 2015-11-07 DIAGNOSIS — I482 Chronic atrial fibrillation: Secondary | ICD-10-CM | POA: Diagnosis not present

## 2015-11-07 DIAGNOSIS — H26491 Other secondary cataract, right eye: Secondary | ICD-10-CM | POA: Diagnosis not present

## 2015-11-07 DIAGNOSIS — J449 Chronic obstructive pulmonary disease, unspecified: Secondary | ICD-10-CM | POA: Diagnosis not present

## 2015-11-08 DIAGNOSIS — G4733 Obstructive sleep apnea (adult) (pediatric): Secondary | ICD-10-CM | POA: Diagnosis not present

## 2015-11-08 DIAGNOSIS — I482 Chronic atrial fibrillation: Secondary | ICD-10-CM | POA: Diagnosis not present

## 2015-11-08 DIAGNOSIS — E034 Atrophy of thyroid (acquired): Secondary | ICD-10-CM | POA: Diagnosis not present

## 2015-11-08 DIAGNOSIS — J449 Chronic obstructive pulmonary disease, unspecified: Secondary | ICD-10-CM | POA: Diagnosis not present

## 2015-11-09 DIAGNOSIS — E034 Atrophy of thyroid (acquired): Secondary | ICD-10-CM | POA: Diagnosis not present

## 2015-11-09 DIAGNOSIS — J449 Chronic obstructive pulmonary disease, unspecified: Secondary | ICD-10-CM | POA: Diagnosis not present

## 2015-11-09 DIAGNOSIS — I482 Chronic atrial fibrillation: Secondary | ICD-10-CM | POA: Diagnosis not present

## 2015-11-09 DIAGNOSIS — G4733 Obstructive sleep apnea (adult) (pediatric): Secondary | ICD-10-CM | POA: Diagnosis not present

## 2015-11-10 DIAGNOSIS — J449 Chronic obstructive pulmonary disease, unspecified: Secondary | ICD-10-CM | POA: Diagnosis not present

## 2015-11-10 DIAGNOSIS — I482 Chronic atrial fibrillation: Secondary | ICD-10-CM | POA: Diagnosis not present

## 2015-11-10 DIAGNOSIS — E034 Atrophy of thyroid (acquired): Secondary | ICD-10-CM | POA: Diagnosis not present

## 2015-11-10 DIAGNOSIS — G4733 Obstructive sleep apnea (adult) (pediatric): Secondary | ICD-10-CM | POA: Diagnosis not present

## 2015-11-11 DIAGNOSIS — J449 Chronic obstructive pulmonary disease, unspecified: Secondary | ICD-10-CM | POA: Diagnosis not present

## 2015-11-11 DIAGNOSIS — E034 Atrophy of thyroid (acquired): Secondary | ICD-10-CM | POA: Diagnosis not present

## 2015-11-11 DIAGNOSIS — G4733 Obstructive sleep apnea (adult) (pediatric): Secondary | ICD-10-CM | POA: Diagnosis not present

## 2015-11-11 DIAGNOSIS — I482 Chronic atrial fibrillation: Secondary | ICD-10-CM | POA: Diagnosis not present

## 2015-11-12 DIAGNOSIS — J449 Chronic obstructive pulmonary disease, unspecified: Secondary | ICD-10-CM | POA: Diagnosis not present

## 2015-11-12 DIAGNOSIS — E034 Atrophy of thyroid (acquired): Secondary | ICD-10-CM | POA: Diagnosis not present

## 2015-11-12 DIAGNOSIS — G4733 Obstructive sleep apnea (adult) (pediatric): Secondary | ICD-10-CM | POA: Diagnosis not present

## 2015-11-12 DIAGNOSIS — I482 Chronic atrial fibrillation: Secondary | ICD-10-CM | POA: Diagnosis not present

## 2015-11-13 DIAGNOSIS — E034 Atrophy of thyroid (acquired): Secondary | ICD-10-CM | POA: Diagnosis not present

## 2015-11-13 DIAGNOSIS — G4733 Obstructive sleep apnea (adult) (pediatric): Secondary | ICD-10-CM | POA: Diagnosis not present

## 2015-11-13 DIAGNOSIS — J449 Chronic obstructive pulmonary disease, unspecified: Secondary | ICD-10-CM | POA: Diagnosis not present

## 2015-11-13 DIAGNOSIS — I482 Chronic atrial fibrillation: Secondary | ICD-10-CM | POA: Diagnosis not present

## 2015-11-14 DIAGNOSIS — J449 Chronic obstructive pulmonary disease, unspecified: Secondary | ICD-10-CM | POA: Diagnosis not present

## 2015-11-14 DIAGNOSIS — I482 Chronic atrial fibrillation: Secondary | ICD-10-CM | POA: Diagnosis not present

## 2015-11-14 DIAGNOSIS — G4733 Obstructive sleep apnea (adult) (pediatric): Secondary | ICD-10-CM | POA: Diagnosis not present

## 2015-11-14 DIAGNOSIS — R0902 Hypoxemia: Secondary | ICD-10-CM | POA: Diagnosis not present

## 2015-11-14 DIAGNOSIS — E034 Atrophy of thyroid (acquired): Secondary | ICD-10-CM | POA: Diagnosis not present

## 2015-11-15 DIAGNOSIS — J449 Chronic obstructive pulmonary disease, unspecified: Secondary | ICD-10-CM | POA: Diagnosis not present

## 2015-11-15 DIAGNOSIS — I482 Chronic atrial fibrillation: Secondary | ICD-10-CM | POA: Diagnosis not present

## 2015-11-15 DIAGNOSIS — E034 Atrophy of thyroid (acquired): Secondary | ICD-10-CM | POA: Diagnosis not present

## 2015-11-15 DIAGNOSIS — G4733 Obstructive sleep apnea (adult) (pediatric): Secondary | ICD-10-CM | POA: Diagnosis not present

## 2015-11-16 DIAGNOSIS — H52223 Regular astigmatism, bilateral: Secondary | ICD-10-CM | POA: Diagnosis not present

## 2015-11-16 DIAGNOSIS — H35341 Macular cyst, hole, or pseudohole, right eye: Secondary | ICD-10-CM | POA: Diagnosis not present

## 2015-11-16 DIAGNOSIS — Z961 Presence of intraocular lens: Secondary | ICD-10-CM | POA: Diagnosis not present

## 2015-11-16 DIAGNOSIS — I482 Chronic atrial fibrillation: Secondary | ICD-10-CM | POA: Diagnosis not present

## 2015-11-16 DIAGNOSIS — J449 Chronic obstructive pulmonary disease, unspecified: Secondary | ICD-10-CM | POA: Diagnosis not present

## 2015-11-16 DIAGNOSIS — H26491 Other secondary cataract, right eye: Secondary | ICD-10-CM | POA: Diagnosis not present

## 2015-11-16 DIAGNOSIS — F0280 Dementia in other diseases classified elsewhere without behavioral disturbance: Secondary | ICD-10-CM | POA: Insufficient documentation

## 2015-11-16 DIAGNOSIS — Z9842 Cataract extraction status, left eye: Secondary | ICD-10-CM | POA: Diagnosis not present

## 2015-11-16 DIAGNOSIS — E034 Atrophy of thyroid (acquired): Secondary | ICD-10-CM | POA: Diagnosis not present

## 2015-11-16 DIAGNOSIS — G301 Alzheimer's disease with late onset: Secondary | ICD-10-CM

## 2015-11-16 DIAGNOSIS — H524 Presbyopia: Secondary | ICD-10-CM | POA: Diagnosis not present

## 2015-11-16 DIAGNOSIS — G4733 Obstructive sleep apnea (adult) (pediatric): Secondary | ICD-10-CM | POA: Diagnosis not present

## 2015-11-16 DIAGNOSIS — H35342 Macular cyst, hole, or pseudohole, left eye: Secondary | ICD-10-CM | POA: Diagnosis not present

## 2015-11-16 DIAGNOSIS — H5203 Hypermetropia, bilateral: Secondary | ICD-10-CM | POA: Diagnosis not present

## 2015-11-17 DIAGNOSIS — J449 Chronic obstructive pulmonary disease, unspecified: Secondary | ICD-10-CM | POA: Diagnosis not present

## 2015-11-17 DIAGNOSIS — I482 Chronic atrial fibrillation: Secondary | ICD-10-CM | POA: Diagnosis not present

## 2015-11-17 DIAGNOSIS — E034 Atrophy of thyroid (acquired): Secondary | ICD-10-CM | POA: Diagnosis not present

## 2015-11-17 DIAGNOSIS — G4733 Obstructive sleep apnea (adult) (pediatric): Secondary | ICD-10-CM | POA: Diagnosis not present

## 2015-11-18 DIAGNOSIS — G4733 Obstructive sleep apnea (adult) (pediatric): Secondary | ICD-10-CM | POA: Diagnosis not present

## 2015-11-18 DIAGNOSIS — J449 Chronic obstructive pulmonary disease, unspecified: Secondary | ICD-10-CM | POA: Diagnosis not present

## 2015-11-18 DIAGNOSIS — E034 Atrophy of thyroid (acquired): Secondary | ICD-10-CM | POA: Diagnosis not present

## 2015-11-18 DIAGNOSIS — I482 Chronic atrial fibrillation: Secondary | ICD-10-CM | POA: Diagnosis not present

## 2015-11-19 DIAGNOSIS — E034 Atrophy of thyroid (acquired): Secondary | ICD-10-CM | POA: Diagnosis not present

## 2015-11-19 DIAGNOSIS — I482 Chronic atrial fibrillation: Secondary | ICD-10-CM | POA: Diagnosis not present

## 2015-11-19 DIAGNOSIS — J449 Chronic obstructive pulmonary disease, unspecified: Secondary | ICD-10-CM | POA: Diagnosis not present

## 2015-11-19 DIAGNOSIS — G4733 Obstructive sleep apnea (adult) (pediatric): Secondary | ICD-10-CM | POA: Diagnosis not present

## 2015-11-20 DIAGNOSIS — J449 Chronic obstructive pulmonary disease, unspecified: Secondary | ICD-10-CM | POA: Diagnosis not present

## 2015-11-20 DIAGNOSIS — E034 Atrophy of thyroid (acquired): Secondary | ICD-10-CM | POA: Diagnosis not present

## 2015-11-20 DIAGNOSIS — I482 Chronic atrial fibrillation: Secondary | ICD-10-CM | POA: Diagnosis not present

## 2015-11-20 DIAGNOSIS — G4733 Obstructive sleep apnea (adult) (pediatric): Secondary | ICD-10-CM | POA: Diagnosis not present

## 2015-11-21 DIAGNOSIS — I482 Chronic atrial fibrillation: Secondary | ICD-10-CM | POA: Diagnosis not present

## 2015-11-21 DIAGNOSIS — G4733 Obstructive sleep apnea (adult) (pediatric): Secondary | ICD-10-CM | POA: Diagnosis not present

## 2015-11-21 DIAGNOSIS — E034 Atrophy of thyroid (acquired): Secondary | ICD-10-CM | POA: Diagnosis not present

## 2015-11-21 DIAGNOSIS — J449 Chronic obstructive pulmonary disease, unspecified: Secondary | ICD-10-CM | POA: Diagnosis not present

## 2015-11-22 DIAGNOSIS — I482 Chronic atrial fibrillation: Secondary | ICD-10-CM | POA: Diagnosis not present

## 2015-11-22 DIAGNOSIS — J449 Chronic obstructive pulmonary disease, unspecified: Secondary | ICD-10-CM | POA: Diagnosis not present

## 2015-11-23 DIAGNOSIS — I482 Chronic atrial fibrillation: Secondary | ICD-10-CM | POA: Diagnosis not present

## 2015-11-23 DIAGNOSIS — J449 Chronic obstructive pulmonary disease, unspecified: Secondary | ICD-10-CM | POA: Diagnosis not present

## 2015-11-24 DIAGNOSIS — J449 Chronic obstructive pulmonary disease, unspecified: Secondary | ICD-10-CM | POA: Diagnosis not present

## 2015-11-24 DIAGNOSIS — I482 Chronic atrial fibrillation: Secondary | ICD-10-CM | POA: Diagnosis not present

## 2015-11-25 DIAGNOSIS — J449 Chronic obstructive pulmonary disease, unspecified: Secondary | ICD-10-CM | POA: Diagnosis not present

## 2015-11-25 DIAGNOSIS — I482 Chronic atrial fibrillation: Secondary | ICD-10-CM | POA: Diagnosis not present

## 2015-11-26 DIAGNOSIS — I482 Chronic atrial fibrillation: Secondary | ICD-10-CM | POA: Diagnosis not present

## 2015-11-26 DIAGNOSIS — J449 Chronic obstructive pulmonary disease, unspecified: Secondary | ICD-10-CM | POA: Diagnosis not present

## 2015-11-27 DIAGNOSIS — I482 Chronic atrial fibrillation: Secondary | ICD-10-CM | POA: Diagnosis not present

## 2015-11-27 DIAGNOSIS — J449 Chronic obstructive pulmonary disease, unspecified: Secondary | ICD-10-CM | POA: Diagnosis not present

## 2015-11-28 DIAGNOSIS — J449 Chronic obstructive pulmonary disease, unspecified: Secondary | ICD-10-CM | POA: Diagnosis not present

## 2015-11-28 DIAGNOSIS — I482 Chronic atrial fibrillation: Secondary | ICD-10-CM | POA: Diagnosis not present

## 2015-11-29 DIAGNOSIS — I482 Chronic atrial fibrillation: Secondary | ICD-10-CM | POA: Diagnosis not present

## 2015-11-29 DIAGNOSIS — J449 Chronic obstructive pulmonary disease, unspecified: Secondary | ICD-10-CM | POA: Diagnosis not present

## 2015-11-30 DIAGNOSIS — J449 Chronic obstructive pulmonary disease, unspecified: Secondary | ICD-10-CM | POA: Diagnosis not present

## 2015-11-30 DIAGNOSIS — I482 Chronic atrial fibrillation: Secondary | ICD-10-CM | POA: Diagnosis not present

## 2015-12-01 DIAGNOSIS — I482 Chronic atrial fibrillation: Secondary | ICD-10-CM | POA: Diagnosis not present

## 2015-12-01 DIAGNOSIS — J449 Chronic obstructive pulmonary disease, unspecified: Secondary | ICD-10-CM | POA: Diagnosis not present

## 2015-12-02 DIAGNOSIS — I482 Chronic atrial fibrillation: Secondary | ICD-10-CM | POA: Diagnosis not present

## 2015-12-02 DIAGNOSIS — J449 Chronic obstructive pulmonary disease, unspecified: Secondary | ICD-10-CM | POA: Diagnosis not present

## 2015-12-03 DIAGNOSIS — J449 Chronic obstructive pulmonary disease, unspecified: Secondary | ICD-10-CM | POA: Diagnosis not present

## 2015-12-03 DIAGNOSIS — I482 Chronic atrial fibrillation: Secondary | ICD-10-CM | POA: Diagnosis not present

## 2015-12-04 DIAGNOSIS — I482 Chronic atrial fibrillation: Secondary | ICD-10-CM | POA: Diagnosis not present

## 2015-12-04 DIAGNOSIS — J449 Chronic obstructive pulmonary disease, unspecified: Secondary | ICD-10-CM | POA: Diagnosis not present

## 2015-12-05 DIAGNOSIS — J449 Chronic obstructive pulmonary disease, unspecified: Secondary | ICD-10-CM | POA: Diagnosis not present

## 2015-12-05 DIAGNOSIS — I482 Chronic atrial fibrillation: Secondary | ICD-10-CM | POA: Diagnosis not present

## 2015-12-06 DIAGNOSIS — J449 Chronic obstructive pulmonary disease, unspecified: Secondary | ICD-10-CM | POA: Diagnosis not present

## 2015-12-06 DIAGNOSIS — I482 Chronic atrial fibrillation: Secondary | ICD-10-CM | POA: Diagnosis not present

## 2015-12-07 DIAGNOSIS — I482 Chronic atrial fibrillation: Secondary | ICD-10-CM | POA: Diagnosis not present

## 2015-12-07 DIAGNOSIS — E034 Atrophy of thyroid (acquired): Secondary | ICD-10-CM | POA: Diagnosis not present

## 2015-12-07 DIAGNOSIS — J449 Chronic obstructive pulmonary disease, unspecified: Secondary | ICD-10-CM | POA: Diagnosis not present

## 2015-12-07 DIAGNOSIS — G4733 Obstructive sleep apnea (adult) (pediatric): Secondary | ICD-10-CM | POA: Diagnosis not present

## 2015-12-08 DIAGNOSIS — E034 Atrophy of thyroid (acquired): Secondary | ICD-10-CM | POA: Diagnosis not present

## 2015-12-08 DIAGNOSIS — I482 Chronic atrial fibrillation: Secondary | ICD-10-CM | POA: Diagnosis not present

## 2015-12-08 DIAGNOSIS — G4733 Obstructive sleep apnea (adult) (pediatric): Secondary | ICD-10-CM | POA: Diagnosis not present

## 2015-12-08 DIAGNOSIS — J449 Chronic obstructive pulmonary disease, unspecified: Secondary | ICD-10-CM | POA: Diagnosis not present

## 2015-12-09 DIAGNOSIS — E034 Atrophy of thyroid (acquired): Secondary | ICD-10-CM | POA: Diagnosis not present

## 2015-12-09 DIAGNOSIS — G4733 Obstructive sleep apnea (adult) (pediatric): Secondary | ICD-10-CM | POA: Diagnosis not present

## 2015-12-09 DIAGNOSIS — J449 Chronic obstructive pulmonary disease, unspecified: Secondary | ICD-10-CM | POA: Diagnosis not present

## 2015-12-09 DIAGNOSIS — I482 Chronic atrial fibrillation: Secondary | ICD-10-CM | POA: Diagnosis not present

## 2015-12-10 DIAGNOSIS — E034 Atrophy of thyroid (acquired): Secondary | ICD-10-CM | POA: Diagnosis not present

## 2015-12-10 DIAGNOSIS — I482 Chronic atrial fibrillation: Secondary | ICD-10-CM | POA: Diagnosis not present

## 2015-12-10 DIAGNOSIS — J449 Chronic obstructive pulmonary disease, unspecified: Secondary | ICD-10-CM | POA: Diagnosis not present

## 2015-12-10 DIAGNOSIS — G4733 Obstructive sleep apnea (adult) (pediatric): Secondary | ICD-10-CM | POA: Diagnosis not present

## 2015-12-11 DIAGNOSIS — G4733 Obstructive sleep apnea (adult) (pediatric): Secondary | ICD-10-CM | POA: Diagnosis not present

## 2015-12-11 DIAGNOSIS — E034 Atrophy of thyroid (acquired): Secondary | ICD-10-CM | POA: Diagnosis not present

## 2015-12-11 DIAGNOSIS — I482 Chronic atrial fibrillation: Secondary | ICD-10-CM | POA: Diagnosis not present

## 2015-12-11 DIAGNOSIS — J449 Chronic obstructive pulmonary disease, unspecified: Secondary | ICD-10-CM | POA: Diagnosis not present

## 2015-12-12 DIAGNOSIS — J449 Chronic obstructive pulmonary disease, unspecified: Secondary | ICD-10-CM | POA: Diagnosis not present

## 2015-12-12 DIAGNOSIS — R0902 Hypoxemia: Secondary | ICD-10-CM | POA: Diagnosis not present

## 2015-12-12 DIAGNOSIS — E034 Atrophy of thyroid (acquired): Secondary | ICD-10-CM | POA: Diagnosis not present

## 2015-12-12 DIAGNOSIS — G4733 Obstructive sleep apnea (adult) (pediatric): Secondary | ICD-10-CM | POA: Diagnosis not present

## 2015-12-12 DIAGNOSIS — I482 Chronic atrial fibrillation: Secondary | ICD-10-CM | POA: Diagnosis not present

## 2015-12-13 DIAGNOSIS — I482 Chronic atrial fibrillation: Secondary | ICD-10-CM | POA: Diagnosis not present

## 2015-12-13 DIAGNOSIS — E034 Atrophy of thyroid (acquired): Secondary | ICD-10-CM | POA: Diagnosis not present

## 2015-12-13 DIAGNOSIS — G4733 Obstructive sleep apnea (adult) (pediatric): Secondary | ICD-10-CM | POA: Diagnosis not present

## 2015-12-13 DIAGNOSIS — J449 Chronic obstructive pulmonary disease, unspecified: Secondary | ICD-10-CM | POA: Diagnosis not present

## 2015-12-14 DIAGNOSIS — I482 Chronic atrial fibrillation: Secondary | ICD-10-CM | POA: Diagnosis not present

## 2015-12-14 DIAGNOSIS — J449 Chronic obstructive pulmonary disease, unspecified: Secondary | ICD-10-CM | POA: Diagnosis not present

## 2015-12-14 DIAGNOSIS — E034 Atrophy of thyroid (acquired): Secondary | ICD-10-CM | POA: Diagnosis not present

## 2015-12-14 DIAGNOSIS — G4733 Obstructive sleep apnea (adult) (pediatric): Secondary | ICD-10-CM | POA: Diagnosis not present

## 2015-12-15 DIAGNOSIS — I482 Chronic atrial fibrillation: Secondary | ICD-10-CM | POA: Diagnosis not present

## 2015-12-15 DIAGNOSIS — E034 Atrophy of thyroid (acquired): Secondary | ICD-10-CM | POA: Diagnosis not present

## 2015-12-15 DIAGNOSIS — J449 Chronic obstructive pulmonary disease, unspecified: Secondary | ICD-10-CM | POA: Diagnosis not present

## 2015-12-15 DIAGNOSIS — G4733 Obstructive sleep apnea (adult) (pediatric): Secondary | ICD-10-CM | POA: Diagnosis not present

## 2015-12-16 DIAGNOSIS — I482 Chronic atrial fibrillation: Secondary | ICD-10-CM | POA: Diagnosis not present

## 2015-12-16 DIAGNOSIS — G4733 Obstructive sleep apnea (adult) (pediatric): Secondary | ICD-10-CM | POA: Diagnosis not present

## 2015-12-16 DIAGNOSIS — J449 Chronic obstructive pulmonary disease, unspecified: Secondary | ICD-10-CM | POA: Diagnosis not present

## 2015-12-16 DIAGNOSIS — E034 Atrophy of thyroid (acquired): Secondary | ICD-10-CM | POA: Diagnosis not present

## 2015-12-17 DIAGNOSIS — G4733 Obstructive sleep apnea (adult) (pediatric): Secondary | ICD-10-CM | POA: Diagnosis not present

## 2015-12-17 DIAGNOSIS — J449 Chronic obstructive pulmonary disease, unspecified: Secondary | ICD-10-CM | POA: Diagnosis not present

## 2015-12-17 DIAGNOSIS — E034 Atrophy of thyroid (acquired): Secondary | ICD-10-CM | POA: Diagnosis not present

## 2015-12-17 DIAGNOSIS — I482 Chronic atrial fibrillation: Secondary | ICD-10-CM | POA: Diagnosis not present

## 2015-12-18 DIAGNOSIS — E034 Atrophy of thyroid (acquired): Secondary | ICD-10-CM | POA: Diagnosis not present

## 2015-12-18 DIAGNOSIS — J449 Chronic obstructive pulmonary disease, unspecified: Secondary | ICD-10-CM | POA: Diagnosis not present

## 2015-12-18 DIAGNOSIS — G4733 Obstructive sleep apnea (adult) (pediatric): Secondary | ICD-10-CM | POA: Diagnosis not present

## 2015-12-18 DIAGNOSIS — I482 Chronic atrial fibrillation: Secondary | ICD-10-CM | POA: Diagnosis not present

## 2015-12-19 DIAGNOSIS — I482 Chronic atrial fibrillation: Secondary | ICD-10-CM | POA: Diagnosis not present

## 2015-12-19 DIAGNOSIS — J449 Chronic obstructive pulmonary disease, unspecified: Secondary | ICD-10-CM | POA: Diagnosis not present

## 2015-12-19 DIAGNOSIS — E034 Atrophy of thyroid (acquired): Secondary | ICD-10-CM | POA: Diagnosis not present

## 2015-12-19 DIAGNOSIS — G4733 Obstructive sleep apnea (adult) (pediatric): Secondary | ICD-10-CM | POA: Diagnosis not present

## 2015-12-20 DIAGNOSIS — E034 Atrophy of thyroid (acquired): Secondary | ICD-10-CM | POA: Diagnosis not present

## 2015-12-20 DIAGNOSIS — J449 Chronic obstructive pulmonary disease, unspecified: Secondary | ICD-10-CM | POA: Diagnosis not present

## 2015-12-20 DIAGNOSIS — I482 Chronic atrial fibrillation: Secondary | ICD-10-CM | POA: Diagnosis not present

## 2015-12-20 DIAGNOSIS — G4733 Obstructive sleep apnea (adult) (pediatric): Secondary | ICD-10-CM | POA: Diagnosis not present

## 2015-12-21 DIAGNOSIS — G4733 Obstructive sleep apnea (adult) (pediatric): Secondary | ICD-10-CM | POA: Diagnosis not present

## 2015-12-21 DIAGNOSIS — J449 Chronic obstructive pulmonary disease, unspecified: Secondary | ICD-10-CM | POA: Diagnosis not present

## 2015-12-21 DIAGNOSIS — I482 Chronic atrial fibrillation: Secondary | ICD-10-CM | POA: Diagnosis not present

## 2015-12-21 DIAGNOSIS — E034 Atrophy of thyroid (acquired): Secondary | ICD-10-CM | POA: Diagnosis not present

## 2015-12-22 DIAGNOSIS — J449 Chronic obstructive pulmonary disease, unspecified: Secondary | ICD-10-CM | POA: Diagnosis not present

## 2015-12-22 DIAGNOSIS — E034 Atrophy of thyroid (acquired): Secondary | ICD-10-CM | POA: Diagnosis not present

## 2015-12-22 DIAGNOSIS — G4733 Obstructive sleep apnea (adult) (pediatric): Secondary | ICD-10-CM | POA: Diagnosis not present

## 2015-12-23 DIAGNOSIS — E034 Atrophy of thyroid (acquired): Secondary | ICD-10-CM | POA: Diagnosis not present

## 2015-12-23 DIAGNOSIS — G4733 Obstructive sleep apnea (adult) (pediatric): Secondary | ICD-10-CM | POA: Diagnosis not present

## 2015-12-23 DIAGNOSIS — J449 Chronic obstructive pulmonary disease, unspecified: Secondary | ICD-10-CM | POA: Diagnosis not present

## 2015-12-24 DIAGNOSIS — E034 Atrophy of thyroid (acquired): Secondary | ICD-10-CM | POA: Diagnosis not present

## 2015-12-24 DIAGNOSIS — G4733 Obstructive sleep apnea (adult) (pediatric): Secondary | ICD-10-CM | POA: Diagnosis not present

## 2015-12-24 DIAGNOSIS — J449 Chronic obstructive pulmonary disease, unspecified: Secondary | ICD-10-CM | POA: Diagnosis not present

## 2015-12-25 DIAGNOSIS — G4733 Obstructive sleep apnea (adult) (pediatric): Secondary | ICD-10-CM | POA: Diagnosis not present

## 2015-12-25 DIAGNOSIS — E034 Atrophy of thyroid (acquired): Secondary | ICD-10-CM | POA: Diagnosis not present

## 2015-12-25 DIAGNOSIS — J449 Chronic obstructive pulmonary disease, unspecified: Secondary | ICD-10-CM | POA: Diagnosis not present

## 2015-12-26 DIAGNOSIS — J449 Chronic obstructive pulmonary disease, unspecified: Secondary | ICD-10-CM | POA: Diagnosis not present

## 2015-12-26 DIAGNOSIS — G4733 Obstructive sleep apnea (adult) (pediatric): Secondary | ICD-10-CM | POA: Diagnosis not present

## 2015-12-26 DIAGNOSIS — E034 Atrophy of thyroid (acquired): Secondary | ICD-10-CM | POA: Diagnosis not present

## 2015-12-27 DIAGNOSIS — G4733 Obstructive sleep apnea (adult) (pediatric): Secondary | ICD-10-CM | POA: Diagnosis not present

## 2015-12-27 DIAGNOSIS — E034 Atrophy of thyroid (acquired): Secondary | ICD-10-CM | POA: Diagnosis not present

## 2015-12-27 DIAGNOSIS — J449 Chronic obstructive pulmonary disease, unspecified: Secondary | ICD-10-CM | POA: Diagnosis not present

## 2015-12-28 DIAGNOSIS — E034 Atrophy of thyroid (acquired): Secondary | ICD-10-CM | POA: Diagnosis not present

## 2015-12-28 DIAGNOSIS — G4733 Obstructive sleep apnea (adult) (pediatric): Secondary | ICD-10-CM | POA: Diagnosis not present

## 2015-12-28 DIAGNOSIS — J449 Chronic obstructive pulmonary disease, unspecified: Secondary | ICD-10-CM | POA: Diagnosis not present

## 2015-12-29 DIAGNOSIS — J449 Chronic obstructive pulmonary disease, unspecified: Secondary | ICD-10-CM | POA: Diagnosis not present

## 2015-12-29 DIAGNOSIS — G4733 Obstructive sleep apnea (adult) (pediatric): Secondary | ICD-10-CM | POA: Diagnosis not present

## 2015-12-29 DIAGNOSIS — E034 Atrophy of thyroid (acquired): Secondary | ICD-10-CM | POA: Diagnosis not present

## 2015-12-30 DIAGNOSIS — G4733 Obstructive sleep apnea (adult) (pediatric): Secondary | ICD-10-CM | POA: Diagnosis not present

## 2015-12-30 DIAGNOSIS — E034 Atrophy of thyroid (acquired): Secondary | ICD-10-CM | POA: Diagnosis not present

## 2015-12-30 DIAGNOSIS — J449 Chronic obstructive pulmonary disease, unspecified: Secondary | ICD-10-CM | POA: Diagnosis not present

## 2015-12-31 DIAGNOSIS — G4733 Obstructive sleep apnea (adult) (pediatric): Secondary | ICD-10-CM | POA: Diagnosis not present

## 2015-12-31 DIAGNOSIS — E034 Atrophy of thyroid (acquired): Secondary | ICD-10-CM | POA: Diagnosis not present

## 2015-12-31 DIAGNOSIS — J449 Chronic obstructive pulmonary disease, unspecified: Secondary | ICD-10-CM | POA: Diagnosis not present

## 2016-01-01 DIAGNOSIS — J449 Chronic obstructive pulmonary disease, unspecified: Secondary | ICD-10-CM | POA: Diagnosis not present

## 2016-01-01 DIAGNOSIS — E034 Atrophy of thyroid (acquired): Secondary | ICD-10-CM | POA: Diagnosis not present

## 2016-01-01 DIAGNOSIS — G4733 Obstructive sleep apnea (adult) (pediatric): Secondary | ICD-10-CM | POA: Diagnosis not present

## 2016-01-02 DIAGNOSIS — E034 Atrophy of thyroid (acquired): Secondary | ICD-10-CM | POA: Diagnosis not present

## 2016-01-02 DIAGNOSIS — J449 Chronic obstructive pulmonary disease, unspecified: Secondary | ICD-10-CM | POA: Diagnosis not present

## 2016-01-02 DIAGNOSIS — G4733 Obstructive sleep apnea (adult) (pediatric): Secondary | ICD-10-CM | POA: Diagnosis not present

## 2016-01-03 DIAGNOSIS — G4733 Obstructive sleep apnea (adult) (pediatric): Secondary | ICD-10-CM | POA: Diagnosis not present

## 2016-01-03 DIAGNOSIS — E034 Atrophy of thyroid (acquired): Secondary | ICD-10-CM | POA: Diagnosis not present

## 2016-01-03 DIAGNOSIS — J449 Chronic obstructive pulmonary disease, unspecified: Secondary | ICD-10-CM | POA: Diagnosis not present

## 2016-01-04 DIAGNOSIS — E034 Atrophy of thyroid (acquired): Secondary | ICD-10-CM | POA: Diagnosis not present

## 2016-01-04 DIAGNOSIS — G4733 Obstructive sleep apnea (adult) (pediatric): Secondary | ICD-10-CM | POA: Diagnosis not present

## 2016-01-04 DIAGNOSIS — J449 Chronic obstructive pulmonary disease, unspecified: Secondary | ICD-10-CM | POA: Diagnosis not present

## 2016-01-05 DIAGNOSIS — J449 Chronic obstructive pulmonary disease, unspecified: Secondary | ICD-10-CM | POA: Diagnosis not present

## 2016-01-05 DIAGNOSIS — E034 Atrophy of thyroid (acquired): Secondary | ICD-10-CM | POA: Diagnosis not present

## 2016-01-05 DIAGNOSIS — G4733 Obstructive sleep apnea (adult) (pediatric): Secondary | ICD-10-CM | POA: Diagnosis not present

## 2016-01-06 DIAGNOSIS — G4733 Obstructive sleep apnea (adult) (pediatric): Secondary | ICD-10-CM | POA: Diagnosis not present

## 2016-01-06 DIAGNOSIS — E034 Atrophy of thyroid (acquired): Secondary | ICD-10-CM | POA: Diagnosis not present

## 2016-01-06 DIAGNOSIS — J449 Chronic obstructive pulmonary disease, unspecified: Secondary | ICD-10-CM | POA: Diagnosis not present

## 2016-01-07 DIAGNOSIS — E034 Atrophy of thyroid (acquired): Secondary | ICD-10-CM | POA: Diagnosis not present

## 2016-01-07 DIAGNOSIS — G4733 Obstructive sleep apnea (adult) (pediatric): Secondary | ICD-10-CM | POA: Diagnosis not present

## 2016-01-07 DIAGNOSIS — J449 Chronic obstructive pulmonary disease, unspecified: Secondary | ICD-10-CM | POA: Diagnosis not present

## 2016-01-08 DIAGNOSIS — E034 Atrophy of thyroid (acquired): Secondary | ICD-10-CM | POA: Diagnosis not present

## 2016-01-08 DIAGNOSIS — J449 Chronic obstructive pulmonary disease, unspecified: Secondary | ICD-10-CM | POA: Diagnosis not present

## 2016-01-08 DIAGNOSIS — G4733 Obstructive sleep apnea (adult) (pediatric): Secondary | ICD-10-CM | POA: Diagnosis not present

## 2016-01-09 DIAGNOSIS — J449 Chronic obstructive pulmonary disease, unspecified: Secondary | ICD-10-CM | POA: Diagnosis not present

## 2016-01-09 DIAGNOSIS — G4733 Obstructive sleep apnea (adult) (pediatric): Secondary | ICD-10-CM | POA: Diagnosis not present

## 2016-01-09 DIAGNOSIS — E034 Atrophy of thyroid (acquired): Secondary | ICD-10-CM | POA: Diagnosis not present

## 2016-01-10 DIAGNOSIS — E034 Atrophy of thyroid (acquired): Secondary | ICD-10-CM | POA: Diagnosis not present

## 2016-01-10 DIAGNOSIS — G4733 Obstructive sleep apnea (adult) (pediatric): Secondary | ICD-10-CM | POA: Diagnosis not present

## 2016-01-10 DIAGNOSIS — J449 Chronic obstructive pulmonary disease, unspecified: Secondary | ICD-10-CM | POA: Diagnosis not present

## 2016-01-11 DIAGNOSIS — J449 Chronic obstructive pulmonary disease, unspecified: Secondary | ICD-10-CM | POA: Diagnosis not present

## 2016-01-11 DIAGNOSIS — E034 Atrophy of thyroid (acquired): Secondary | ICD-10-CM | POA: Diagnosis not present

## 2016-01-11 DIAGNOSIS — G4733 Obstructive sleep apnea (adult) (pediatric): Secondary | ICD-10-CM | POA: Diagnosis not present

## 2016-01-12 DIAGNOSIS — E034 Atrophy of thyroid (acquired): Secondary | ICD-10-CM | POA: Diagnosis not present

## 2016-01-12 DIAGNOSIS — J449 Chronic obstructive pulmonary disease, unspecified: Secondary | ICD-10-CM | POA: Diagnosis not present

## 2016-01-12 DIAGNOSIS — G4733 Obstructive sleep apnea (adult) (pediatric): Secondary | ICD-10-CM | POA: Diagnosis not present

## 2016-01-12 DIAGNOSIS — R0902 Hypoxemia: Secondary | ICD-10-CM | POA: Diagnosis not present

## 2016-01-13 DIAGNOSIS — E034 Atrophy of thyroid (acquired): Secondary | ICD-10-CM | POA: Diagnosis not present

## 2016-01-13 DIAGNOSIS — J449 Chronic obstructive pulmonary disease, unspecified: Secondary | ICD-10-CM | POA: Diagnosis not present

## 2016-01-13 DIAGNOSIS — G4733 Obstructive sleep apnea (adult) (pediatric): Secondary | ICD-10-CM | POA: Diagnosis not present

## 2016-01-14 DIAGNOSIS — E034 Atrophy of thyroid (acquired): Secondary | ICD-10-CM | POA: Diagnosis not present

## 2016-01-14 DIAGNOSIS — G4733 Obstructive sleep apnea (adult) (pediatric): Secondary | ICD-10-CM | POA: Diagnosis not present

## 2016-01-14 DIAGNOSIS — J449 Chronic obstructive pulmonary disease, unspecified: Secondary | ICD-10-CM | POA: Diagnosis not present

## 2016-01-15 DIAGNOSIS — G4733 Obstructive sleep apnea (adult) (pediatric): Secondary | ICD-10-CM | POA: Diagnosis not present

## 2016-01-15 DIAGNOSIS — J449 Chronic obstructive pulmonary disease, unspecified: Secondary | ICD-10-CM | POA: Diagnosis not present

## 2016-01-15 DIAGNOSIS — E034 Atrophy of thyroid (acquired): Secondary | ICD-10-CM | POA: Diagnosis not present

## 2016-01-16 DIAGNOSIS — G4733 Obstructive sleep apnea (adult) (pediatric): Secondary | ICD-10-CM | POA: Diagnosis not present

## 2016-01-16 DIAGNOSIS — J449 Chronic obstructive pulmonary disease, unspecified: Secondary | ICD-10-CM | POA: Diagnosis not present

## 2016-01-16 DIAGNOSIS — E034 Atrophy of thyroid (acquired): Secondary | ICD-10-CM | POA: Diagnosis not present

## 2016-01-17 DIAGNOSIS — E034 Atrophy of thyroid (acquired): Secondary | ICD-10-CM | POA: Diagnosis not present

## 2016-01-17 DIAGNOSIS — G4733 Obstructive sleep apnea (adult) (pediatric): Secondary | ICD-10-CM | POA: Diagnosis not present

## 2016-01-17 DIAGNOSIS — J449 Chronic obstructive pulmonary disease, unspecified: Secondary | ICD-10-CM | POA: Diagnosis not present

## 2016-01-18 DIAGNOSIS — E034 Atrophy of thyroid (acquired): Secondary | ICD-10-CM | POA: Diagnosis not present

## 2016-01-18 DIAGNOSIS — J449 Chronic obstructive pulmonary disease, unspecified: Secondary | ICD-10-CM | POA: Diagnosis not present

## 2016-01-18 DIAGNOSIS — G4733 Obstructive sleep apnea (adult) (pediatric): Secondary | ICD-10-CM | POA: Diagnosis not present

## 2016-01-19 DIAGNOSIS — G4733 Obstructive sleep apnea (adult) (pediatric): Secondary | ICD-10-CM | POA: Diagnosis not present

## 2016-01-19 DIAGNOSIS — E034 Atrophy of thyroid (acquired): Secondary | ICD-10-CM | POA: Diagnosis not present

## 2016-01-19 DIAGNOSIS — J449 Chronic obstructive pulmonary disease, unspecified: Secondary | ICD-10-CM | POA: Diagnosis not present

## 2016-01-20 DIAGNOSIS — E034 Atrophy of thyroid (acquired): Secondary | ICD-10-CM | POA: Diagnosis not present

## 2016-01-20 DIAGNOSIS — G4733 Obstructive sleep apnea (adult) (pediatric): Secondary | ICD-10-CM | POA: Diagnosis not present

## 2016-01-20 DIAGNOSIS — J449 Chronic obstructive pulmonary disease, unspecified: Secondary | ICD-10-CM | POA: Diagnosis not present

## 2016-01-21 DIAGNOSIS — E034 Atrophy of thyroid (acquired): Secondary | ICD-10-CM | POA: Diagnosis not present

## 2016-01-21 DIAGNOSIS — G4733 Obstructive sleep apnea (adult) (pediatric): Secondary | ICD-10-CM | POA: Diagnosis not present

## 2016-01-21 DIAGNOSIS — J449 Chronic obstructive pulmonary disease, unspecified: Secondary | ICD-10-CM | POA: Diagnosis not present

## 2016-01-22 DIAGNOSIS — E034 Atrophy of thyroid (acquired): Secondary | ICD-10-CM | POA: Diagnosis not present

## 2016-01-22 DIAGNOSIS — J449 Chronic obstructive pulmonary disease, unspecified: Secondary | ICD-10-CM | POA: Diagnosis not present

## 2016-01-22 DIAGNOSIS — G4733 Obstructive sleep apnea (adult) (pediatric): Secondary | ICD-10-CM | POA: Diagnosis not present

## 2016-01-23 DIAGNOSIS — E034 Atrophy of thyroid (acquired): Secondary | ICD-10-CM | POA: Diagnosis not present

## 2016-01-23 DIAGNOSIS — J449 Chronic obstructive pulmonary disease, unspecified: Secondary | ICD-10-CM | POA: Diagnosis not present

## 2016-01-23 DIAGNOSIS — G4733 Obstructive sleep apnea (adult) (pediatric): Secondary | ICD-10-CM | POA: Diagnosis not present

## 2016-01-24 DIAGNOSIS — G4733 Obstructive sleep apnea (adult) (pediatric): Secondary | ICD-10-CM | POA: Diagnosis not present

## 2016-01-24 DIAGNOSIS — J449 Chronic obstructive pulmonary disease, unspecified: Secondary | ICD-10-CM | POA: Diagnosis not present

## 2016-01-24 DIAGNOSIS — E034 Atrophy of thyroid (acquired): Secondary | ICD-10-CM | POA: Diagnosis not present

## 2016-01-25 DIAGNOSIS — G4733 Obstructive sleep apnea (adult) (pediatric): Secondary | ICD-10-CM | POA: Diagnosis not present

## 2016-01-25 DIAGNOSIS — E034 Atrophy of thyroid (acquired): Secondary | ICD-10-CM | POA: Diagnosis not present

## 2016-01-25 DIAGNOSIS — J449 Chronic obstructive pulmonary disease, unspecified: Secondary | ICD-10-CM | POA: Diagnosis not present

## 2016-01-26 DIAGNOSIS — J449 Chronic obstructive pulmonary disease, unspecified: Secondary | ICD-10-CM | POA: Diagnosis not present

## 2016-01-26 DIAGNOSIS — G4733 Obstructive sleep apnea (adult) (pediatric): Secondary | ICD-10-CM | POA: Diagnosis not present

## 2016-01-26 DIAGNOSIS — E034 Atrophy of thyroid (acquired): Secondary | ICD-10-CM | POA: Diagnosis not present

## 2016-01-27 DIAGNOSIS — E034 Atrophy of thyroid (acquired): Secondary | ICD-10-CM | POA: Diagnosis not present

## 2016-01-27 DIAGNOSIS — G4733 Obstructive sleep apnea (adult) (pediatric): Secondary | ICD-10-CM | POA: Diagnosis not present

## 2016-01-27 DIAGNOSIS — J449 Chronic obstructive pulmonary disease, unspecified: Secondary | ICD-10-CM | POA: Diagnosis not present

## 2016-01-28 DIAGNOSIS — G4733 Obstructive sleep apnea (adult) (pediatric): Secondary | ICD-10-CM | POA: Diagnosis not present

## 2016-01-28 DIAGNOSIS — E034 Atrophy of thyroid (acquired): Secondary | ICD-10-CM | POA: Diagnosis not present

## 2016-01-28 DIAGNOSIS — J449 Chronic obstructive pulmonary disease, unspecified: Secondary | ICD-10-CM | POA: Diagnosis not present

## 2016-01-29 DIAGNOSIS — G4733 Obstructive sleep apnea (adult) (pediatric): Secondary | ICD-10-CM | POA: Diagnosis not present

## 2016-01-29 DIAGNOSIS — J449 Chronic obstructive pulmonary disease, unspecified: Secondary | ICD-10-CM | POA: Diagnosis not present

## 2016-01-29 DIAGNOSIS — E034 Atrophy of thyroid (acquired): Secondary | ICD-10-CM | POA: Diagnosis not present

## 2016-01-30 DIAGNOSIS — J449 Chronic obstructive pulmonary disease, unspecified: Secondary | ICD-10-CM | POA: Diagnosis not present

## 2016-01-30 DIAGNOSIS — G4733 Obstructive sleep apnea (adult) (pediatric): Secondary | ICD-10-CM | POA: Diagnosis not present

## 2016-01-30 DIAGNOSIS — E034 Atrophy of thyroid (acquired): Secondary | ICD-10-CM | POA: Diagnosis not present

## 2016-01-31 DIAGNOSIS — E034 Atrophy of thyroid (acquired): Secondary | ICD-10-CM | POA: Diagnosis not present

## 2016-01-31 DIAGNOSIS — J449 Chronic obstructive pulmonary disease, unspecified: Secondary | ICD-10-CM | POA: Diagnosis not present

## 2016-01-31 DIAGNOSIS — G4733 Obstructive sleep apnea (adult) (pediatric): Secondary | ICD-10-CM | POA: Diagnosis not present

## 2016-02-01 DIAGNOSIS — G4733 Obstructive sleep apnea (adult) (pediatric): Secondary | ICD-10-CM | POA: Diagnosis not present

## 2016-02-01 DIAGNOSIS — J449 Chronic obstructive pulmonary disease, unspecified: Secondary | ICD-10-CM | POA: Diagnosis not present

## 2016-02-01 DIAGNOSIS — E034 Atrophy of thyroid (acquired): Secondary | ICD-10-CM | POA: Diagnosis not present

## 2016-02-02 DIAGNOSIS — G4733 Obstructive sleep apnea (adult) (pediatric): Secondary | ICD-10-CM | POA: Diagnosis not present

## 2016-02-02 DIAGNOSIS — J449 Chronic obstructive pulmonary disease, unspecified: Secondary | ICD-10-CM | POA: Diagnosis not present

## 2016-02-02 DIAGNOSIS — E034 Atrophy of thyroid (acquired): Secondary | ICD-10-CM | POA: Diagnosis not present

## 2016-02-03 DIAGNOSIS — J449 Chronic obstructive pulmonary disease, unspecified: Secondary | ICD-10-CM | POA: Diagnosis not present

## 2016-02-03 DIAGNOSIS — E034 Atrophy of thyroid (acquired): Secondary | ICD-10-CM | POA: Diagnosis not present

## 2016-02-03 DIAGNOSIS — G4733 Obstructive sleep apnea (adult) (pediatric): Secondary | ICD-10-CM | POA: Diagnosis not present

## 2016-02-04 DIAGNOSIS — J449 Chronic obstructive pulmonary disease, unspecified: Secondary | ICD-10-CM | POA: Diagnosis not present

## 2016-02-04 DIAGNOSIS — G4733 Obstructive sleep apnea (adult) (pediatric): Secondary | ICD-10-CM | POA: Diagnosis not present

## 2016-02-04 DIAGNOSIS — E034 Atrophy of thyroid (acquired): Secondary | ICD-10-CM | POA: Diagnosis not present

## 2016-02-05 DIAGNOSIS — J449 Chronic obstructive pulmonary disease, unspecified: Secondary | ICD-10-CM | POA: Diagnosis not present

## 2016-02-05 DIAGNOSIS — E034 Atrophy of thyroid (acquired): Secondary | ICD-10-CM | POA: Diagnosis not present

## 2016-02-05 DIAGNOSIS — G4733 Obstructive sleep apnea (adult) (pediatric): Secondary | ICD-10-CM | POA: Diagnosis not present

## 2016-02-11 DIAGNOSIS — R0902 Hypoxemia: Secondary | ICD-10-CM | POA: Diagnosis not present

## 2016-02-22 DIAGNOSIS — J449 Chronic obstructive pulmonary disease, unspecified: Secondary | ICD-10-CM | POA: Diagnosis not present

## 2016-02-22 DIAGNOSIS — I482 Chronic atrial fibrillation: Secondary | ICD-10-CM | POA: Diagnosis not present

## 2016-02-22 DIAGNOSIS — E034 Atrophy of thyroid (acquired): Secondary | ICD-10-CM | POA: Diagnosis not present

## 2016-02-22 DIAGNOSIS — G4733 Obstructive sleep apnea (adult) (pediatric): Secondary | ICD-10-CM | POA: Diagnosis not present

## 2016-02-23 DIAGNOSIS — E034 Atrophy of thyroid (acquired): Secondary | ICD-10-CM | POA: Diagnosis not present

## 2016-02-23 DIAGNOSIS — G4733 Obstructive sleep apnea (adult) (pediatric): Secondary | ICD-10-CM | POA: Diagnosis not present

## 2016-02-23 DIAGNOSIS — I482 Chronic atrial fibrillation: Secondary | ICD-10-CM | POA: Diagnosis not present

## 2016-02-23 DIAGNOSIS — J449 Chronic obstructive pulmonary disease, unspecified: Secondary | ICD-10-CM | POA: Diagnosis not present

## 2016-02-23 DIAGNOSIS — R32 Unspecified urinary incontinence: Secondary | ICD-10-CM | POA: Diagnosis not present

## 2016-02-24 DIAGNOSIS — J449 Chronic obstructive pulmonary disease, unspecified: Secondary | ICD-10-CM | POA: Diagnosis not present

## 2016-02-24 DIAGNOSIS — E034 Atrophy of thyroid (acquired): Secondary | ICD-10-CM | POA: Diagnosis not present

## 2016-02-24 DIAGNOSIS — G4733 Obstructive sleep apnea (adult) (pediatric): Secondary | ICD-10-CM | POA: Diagnosis not present

## 2016-02-24 DIAGNOSIS — I482 Chronic atrial fibrillation: Secondary | ICD-10-CM | POA: Diagnosis not present

## 2016-02-25 DIAGNOSIS — E034 Atrophy of thyroid (acquired): Secondary | ICD-10-CM | POA: Diagnosis not present

## 2016-02-25 DIAGNOSIS — G4733 Obstructive sleep apnea (adult) (pediatric): Secondary | ICD-10-CM | POA: Diagnosis not present

## 2016-02-25 DIAGNOSIS — J449 Chronic obstructive pulmonary disease, unspecified: Secondary | ICD-10-CM | POA: Diagnosis not present

## 2016-02-25 DIAGNOSIS — I482 Chronic atrial fibrillation: Secondary | ICD-10-CM | POA: Diagnosis not present

## 2016-02-26 DIAGNOSIS — G4733 Obstructive sleep apnea (adult) (pediatric): Secondary | ICD-10-CM | POA: Diagnosis not present

## 2016-02-26 DIAGNOSIS — E034 Atrophy of thyroid (acquired): Secondary | ICD-10-CM | POA: Diagnosis not present

## 2016-02-26 DIAGNOSIS — I482 Chronic atrial fibrillation: Secondary | ICD-10-CM | POA: Diagnosis not present

## 2016-02-26 DIAGNOSIS — J449 Chronic obstructive pulmonary disease, unspecified: Secondary | ICD-10-CM | POA: Diagnosis not present

## 2016-02-27 DIAGNOSIS — J449 Chronic obstructive pulmonary disease, unspecified: Secondary | ICD-10-CM | POA: Diagnosis not present

## 2016-02-27 DIAGNOSIS — E034 Atrophy of thyroid (acquired): Secondary | ICD-10-CM | POA: Diagnosis not present

## 2016-02-27 DIAGNOSIS — I482 Chronic atrial fibrillation: Secondary | ICD-10-CM | POA: Diagnosis not present

## 2016-02-27 DIAGNOSIS — G4733 Obstructive sleep apnea (adult) (pediatric): Secondary | ICD-10-CM | POA: Diagnosis not present

## 2016-02-28 DIAGNOSIS — E034 Atrophy of thyroid (acquired): Secondary | ICD-10-CM | POA: Diagnosis not present

## 2016-02-28 DIAGNOSIS — J449 Chronic obstructive pulmonary disease, unspecified: Secondary | ICD-10-CM | POA: Diagnosis not present

## 2016-02-28 DIAGNOSIS — I482 Chronic atrial fibrillation: Secondary | ICD-10-CM | POA: Diagnosis not present

## 2016-02-28 DIAGNOSIS — G4733 Obstructive sleep apnea (adult) (pediatric): Secondary | ICD-10-CM | POA: Diagnosis not present

## 2016-02-29 DIAGNOSIS — G4733 Obstructive sleep apnea (adult) (pediatric): Secondary | ICD-10-CM | POA: Diagnosis not present

## 2016-02-29 DIAGNOSIS — I482 Chronic atrial fibrillation: Secondary | ICD-10-CM | POA: Diagnosis not present

## 2016-02-29 DIAGNOSIS — J449 Chronic obstructive pulmonary disease, unspecified: Secondary | ICD-10-CM | POA: Diagnosis not present

## 2016-02-29 DIAGNOSIS — E034 Atrophy of thyroid (acquired): Secondary | ICD-10-CM | POA: Diagnosis not present

## 2016-03-01 DIAGNOSIS — G4733 Obstructive sleep apnea (adult) (pediatric): Secondary | ICD-10-CM | POA: Diagnosis not present

## 2016-03-01 DIAGNOSIS — E034 Atrophy of thyroid (acquired): Secondary | ICD-10-CM | POA: Diagnosis not present

## 2016-03-01 DIAGNOSIS — J449 Chronic obstructive pulmonary disease, unspecified: Secondary | ICD-10-CM | POA: Diagnosis not present

## 2016-03-01 DIAGNOSIS — I482 Chronic atrial fibrillation: Secondary | ICD-10-CM | POA: Diagnosis not present

## 2016-03-02 DIAGNOSIS — E034 Atrophy of thyroid (acquired): Secondary | ICD-10-CM | POA: Diagnosis not present

## 2016-03-02 DIAGNOSIS — J449 Chronic obstructive pulmonary disease, unspecified: Secondary | ICD-10-CM | POA: Diagnosis not present

## 2016-03-02 DIAGNOSIS — I482 Chronic atrial fibrillation: Secondary | ICD-10-CM | POA: Diagnosis not present

## 2016-03-02 DIAGNOSIS — G4733 Obstructive sleep apnea (adult) (pediatric): Secondary | ICD-10-CM | POA: Diagnosis not present

## 2016-03-03 DIAGNOSIS — I482 Chronic atrial fibrillation: Secondary | ICD-10-CM | POA: Diagnosis not present

## 2016-03-03 DIAGNOSIS — E034 Atrophy of thyroid (acquired): Secondary | ICD-10-CM | POA: Diagnosis not present

## 2016-03-03 DIAGNOSIS — J449 Chronic obstructive pulmonary disease, unspecified: Secondary | ICD-10-CM | POA: Diagnosis not present

## 2016-03-03 DIAGNOSIS — G4733 Obstructive sleep apnea (adult) (pediatric): Secondary | ICD-10-CM | POA: Diagnosis not present

## 2016-03-04 DIAGNOSIS — G4733 Obstructive sleep apnea (adult) (pediatric): Secondary | ICD-10-CM | POA: Diagnosis not present

## 2016-03-04 DIAGNOSIS — J449 Chronic obstructive pulmonary disease, unspecified: Secondary | ICD-10-CM | POA: Diagnosis not present

## 2016-03-04 DIAGNOSIS — E034 Atrophy of thyroid (acquired): Secondary | ICD-10-CM | POA: Diagnosis not present

## 2016-03-04 DIAGNOSIS — I482 Chronic atrial fibrillation: Secondary | ICD-10-CM | POA: Diagnosis not present

## 2016-03-05 DIAGNOSIS — G4733 Obstructive sleep apnea (adult) (pediatric): Secondary | ICD-10-CM | POA: Diagnosis not present

## 2016-03-05 DIAGNOSIS — E034 Atrophy of thyroid (acquired): Secondary | ICD-10-CM | POA: Diagnosis not present

## 2016-03-05 DIAGNOSIS — I482 Chronic atrial fibrillation: Secondary | ICD-10-CM | POA: Diagnosis not present

## 2016-03-05 DIAGNOSIS — J449 Chronic obstructive pulmonary disease, unspecified: Secondary | ICD-10-CM | POA: Diagnosis not present

## 2016-03-06 DIAGNOSIS — I482 Chronic atrial fibrillation: Secondary | ICD-10-CM | POA: Diagnosis not present

## 2016-03-06 DIAGNOSIS — E034 Atrophy of thyroid (acquired): Secondary | ICD-10-CM | POA: Diagnosis not present

## 2016-03-06 DIAGNOSIS — G4733 Obstructive sleep apnea (adult) (pediatric): Secondary | ICD-10-CM | POA: Diagnosis not present

## 2016-03-06 DIAGNOSIS — J449 Chronic obstructive pulmonary disease, unspecified: Secondary | ICD-10-CM | POA: Diagnosis not present

## 2016-03-07 ENCOUNTER — Other Ambulatory Visit: Payer: Self-pay | Admitting: Oncology

## 2016-03-07 DIAGNOSIS — J449 Chronic obstructive pulmonary disease, unspecified: Secondary | ICD-10-CM | POA: Diagnosis not present

## 2016-03-07 DIAGNOSIS — G4733 Obstructive sleep apnea (adult) (pediatric): Secondary | ICD-10-CM | POA: Diagnosis not present

## 2016-03-07 DIAGNOSIS — I482 Chronic atrial fibrillation: Secondary | ICD-10-CM | POA: Diagnosis not present

## 2016-03-07 DIAGNOSIS — E034 Atrophy of thyroid (acquired): Secondary | ICD-10-CM | POA: Diagnosis not present

## 2016-03-08 DIAGNOSIS — E034 Atrophy of thyroid (acquired): Secondary | ICD-10-CM | POA: Diagnosis not present

## 2016-03-08 DIAGNOSIS — J449 Chronic obstructive pulmonary disease, unspecified: Secondary | ICD-10-CM | POA: Diagnosis not present

## 2016-03-08 DIAGNOSIS — G4733 Obstructive sleep apnea (adult) (pediatric): Secondary | ICD-10-CM | POA: Diagnosis not present

## 2016-03-08 DIAGNOSIS — I482 Chronic atrial fibrillation: Secondary | ICD-10-CM | POA: Diagnosis not present

## 2016-03-09 DIAGNOSIS — I482 Chronic atrial fibrillation: Secondary | ICD-10-CM | POA: Diagnosis not present

## 2016-03-09 DIAGNOSIS — J449 Chronic obstructive pulmonary disease, unspecified: Secondary | ICD-10-CM | POA: Diagnosis not present

## 2016-03-09 DIAGNOSIS — E034 Atrophy of thyroid (acquired): Secondary | ICD-10-CM | POA: Diagnosis not present

## 2016-03-09 DIAGNOSIS — G4733 Obstructive sleep apnea (adult) (pediatric): Secondary | ICD-10-CM | POA: Diagnosis not present

## 2016-03-10 DIAGNOSIS — I482 Chronic atrial fibrillation: Secondary | ICD-10-CM | POA: Diagnosis not present

## 2016-03-10 DIAGNOSIS — G4733 Obstructive sleep apnea (adult) (pediatric): Secondary | ICD-10-CM | POA: Diagnosis not present

## 2016-03-10 DIAGNOSIS — J449 Chronic obstructive pulmonary disease, unspecified: Secondary | ICD-10-CM | POA: Diagnosis not present

## 2016-03-10 DIAGNOSIS — E034 Atrophy of thyroid (acquired): Secondary | ICD-10-CM | POA: Diagnosis not present

## 2016-03-11 DIAGNOSIS — I482 Chronic atrial fibrillation: Secondary | ICD-10-CM | POA: Diagnosis not present

## 2016-03-11 DIAGNOSIS — E034 Atrophy of thyroid (acquired): Secondary | ICD-10-CM | POA: Diagnosis not present

## 2016-03-11 DIAGNOSIS — J449 Chronic obstructive pulmonary disease, unspecified: Secondary | ICD-10-CM | POA: Diagnosis not present

## 2016-03-11 DIAGNOSIS — G4733 Obstructive sleep apnea (adult) (pediatric): Secondary | ICD-10-CM | POA: Diagnosis not present

## 2016-03-12 DIAGNOSIS — E034 Atrophy of thyroid (acquired): Secondary | ICD-10-CM | POA: Diagnosis not present

## 2016-03-12 DIAGNOSIS — I482 Chronic atrial fibrillation: Secondary | ICD-10-CM | POA: Diagnosis not present

## 2016-03-12 DIAGNOSIS — J449 Chronic obstructive pulmonary disease, unspecified: Secondary | ICD-10-CM | POA: Diagnosis not present

## 2016-03-12 DIAGNOSIS — G4733 Obstructive sleep apnea (adult) (pediatric): Secondary | ICD-10-CM | POA: Diagnosis not present

## 2016-03-13 DIAGNOSIS — R0902 Hypoxemia: Secondary | ICD-10-CM | POA: Diagnosis not present

## 2016-03-13 DIAGNOSIS — J449 Chronic obstructive pulmonary disease, unspecified: Secondary | ICD-10-CM | POA: Diagnosis not present

## 2016-03-13 DIAGNOSIS — I482 Chronic atrial fibrillation: Secondary | ICD-10-CM | POA: Diagnosis not present

## 2016-03-13 DIAGNOSIS — E034 Atrophy of thyroid (acquired): Secondary | ICD-10-CM | POA: Diagnosis not present

## 2016-03-13 DIAGNOSIS — G4733 Obstructive sleep apnea (adult) (pediatric): Secondary | ICD-10-CM | POA: Diagnosis not present

## 2016-03-14 DIAGNOSIS — G4733 Obstructive sleep apnea (adult) (pediatric): Secondary | ICD-10-CM | POA: Diagnosis not present

## 2016-03-14 DIAGNOSIS — I482 Chronic atrial fibrillation: Secondary | ICD-10-CM | POA: Diagnosis not present

## 2016-03-14 DIAGNOSIS — H353133 Nonexudative age-related macular degeneration, bilateral, advanced atrophic without subfoveal involvement: Secondary | ICD-10-CM | POA: Diagnosis not present

## 2016-03-14 DIAGNOSIS — Z961 Presence of intraocular lens: Secondary | ICD-10-CM | POA: Diagnosis not present

## 2016-03-14 DIAGNOSIS — J449 Chronic obstructive pulmonary disease, unspecified: Secondary | ICD-10-CM | POA: Diagnosis not present

## 2016-03-14 DIAGNOSIS — H524 Presbyopia: Secondary | ICD-10-CM | POA: Diagnosis not present

## 2016-03-14 DIAGNOSIS — H26491 Other secondary cataract, right eye: Secondary | ICD-10-CM | POA: Diagnosis not present

## 2016-03-14 DIAGNOSIS — H52223 Regular astigmatism, bilateral: Secondary | ICD-10-CM | POA: Diagnosis not present

## 2016-03-14 DIAGNOSIS — H5203 Hypermetropia, bilateral: Secondary | ICD-10-CM | POA: Diagnosis not present

## 2016-03-14 DIAGNOSIS — Z9842 Cataract extraction status, left eye: Secondary | ICD-10-CM | POA: Diagnosis not present

## 2016-03-14 DIAGNOSIS — H35342 Macular cyst, hole, or pseudohole, left eye: Secondary | ICD-10-CM | POA: Diagnosis not present

## 2016-03-14 DIAGNOSIS — E034 Atrophy of thyroid (acquired): Secondary | ICD-10-CM | POA: Diagnosis not present

## 2016-03-14 DIAGNOSIS — H35341 Macular cyst, hole, or pseudohole, right eye: Secondary | ICD-10-CM | POA: Diagnosis not present

## 2016-03-15 DIAGNOSIS — G4733 Obstructive sleep apnea (adult) (pediatric): Secondary | ICD-10-CM | POA: Diagnosis not present

## 2016-03-15 DIAGNOSIS — I482 Chronic atrial fibrillation: Secondary | ICD-10-CM | POA: Diagnosis not present

## 2016-03-15 DIAGNOSIS — J449 Chronic obstructive pulmonary disease, unspecified: Secondary | ICD-10-CM | POA: Diagnosis not present

## 2016-03-15 DIAGNOSIS — E034 Atrophy of thyroid (acquired): Secondary | ICD-10-CM | POA: Diagnosis not present

## 2016-03-16 DIAGNOSIS — G4733 Obstructive sleep apnea (adult) (pediatric): Secondary | ICD-10-CM | POA: Diagnosis not present

## 2016-03-16 DIAGNOSIS — I482 Chronic atrial fibrillation: Secondary | ICD-10-CM | POA: Diagnosis not present

## 2016-03-16 DIAGNOSIS — J449 Chronic obstructive pulmonary disease, unspecified: Secondary | ICD-10-CM | POA: Diagnosis not present

## 2016-03-16 DIAGNOSIS — E034 Atrophy of thyroid (acquired): Secondary | ICD-10-CM | POA: Diagnosis not present

## 2016-03-17 DIAGNOSIS — J449 Chronic obstructive pulmonary disease, unspecified: Secondary | ICD-10-CM | POA: Diagnosis not present

## 2016-03-17 DIAGNOSIS — G4733 Obstructive sleep apnea (adult) (pediatric): Secondary | ICD-10-CM | POA: Diagnosis not present

## 2016-03-17 DIAGNOSIS — E034 Atrophy of thyroid (acquired): Secondary | ICD-10-CM | POA: Diagnosis not present

## 2016-03-17 DIAGNOSIS — I482 Chronic atrial fibrillation: Secondary | ICD-10-CM | POA: Diagnosis not present

## 2016-03-18 DIAGNOSIS — E034 Atrophy of thyroid (acquired): Secondary | ICD-10-CM | POA: Diagnosis not present

## 2016-03-18 DIAGNOSIS — J449 Chronic obstructive pulmonary disease, unspecified: Secondary | ICD-10-CM | POA: Diagnosis not present

## 2016-03-18 DIAGNOSIS — I482 Chronic atrial fibrillation: Secondary | ICD-10-CM | POA: Diagnosis not present

## 2016-03-18 DIAGNOSIS — G4733 Obstructive sleep apnea (adult) (pediatric): Secondary | ICD-10-CM | POA: Diagnosis not present

## 2016-03-19 DIAGNOSIS — E034 Atrophy of thyroid (acquired): Secondary | ICD-10-CM | POA: Diagnosis not present

## 2016-03-19 DIAGNOSIS — J449 Chronic obstructive pulmonary disease, unspecified: Secondary | ICD-10-CM | POA: Diagnosis not present

## 2016-03-19 DIAGNOSIS — G4733 Obstructive sleep apnea (adult) (pediatric): Secondary | ICD-10-CM | POA: Diagnosis not present

## 2016-03-19 DIAGNOSIS — I482 Chronic atrial fibrillation: Secondary | ICD-10-CM | POA: Diagnosis not present

## 2016-03-20 DIAGNOSIS — E034 Atrophy of thyroid (acquired): Secondary | ICD-10-CM | POA: Diagnosis not present

## 2016-03-20 DIAGNOSIS — I482 Chronic atrial fibrillation: Secondary | ICD-10-CM | POA: Diagnosis not present

## 2016-03-20 DIAGNOSIS — J449 Chronic obstructive pulmonary disease, unspecified: Secondary | ICD-10-CM | POA: Diagnosis not present

## 2016-03-20 DIAGNOSIS — G4733 Obstructive sleep apnea (adult) (pediatric): Secondary | ICD-10-CM | POA: Diagnosis not present

## 2016-03-21 DIAGNOSIS — J449 Chronic obstructive pulmonary disease, unspecified: Secondary | ICD-10-CM | POA: Diagnosis not present

## 2016-03-21 DIAGNOSIS — G4733 Obstructive sleep apnea (adult) (pediatric): Secondary | ICD-10-CM | POA: Diagnosis not present

## 2016-03-21 DIAGNOSIS — E034 Atrophy of thyroid (acquired): Secondary | ICD-10-CM | POA: Diagnosis not present

## 2016-03-21 DIAGNOSIS — I482 Chronic atrial fibrillation: Secondary | ICD-10-CM | POA: Diagnosis not present

## 2016-03-22 ENCOUNTER — Ambulatory Visit: Payer: Medicare PPO | Admitting: Radiation Oncology

## 2016-03-22 ENCOUNTER — Ambulatory Visit: Admission: RE | Admit: 2016-03-22 | Payer: Medicare PPO | Source: Ambulatory Visit | Admitting: Radiation Oncology

## 2016-03-22 DIAGNOSIS — E034 Atrophy of thyroid (acquired): Secondary | ICD-10-CM | POA: Diagnosis not present

## 2016-03-22 DIAGNOSIS — G4733 Obstructive sleep apnea (adult) (pediatric): Secondary | ICD-10-CM | POA: Diagnosis not present

## 2016-03-22 DIAGNOSIS — I482 Chronic atrial fibrillation: Secondary | ICD-10-CM | POA: Diagnosis not present

## 2016-03-22 DIAGNOSIS — J449 Chronic obstructive pulmonary disease, unspecified: Secondary | ICD-10-CM | POA: Diagnosis not present

## 2016-03-23 DIAGNOSIS — J449 Chronic obstructive pulmonary disease, unspecified: Secondary | ICD-10-CM | POA: Diagnosis not present

## 2016-03-23 DIAGNOSIS — I482 Chronic atrial fibrillation: Secondary | ICD-10-CM | POA: Diagnosis not present

## 2016-03-23 DIAGNOSIS — E034 Atrophy of thyroid (acquired): Secondary | ICD-10-CM | POA: Diagnosis not present

## 2016-03-24 DIAGNOSIS — I482 Chronic atrial fibrillation: Secondary | ICD-10-CM | POA: Diagnosis not present

## 2016-03-24 DIAGNOSIS — E034 Atrophy of thyroid (acquired): Secondary | ICD-10-CM | POA: Diagnosis not present

## 2016-03-24 DIAGNOSIS — J449 Chronic obstructive pulmonary disease, unspecified: Secondary | ICD-10-CM | POA: Diagnosis not present

## 2016-03-25 DIAGNOSIS — J449 Chronic obstructive pulmonary disease, unspecified: Secondary | ICD-10-CM | POA: Diagnosis not present

## 2016-03-25 DIAGNOSIS — I482 Chronic atrial fibrillation: Secondary | ICD-10-CM | POA: Diagnosis not present

## 2016-03-25 DIAGNOSIS — E034 Atrophy of thyroid (acquired): Secondary | ICD-10-CM | POA: Diagnosis not present

## 2016-03-25 DIAGNOSIS — R32 Unspecified urinary incontinence: Secondary | ICD-10-CM | POA: Diagnosis not present

## 2016-03-26 DIAGNOSIS — E034 Atrophy of thyroid (acquired): Secondary | ICD-10-CM | POA: Diagnosis not present

## 2016-03-26 DIAGNOSIS — I482 Chronic atrial fibrillation: Secondary | ICD-10-CM | POA: Diagnosis not present

## 2016-03-26 DIAGNOSIS — J449 Chronic obstructive pulmonary disease, unspecified: Secondary | ICD-10-CM | POA: Diagnosis not present

## 2016-03-27 DIAGNOSIS — J449 Chronic obstructive pulmonary disease, unspecified: Secondary | ICD-10-CM | POA: Diagnosis not present

## 2016-03-27 DIAGNOSIS — I482 Chronic atrial fibrillation: Secondary | ICD-10-CM | POA: Diagnosis not present

## 2016-03-27 DIAGNOSIS — E034 Atrophy of thyroid (acquired): Secondary | ICD-10-CM | POA: Diagnosis not present

## 2016-03-28 DIAGNOSIS — J449 Chronic obstructive pulmonary disease, unspecified: Secondary | ICD-10-CM | POA: Diagnosis not present

## 2016-03-28 DIAGNOSIS — I482 Chronic atrial fibrillation: Secondary | ICD-10-CM | POA: Diagnosis not present

## 2016-03-28 DIAGNOSIS — E034 Atrophy of thyroid (acquired): Secondary | ICD-10-CM | POA: Diagnosis not present

## 2016-03-29 DIAGNOSIS — E034 Atrophy of thyroid (acquired): Secondary | ICD-10-CM | POA: Diagnosis not present

## 2016-03-29 DIAGNOSIS — J449 Chronic obstructive pulmonary disease, unspecified: Secondary | ICD-10-CM | POA: Diagnosis not present

## 2016-03-29 DIAGNOSIS — I482 Chronic atrial fibrillation: Secondary | ICD-10-CM | POA: Diagnosis not present

## 2016-03-30 DIAGNOSIS — I482 Chronic atrial fibrillation: Secondary | ICD-10-CM | POA: Diagnosis not present

## 2016-03-30 DIAGNOSIS — E034 Atrophy of thyroid (acquired): Secondary | ICD-10-CM | POA: Diagnosis not present

## 2016-03-30 DIAGNOSIS — J449 Chronic obstructive pulmonary disease, unspecified: Secondary | ICD-10-CM | POA: Diagnosis not present

## 2016-03-31 DIAGNOSIS — E034 Atrophy of thyroid (acquired): Secondary | ICD-10-CM | POA: Diagnosis not present

## 2016-03-31 DIAGNOSIS — J449 Chronic obstructive pulmonary disease, unspecified: Secondary | ICD-10-CM | POA: Diagnosis not present

## 2016-03-31 DIAGNOSIS — I482 Chronic atrial fibrillation: Secondary | ICD-10-CM | POA: Diagnosis not present

## 2016-04-01 DIAGNOSIS — I482 Chronic atrial fibrillation: Secondary | ICD-10-CM | POA: Diagnosis not present

## 2016-04-01 DIAGNOSIS — E034 Atrophy of thyroid (acquired): Secondary | ICD-10-CM | POA: Diagnosis not present

## 2016-04-01 DIAGNOSIS — J449 Chronic obstructive pulmonary disease, unspecified: Secondary | ICD-10-CM | POA: Diagnosis not present

## 2016-04-02 DIAGNOSIS — E034 Atrophy of thyroid (acquired): Secondary | ICD-10-CM | POA: Diagnosis not present

## 2016-04-02 DIAGNOSIS — I482 Chronic atrial fibrillation: Secondary | ICD-10-CM | POA: Diagnosis not present

## 2016-04-02 DIAGNOSIS — J449 Chronic obstructive pulmonary disease, unspecified: Secondary | ICD-10-CM | POA: Diagnosis not present

## 2016-04-03 DIAGNOSIS — I482 Chronic atrial fibrillation: Secondary | ICD-10-CM | POA: Diagnosis not present

## 2016-04-03 DIAGNOSIS — E034 Atrophy of thyroid (acquired): Secondary | ICD-10-CM | POA: Diagnosis not present

## 2016-04-03 DIAGNOSIS — J449 Chronic obstructive pulmonary disease, unspecified: Secondary | ICD-10-CM | POA: Diagnosis not present

## 2016-04-04 DIAGNOSIS — J449 Chronic obstructive pulmonary disease, unspecified: Secondary | ICD-10-CM | POA: Diagnosis not present

## 2016-04-04 DIAGNOSIS — I482 Chronic atrial fibrillation: Secondary | ICD-10-CM | POA: Diagnosis not present

## 2016-04-04 DIAGNOSIS — E034 Atrophy of thyroid (acquired): Secondary | ICD-10-CM | POA: Diagnosis not present

## 2016-04-05 DIAGNOSIS — I482 Chronic atrial fibrillation: Secondary | ICD-10-CM | POA: Diagnosis not present

## 2016-04-05 DIAGNOSIS — J449 Chronic obstructive pulmonary disease, unspecified: Secondary | ICD-10-CM | POA: Diagnosis not present

## 2016-04-05 DIAGNOSIS — E034 Atrophy of thyroid (acquired): Secondary | ICD-10-CM | POA: Diagnosis not present

## 2016-04-06 DIAGNOSIS — I482 Chronic atrial fibrillation: Secondary | ICD-10-CM | POA: Diagnosis not present

## 2016-04-06 DIAGNOSIS — J449 Chronic obstructive pulmonary disease, unspecified: Secondary | ICD-10-CM | POA: Diagnosis not present

## 2016-04-06 DIAGNOSIS — E034 Atrophy of thyroid (acquired): Secondary | ICD-10-CM | POA: Diagnosis not present

## 2016-04-07 DIAGNOSIS — I482 Chronic atrial fibrillation: Secondary | ICD-10-CM | POA: Diagnosis not present

## 2016-04-07 DIAGNOSIS — J449 Chronic obstructive pulmonary disease, unspecified: Secondary | ICD-10-CM | POA: Diagnosis not present

## 2016-04-07 DIAGNOSIS — E034 Atrophy of thyroid (acquired): Secondary | ICD-10-CM | POA: Diagnosis not present

## 2016-04-08 DIAGNOSIS — I482 Chronic atrial fibrillation: Secondary | ICD-10-CM | POA: Diagnosis not present

## 2016-04-08 DIAGNOSIS — E034 Atrophy of thyroid (acquired): Secondary | ICD-10-CM | POA: Diagnosis not present

## 2016-04-08 DIAGNOSIS — J449 Chronic obstructive pulmonary disease, unspecified: Secondary | ICD-10-CM | POA: Diagnosis not present

## 2016-04-09 ENCOUNTER — Inpatient Hospital Stay: Payer: Commercial Managed Care - HMO | Attending: Oncology | Admitting: Oncology

## 2016-04-09 ENCOUNTER — Ambulatory Visit
Admission: RE | Admit: 2016-04-09 | Discharge: 2016-04-09 | Disposition: A | Discharge status: Home / Self Care | Payer: Commercial Managed Care - HMO | Source: Ambulatory Visit | Attending: Radiation Oncology | Admitting: Radiation Oncology

## 2016-04-09 ENCOUNTER — Encounter: Payer: Self-pay | Admitting: Radiation Oncology

## 2016-04-09 ENCOUNTER — Inpatient Hospital Stay: Payer: Commercial Managed Care - HMO

## 2016-04-09 VITALS — BP 125/75 | HR 79 | Resp 18 | Wt 183.2 lb

## 2016-04-09 VITALS — BP 115/61 | HR 85 | Temp 97.3°F | Wt 183.0 lb

## 2016-04-09 DIAGNOSIS — Z79899 Other long term (current) drug therapy: Secondary | ICD-10-CM | POA: Insufficient documentation

## 2016-04-09 DIAGNOSIS — Z923 Personal history of irradiation: Secondary | ICD-10-CM | POA: Insufficient documentation

## 2016-04-09 DIAGNOSIS — C73 Malignant neoplasm of thyroid gland: Secondary | ICD-10-CM

## 2016-04-09 DIAGNOSIS — E890 Postprocedural hypothyroidism: Secondary | ICD-10-CM | POA: Insufficient documentation

## 2016-04-09 DIAGNOSIS — R918 Other nonspecific abnormal finding of lung field: Secondary | ICD-10-CM | POA: Diagnosis not present

## 2016-04-09 DIAGNOSIS — Z7982 Long term (current) use of aspirin: Secondary | ICD-10-CM | POA: Insufficient documentation

## 2016-04-09 DIAGNOSIS — I482 Chronic atrial fibrillation: Secondary | ICD-10-CM | POA: Diagnosis not present

## 2016-04-09 DIAGNOSIS — C7800 Secondary malignant neoplasm of unspecified lung: Secondary | ICD-10-CM | POA: Diagnosis not present

## 2016-04-09 DIAGNOSIS — J449 Chronic obstructive pulmonary disease, unspecified: Secondary | ICD-10-CM | POA: Diagnosis not present

## 2016-04-09 DIAGNOSIS — R490 Dysphonia: Secondary | ICD-10-CM | POA: Diagnosis not present

## 2016-04-09 DIAGNOSIS — D649 Anemia, unspecified: Secondary | ICD-10-CM | POA: Diagnosis not present

## 2016-04-09 DIAGNOSIS — E034 Atrophy of thyroid (acquired): Secondary | ICD-10-CM | POA: Diagnosis not present

## 2016-04-09 DIAGNOSIS — N390 Urinary tract infection, site not specified: Secondary | ICD-10-CM | POA: Diagnosis not present

## 2016-04-09 LAB — COMPREHENSIVE METABOLIC PANEL
ALBUMIN: 4.1 g/dL (ref 3.5–5.0)
ALT: 18 U/L (ref 14–54)
ANION GAP: 7 (ref 5–15)
AST: 24 U/L (ref 15–41)
Alkaline Phosphatase: 64 U/L (ref 38–126)
BUN: 31 mg/dL — ABNORMAL HIGH (ref 6–20)
CHLORIDE: 94 mmol/L — AB (ref 101–111)
CO2: 32 mmol/L (ref 22–32)
CREATININE: 0.94 mg/dL (ref 0.44–1.00)
Calcium: 8.2 mg/dL — ABNORMAL LOW (ref 8.9–10.3)
GFR calc non Af Amer: 55 mL/min — ABNORMAL LOW (ref >60)
GLUCOSE: 94 mg/dL (ref 65–99)
Potassium: 4.3 mmol/L (ref 3.5–5.1)
SODIUM: 133 mmol/L — AB (ref 135–145)
Total Bilirubin: 0.6 mg/dL (ref 0.3–1.2)
Total Protein: 7.9 g/dL (ref 6.5–8.1)

## 2016-04-09 LAB — TSH: TSH: 1.373 u[IU]/mL (ref 0.350–4.500)

## 2016-04-09 LAB — CBC WITH DIFFERENTIAL/PLATELET
BASOS PCT: 1 %
Basophils Absolute: 0.1 10*3/uL (ref 0–0.1)
EOS ABS: 0.3 10*3/uL (ref 0–0.7)
EOS PCT: 4 %
HCT: 32 % — ABNORMAL LOW (ref 35.0–47.0)
HEMOGLOBIN: 10.7 g/dL — AB (ref 12.0–16.0)
LYMPHS ABS: 1.3 10*3/uL (ref 1.0–3.6)
Lymphocytes Relative: 18 %
MCH: 25.4 pg — AB (ref 26.0–34.0)
MCHC: 33.3 g/dL (ref 32.0–36.0)
MCV: 76.2 fL — ABNORMAL LOW (ref 80.0–100.0)
Monocytes Absolute: 0.6 10*3/uL (ref 0.2–0.9)
Monocytes Relative: 8 %
NEUTROS PCT: 69 %
Neutro Abs: 5.1 10*3/uL (ref 1.4–6.5)
PLATELETS: 237 10*3/uL (ref 150–440)
RBC: 4.2 MIL/uL (ref 3.80–5.20)
RDW: 17.3 % — ABNORMAL HIGH (ref 11.5–14.5)
WBC: 7.4 10*3/uL (ref 3.6–11.0)

## 2016-04-09 NOTE — Progress Notes (Signed)
Radiation Oncology Follow up Note  Name: Vanessa Hancock   Date:   04/09/2016 MRN:  [id number] DOB: 1931/10/12    This 82 y.o. female presents to the clinic today for a 3 year follow-up for papillary thyroid cancer.  REFERRING PROVIDER: Madelyn Brunner, MD  HPI: Patient is a 82 year old female. Nursing home resident who is now out 3 years. She had a thyroidectomy in 2004 for papillary thyroid cancer followed by radioactive iodine treatment and [facility]. Initial stage was T3 N2. She developed an endobronchial recurrence biopsy-positive. Chin with palliative radiation therapy in May 2014. She also has noted pulmonary nodules which were hypermetabolic in [id number] consistent with metastatic disease. She is doing well currently on Synthroid. She specifically denies head and neck pain or dysphagia. She continues to be hoarse.  COMPLICATIONS OF TREATMENT: none  FOLLOW UP COMPLIANCE: keeps appointments   PHYSICAL EXAM:  BP 115/61 mmHg  Pulse 85  Temp(Src) 97.3 F (36.3 C)  Wt 182 lb 15.7 oz (83 kg) Oral cavity is clear no oral mucosal lesions are identified indirect mirror examination shows upper airway clear vallecula and base of tongue within normal limits. Neck is clear without evidence of subject gastric cervical or supraclavicular adenopathy. Lungs are clear to A&P Well-developed well-nourished patient in NAD. HEENT reveals PERLA, EOMI, discs not visualized.  Oral cavity is clear. No oral mucosal lesions are identified. Neck is clear without evidence of cervical or supraclavicular adenopathy. Lungs are clear to A&P. Cardiac examination is essentially unremarkable with regular rate and rhythm without murmur rub or thrill. Abdomen is benign with no organomegaly or masses noted. Motor sensory and DTR levels are equal and symmetric in the upper and lower extremities. Cranial nerves II through XII are grossly intact. Proprioception is intact. No peripheral adenopathy or edema is identified. No  motor or sensory levels are noted. Crude visual fields are within normal range.  RADIOLOGY RESULTS: Last PET CT in 2016 reviewed  PLAN: Present time patient is clinically stable. She is doing well. I've asked to see her back in 1 year for follow-up and then I will discontinue follow-up care. She continues close follow-up care with medical oncology. Patient knows to call with any concerns.  I would like to take this opportunity to thank you for allowing me to participate in the care of your patient.Armstead Peaks., MD

## 2016-04-09 NOTE — Progress Notes (Signed)
[place]  Telephone:(336) [phone]  Fax:(336) [phone]     Vanessa Hancock DOB: 1932-02-25  MR#: [id number]  ION#:[id number]  Patient Care Team: Madelyn Brunner, MD as PCP - General (Internal Medicine)  CHIEF COMPLAINT:  Chief Complaint  Patient presents with  . Follow-up    INTERVAL HISTORY: Patient returns to clinic today for follow up of papillary thyroid cancer. She is a nursing home resident. She is feeling well today without complaints. She had a thyroidectomy in 2004 for papillary thyroid cancer followed by radioactive iodine treatment and [facility]. Initial stage was T3 N2. She developed an endobronchial recurrence biopsy-positive. Palliative radiation therapy in May 2014. She also has noted pulmonary nodules which were hypermetabolic in 2440 consistent with metastatic disease. She is doing well currently on Synthroid. She specifically denies head and neck pain or dysphagia. She continues to be hoarse.  REVIEW OF SYSTEMS:   Review of Systems  Constitutional: Negative.   HENT:       Hoarseness  Eyes: Negative.   Respiratory: Negative.   Cardiovascular: Negative.   Gastrointestinal: Negative.   Genitourinary: Negative.   Musculoskeletal: Negative.   Skin: Negative.   Neurological: Negative.   Endo/Heme/Allergies: Negative.   Psychiatric/Behavioral: Negative.     As per HPI. Otherwise, a complete review of systems is negatve.  ONCOLOGY HISTORY: Oncology History   82 year old female with right vocal cord paralysis and recurrent papillary thyroid cancer biopsy proven patient had diagnosis of thyroid cancer in 2004 had thyroidectomy followed by radioactive iodine treatment at  [facility].  initial diagnosis of papillary thyroid carcinoma  pT3 pN2 status post thyroidectomy in 2004 Recent endobronchial ultrasound-guided biopsies positive for papillary carcinoma 2.palliative radiation therapy started in May of 2014 3.patient has finished  7000 rads to the laryngeal area in June of 2014     Recurrent thyroid cancer [facility])    PAST MEDICAL HISTORY: Past Medical History  Diagnosis Date  . Cancer ([place])     Thyroid  . Recurrent thyroid cancer ([place]) 03/28/2015    PAST SURGICAL HISTORY: No past surgical history on file.  FAMILY HISTORY No family history on file.    ADVANCED DIRECTIVES:    HEALTH MAINTENANCE: Social History  Substance Use Topics  . Smoking status: Never Smoker   . Smokeless tobacco: Not on file  . Alcohol Use: Not on file    No Known Allergies  Current Outpatient Prescriptions  Medication Sig Dispense Refill  . acetaminophen (TYLENOL) 325 MG tablet Take by mouth.    . ADVAIR DISKUS 250-50 MCG/DOSE AEPB Reported on 04/09/2016    . alendronate (FOSAMAX) 70 MG tablet TAKE (1) ONCE A WEEK 30 MINUTES BEFORE BREAKFAST WITH FULL GLASS OF WA TER. DO NOT LIE DOWN AFTER.    Marland Kitchen antiseptic oral rinse (BIOTENE) LIQD 15 mLs by Mouth Rinse route 2 (two) times daily. 473 mL 3  . ARIPiprazole (ABILIFY) 2 MG tablet Take by mouth.    . Artificial Saliva (BIOTENE MOISTURIZING MOUTH) SOLN SWISH 5-10ML BY MOUTH AND THEN SPIT TWO TIMES A DAY 473 mL 1  . Artificial Saliva (BIOTENE MOISTURIZING MOUTH) SOLN SWISH 5-10ML BY MOUTH AND THEN SPIT TWO TIMES A DAY 473 mL 0  . Artificial Saliva (BIOTENE MOISTURIZING MOUTH) SOLN Swish 5-10 ml by mouth then spit two times a day 473 mL 0  . aspirin EC 81 MG tablet TAKE 1 TABLET BY MOUTH ONCE DAILY.    Marland Kitchen atorvastatin (LIPITOR) 20 MG tablet TAKE 1  TABLET BY MOUTH ONCE DAILY FOR CHOLESTEROL    . calcitRIOL (ROCALTROL) 0.25 MCG capsule TAKE (1) CAPSULE BY MOUTH TWICE DAILY.    . calcium carbonate (CALCIUM 600) 600 MG TABS tablet TAKE (2) TABLETS BY MOUTH THREE TIMES DAILY.    . cetirizine (ZYRTEC) 10 MG tablet TAKE 1 TABLET BY MOUTH ONCE DAILY.    . clopidogrel (PLAVIX) 75 MG tablet TAKE 1 TABLET BY MOUTH ONCE DAILY.    . digoxin (DIGOX) 0.125 MG tablet TAKE 1 TABLET BY MOUTH ONCE DAILY.     . docusate sodium (COLACE) 100 MG capsule TAKE (1) CAPSULE BY MOUTH TWICE DAILY.    . enalapril (VASOTEC) 2.5 MG tablet TAKE (1) TABLET BY MOUTH DAILY FOR HIGH BLOOD PRESSURE.    . escitalopram (LEXAPRO) 20 MG tablet Take by mouth.    . ferrous sulfate 325 (65 FE) MG tablet TAKE 1 TABLET BY MOUTH ONCE DAILY.    . fluticasone (FLONASE) 50 MCG/ACT nasal spray Place into the nose.    . furosemide (LASIX) 20 MG tablet TAKE 1 TABLET BY MOUTH ONCE DAILY.    . levothyroxine (SYNTHROID, LEVOTHROID) 137 MCG tablet TAKE 1 TABLET BY MOUTH ONCE DAILY.    . metoprolol tartrate (LOPRESSOR) 25 MG tablet TAKE 1 TABLET BY MOUTH TWICE DAILY    . Multiple Vitamins-Minerals (I-VITE) TABS TAKE 1 TABLET BY MOUTH ONCE DAILY.    . pantoprazole (PROTONIX) 40 MG tablet TAKE 1 TABLET BY MOUTH ONCE DAILY.    . potassium chloride SA (K-DUR,KLOR-CON) 20 MEQ tablet TAKE 1 TABLET BY MOUTH ONCE DAILY.    . tiotropium (SPIRIVA HANDIHALER) 18 MCG inhalation capsule INHALE 1 CAPSULE AS DIRECTED ONCE DAILY.    . DULERA 100-5 MCG/ACT AERO Reported on 04/09/2016    . ipratropium-albuterol (DUONEB) 0.5-2.5 (3) MG/3ML SOLN Inhale into the lungs.     No current facility-administered medications for this visit.    OBJECTIVE: BP 125/75 mmHg  Pulse 79  Resp 18  Wt 183 lb 3.2 oz (83.1 kg)   There is no height on file to calculate BMI.    ECOG FS:1 - Symptomatic but completely ambulatory  General: Well-developed, well-nourished, no acute distress. Eyes: Pink conjunctiva, anicteric sclera. HEENT: Normocephalic, moist mucous membranes, clear oropharnyx. Lungs: Clear to auscultation bilaterally. Heart: Irregular rate and rhythm, history afib. No rubs, murmurs, or gallops. Musculoskeletal: No edema, cyanosis, or clubbing. Neuro: Alert, answering all questions appropriately. Cranial nerves grossly intact. Skin: No rashes or petechiae noted. Psych: Normal affect.  LAB RESULTS:  Appointment on 04/09/2016  Component Date Value Ref  Range Status  . WBC 04/09/2016 7.4  3.6 - 11.0 K/uL Final  . RBC 04/09/2016 4.20  3.80 - 5.20 MIL/uL Final  . Hemoglobin 04/09/2016 10.7* 12.0 - 16.0 g/dL Final  . HCT 04/09/2016 32.0* 35.0 - 47.0 % Final  . MCV 04/09/2016 76.2* 80.0 - 100.0 fL Final  . MCH 04/09/2016 25.4* 26.0 - 34.0 pg Final  . MCHC 04/09/2016 33.3  32.0 - 36.0 g/dL Final  . RDW 04/09/2016 17.3* 11.5 - 14.5 % Final  . Platelets 04/09/2016 237  150 - 440 K/uL Final  . Neutrophils Relative % 04/09/2016 69   Final  . Neutro Abs 04/09/2016 5.1  1.4 - 6.5 K/uL Final  . Lymphocytes Relative 04/09/2016 18   Final  . Lymphs Abs 04/09/2016 1.3  1.0 - 3.6 K/uL Final  . Monocytes Relative 04/09/2016 8   Final  . Monocytes Absolute 04/09/2016 0.6  0.2 - 0.9 K/uL   Final  . Eosinophils Relative 04/09/2016 4   Final  . Eosinophils Absolute 04/09/2016 0.3  0 - 0.7 K/uL Final  . Basophils Relative 04/09/2016 1   Final  . Basophils Absolute 04/09/2016 0.1  0 - 0.1 K/uL Final  . Sodium 04/09/2016 133* 135 - 145 mmol/L Final  . Potassium 04/09/2016 4.3  3.5 - 5.1 mmol/L Final  . Chloride 04/09/2016 94* 101 - 111 mmol/L Final  . CO2 04/09/2016 32  22 - 32 mmol/L Final  . Glucose, Bld 04/09/2016 94  65 - 99 mg/dL Final  . BUN 04/09/2016 31* 6 - 20 mg/dL Final  . Creatinine, Ser 04/09/2016 0.94  0.44 - 1.00 mg/dL Final  . Calcium 04/09/2016 8.2* 8.9 - 10.3 mg/dL Final  . Total Protein 04/09/2016 7.9  6.5 - 8.1 g/dL Final  . Albumin 04/09/2016 4.1  3.5 - 5.0 g/dL Final  . AST 04/09/2016 24  15 - 41 U/L Final  . ALT 04/09/2016 18  14 - 54 U/L Final  . Alkaline Phosphatase 04/09/2016 64  38 - 126 U/L Final  . Total Bilirubin 04/09/2016 0.6  0.3 - 1.2 mg/dL Final  . GFR calc non Af Amer 04/09/2016 55* >60 mL/min Final  . GFR calc Af Amer 04/09/2016 >60  >60 mL/min Final   Comment: (NOTE) The eGFR has been calculated using the CKD EPI equation. This calculation has not been validated in all clinical situations. eGFR's persistently <60  mL/min signify possible Chronic Kidney Disease.   . Anion gap 04/09/2016 7  5 - 15 Final    STUDIES: No results found.  ASSESSMENT & PLAN:    1. Thyroid cancer- Patient is clinically stable. Her thyroid labs are still pending. Continue 137 g of Synthroid which will be adjusted depending on T4 and TSH. The thyroglobulin keeps on climbing that a PET scan would be ordered.Otherwise return to clinic in 6 months with repeat labs.  2. Anemia- HGB 10.7 today.  3. Hoarseness- Unchanged.  Patient expressed understanding and was in agreement with this plan. She also understands that She can call clinic at any time with any questions, concerns, or complaints.   Dr. [name] was available for consultation and review of plan of care for this patient.   Mayra Reel, NP   04/09/2016 11:07 AM

## 2016-04-10 DIAGNOSIS — E034 Atrophy of thyroid (acquired): Secondary | ICD-10-CM | POA: Diagnosis not present

## 2016-04-10 DIAGNOSIS — I482 Chronic atrial fibrillation: Secondary | ICD-10-CM | POA: Diagnosis not present

## 2016-04-10 DIAGNOSIS — J449 Chronic obstructive pulmonary disease, unspecified: Secondary | ICD-10-CM | POA: Diagnosis not present

## 2016-04-10 LAB — TGAB+THYROGLOBULIN IMA OR RIA

## 2016-04-10 LAB — T4: T4 TOTAL: 10 ug/dL (ref 4.5–12.0)

## 2016-04-10 LAB — THYROGLOBULIN BY IMA: THYROGLOBULIN BY: 11.3 ng/mL (ref 1.5–38.5)

## 2016-04-11 DIAGNOSIS — J449 Chronic obstructive pulmonary disease, unspecified: Secondary | ICD-10-CM | POA: Diagnosis not present

## 2016-04-11 DIAGNOSIS — I482 Chronic atrial fibrillation: Secondary | ICD-10-CM | POA: Diagnosis not present

## 2016-04-11 DIAGNOSIS — E034 Atrophy of thyroid (acquired): Secondary | ICD-10-CM | POA: Diagnosis not present

## 2016-04-12 DIAGNOSIS — E034 Atrophy of thyroid (acquired): Secondary | ICD-10-CM | POA: Diagnosis not present

## 2016-04-12 DIAGNOSIS — J449 Chronic obstructive pulmonary disease, unspecified: Secondary | ICD-10-CM | POA: Diagnosis not present

## 2016-04-12 DIAGNOSIS — I482 Chronic atrial fibrillation: Secondary | ICD-10-CM | POA: Diagnosis not present

## 2016-04-12 DIAGNOSIS — R0902 Hypoxemia: Secondary | ICD-10-CM | POA: Diagnosis not present

## 2016-04-13 DIAGNOSIS — J449 Chronic obstructive pulmonary disease, unspecified: Secondary | ICD-10-CM | POA: Diagnosis not present

## 2016-04-13 DIAGNOSIS — E034 Atrophy of thyroid (acquired): Secondary | ICD-10-CM | POA: Diagnosis not present

## 2016-04-13 DIAGNOSIS — I482 Chronic atrial fibrillation: Secondary | ICD-10-CM | POA: Diagnosis not present

## 2016-04-14 DIAGNOSIS — I482 Chronic atrial fibrillation: Secondary | ICD-10-CM | POA: Diagnosis not present

## 2016-04-14 DIAGNOSIS — J449 Chronic obstructive pulmonary disease, unspecified: Secondary | ICD-10-CM | POA: Diagnosis not present

## 2016-04-14 DIAGNOSIS — E034 Atrophy of thyroid (acquired): Secondary | ICD-10-CM | POA: Diagnosis not present

## 2016-04-15 DIAGNOSIS — F0280 Dementia in other diseases classified elsewhere without behavioral disturbance: Secondary | ICD-10-CM | POA: Diagnosis not present

## 2016-04-15 DIAGNOSIS — G4733 Obstructive sleep apnea (adult) (pediatric): Secondary | ICD-10-CM | POA: Diagnosis not present

## 2016-04-15 DIAGNOSIS — G301 Alzheimer's disease with late onset: Secondary | ICD-10-CM | POA: Diagnosis not present

## 2016-04-15 DIAGNOSIS — I482 Chronic atrial fibrillation: Secondary | ICD-10-CM | POA: Diagnosis not present

## 2016-04-15 DIAGNOSIS — F329 Major depressive disorder, single episode, unspecified: Secondary | ICD-10-CM | POA: Diagnosis not present

## 2016-04-15 DIAGNOSIS — J449 Chronic obstructive pulmonary disease, unspecified: Secondary | ICD-10-CM | POA: Diagnosis not present

## 2016-04-15 DIAGNOSIS — R32 Unspecified urinary incontinence: Secondary | ICD-10-CM | POA: Insufficient documentation

## 2016-04-15 DIAGNOSIS — Z0001 Encounter for general adult medical examination with abnormal findings: Secondary | ICD-10-CM | POA: Diagnosis not present

## 2016-04-15 DIAGNOSIS — E034 Atrophy of thyroid (acquired): Secondary | ICD-10-CM | POA: Diagnosis not present

## 2016-04-16 DIAGNOSIS — I482 Chronic atrial fibrillation: Secondary | ICD-10-CM | POA: Diagnosis not present

## 2016-04-16 DIAGNOSIS — J449 Chronic obstructive pulmonary disease, unspecified: Secondary | ICD-10-CM | POA: Diagnosis not present

## 2016-04-16 DIAGNOSIS — E034 Atrophy of thyroid (acquired): Secondary | ICD-10-CM | POA: Diagnosis not present

## 2016-04-17 DIAGNOSIS — I482 Chronic atrial fibrillation: Secondary | ICD-10-CM | POA: Diagnosis not present

## 2016-04-17 DIAGNOSIS — E034 Atrophy of thyroid (acquired): Secondary | ICD-10-CM | POA: Diagnosis not present

## 2016-04-17 DIAGNOSIS — J449 Chronic obstructive pulmonary disease, unspecified: Secondary | ICD-10-CM | POA: Diagnosis not present

## 2016-04-18 DIAGNOSIS — J449 Chronic obstructive pulmonary disease, unspecified: Secondary | ICD-10-CM | POA: Diagnosis not present

## 2016-04-18 DIAGNOSIS — I482 Chronic atrial fibrillation: Secondary | ICD-10-CM | POA: Diagnosis not present

## 2016-04-18 DIAGNOSIS — E034 Atrophy of thyroid (acquired): Secondary | ICD-10-CM | POA: Diagnosis not present

## 2016-04-19 DIAGNOSIS — R350 Frequency of micturition: Secondary | ICD-10-CM | POA: Diagnosis not present

## 2016-04-19 DIAGNOSIS — J449 Chronic obstructive pulmonary disease, unspecified: Secondary | ICD-10-CM | POA: Diagnosis not present

## 2016-04-19 DIAGNOSIS — E034 Atrophy of thyroid (acquired): Secondary | ICD-10-CM | POA: Diagnosis not present

## 2016-04-19 DIAGNOSIS — I482 Chronic atrial fibrillation: Secondary | ICD-10-CM | POA: Diagnosis not present

## 2016-04-20 DIAGNOSIS — I482 Chronic atrial fibrillation: Secondary | ICD-10-CM | POA: Diagnosis not present

## 2016-04-20 DIAGNOSIS — E034 Atrophy of thyroid (acquired): Secondary | ICD-10-CM | POA: Diagnosis not present

## 2016-04-20 DIAGNOSIS — J449 Chronic obstructive pulmonary disease, unspecified: Secondary | ICD-10-CM | POA: Diagnosis not present

## 2016-04-21 DIAGNOSIS — J449 Chronic obstructive pulmonary disease, unspecified: Secondary | ICD-10-CM | POA: Diagnosis not present

## 2016-04-21 DIAGNOSIS — E034 Atrophy of thyroid (acquired): Secondary | ICD-10-CM | POA: Diagnosis not present

## 2016-04-21 DIAGNOSIS — I482 Chronic atrial fibrillation: Secondary | ICD-10-CM | POA: Diagnosis not present

## 2016-04-22 DIAGNOSIS — I482 Chronic atrial fibrillation: Secondary | ICD-10-CM | POA: Diagnosis not present

## 2016-04-22 DIAGNOSIS — E034 Atrophy of thyroid (acquired): Secondary | ICD-10-CM | POA: Diagnosis not present

## 2016-04-22 DIAGNOSIS — J449 Chronic obstructive pulmonary disease, unspecified: Secondary | ICD-10-CM | POA: Diagnosis not present

## 2016-04-23 DIAGNOSIS — G4733 Obstructive sleep apnea (adult) (pediatric): Secondary | ICD-10-CM | POA: Diagnosis not present

## 2016-04-23 DIAGNOSIS — J449 Chronic obstructive pulmonary disease, unspecified: Secondary | ICD-10-CM | POA: Diagnosis not present

## 2016-04-23 DIAGNOSIS — E034 Atrophy of thyroid (acquired): Secondary | ICD-10-CM | POA: Diagnosis not present

## 2016-04-23 DIAGNOSIS — I482 Chronic atrial fibrillation: Secondary | ICD-10-CM | POA: Diagnosis not present

## 2016-04-24 DIAGNOSIS — G4733 Obstructive sleep apnea (adult) (pediatric): Secondary | ICD-10-CM | POA: Diagnosis not present

## 2016-04-24 DIAGNOSIS — I482 Chronic atrial fibrillation: Secondary | ICD-10-CM | POA: Diagnosis not present

## 2016-04-24 DIAGNOSIS — J449 Chronic obstructive pulmonary disease, unspecified: Secondary | ICD-10-CM | POA: Diagnosis not present

## 2016-04-24 DIAGNOSIS — E034 Atrophy of thyroid (acquired): Secondary | ICD-10-CM | POA: Diagnosis not present

## 2016-04-24 DIAGNOSIS — R0602 Shortness of breath: Secondary | ICD-10-CM | POA: Diagnosis not present

## 2016-04-25 DIAGNOSIS — J449 Chronic obstructive pulmonary disease, unspecified: Secondary | ICD-10-CM | POA: Diagnosis not present

## 2016-04-25 DIAGNOSIS — I482 Chronic atrial fibrillation: Secondary | ICD-10-CM | POA: Diagnosis not present

## 2016-04-25 DIAGNOSIS — G4733 Obstructive sleep apnea (adult) (pediatric): Secondary | ICD-10-CM | POA: Diagnosis not present

## 2016-04-25 DIAGNOSIS — E034 Atrophy of thyroid (acquired): Secondary | ICD-10-CM | POA: Diagnosis not present

## 2016-04-26 DIAGNOSIS — I482 Chronic atrial fibrillation: Secondary | ICD-10-CM | POA: Diagnosis not present

## 2016-04-26 DIAGNOSIS — J449 Chronic obstructive pulmonary disease, unspecified: Secondary | ICD-10-CM | POA: Diagnosis not present

## 2016-04-26 DIAGNOSIS — R32 Unspecified urinary incontinence: Secondary | ICD-10-CM | POA: Diagnosis not present

## 2016-04-26 DIAGNOSIS — G4733 Obstructive sleep apnea (adult) (pediatric): Secondary | ICD-10-CM | POA: Diagnosis not present

## 2016-04-26 DIAGNOSIS — E034 Atrophy of thyroid (acquired): Secondary | ICD-10-CM | POA: Diagnosis not present

## 2016-04-27 DIAGNOSIS — G4733 Obstructive sleep apnea (adult) (pediatric): Secondary | ICD-10-CM | POA: Diagnosis not present

## 2016-04-27 DIAGNOSIS — I482 Chronic atrial fibrillation: Secondary | ICD-10-CM | POA: Diagnosis not present

## 2016-04-27 DIAGNOSIS — J449 Chronic obstructive pulmonary disease, unspecified: Secondary | ICD-10-CM | POA: Diagnosis not present

## 2016-04-27 DIAGNOSIS — E034 Atrophy of thyroid (acquired): Secondary | ICD-10-CM | POA: Diagnosis not present

## 2016-04-28 DIAGNOSIS — J449 Chronic obstructive pulmonary disease, unspecified: Secondary | ICD-10-CM | POA: Diagnosis not present

## 2016-04-28 DIAGNOSIS — G4733 Obstructive sleep apnea (adult) (pediatric): Secondary | ICD-10-CM | POA: Diagnosis not present

## 2016-04-28 DIAGNOSIS — E034 Atrophy of thyroid (acquired): Secondary | ICD-10-CM | POA: Diagnosis not present

## 2016-04-28 DIAGNOSIS — I482 Chronic atrial fibrillation: Secondary | ICD-10-CM | POA: Diagnosis not present

## 2016-04-29 DIAGNOSIS — I482 Chronic atrial fibrillation: Secondary | ICD-10-CM | POA: Diagnosis not present

## 2016-04-29 DIAGNOSIS — G4733 Obstructive sleep apnea (adult) (pediatric): Secondary | ICD-10-CM | POA: Diagnosis not present

## 2016-04-29 DIAGNOSIS — E034 Atrophy of thyroid (acquired): Secondary | ICD-10-CM | POA: Diagnosis not present

## 2016-04-29 DIAGNOSIS — J449 Chronic obstructive pulmonary disease, unspecified: Secondary | ICD-10-CM | POA: Diagnosis not present

## 2016-04-30 DIAGNOSIS — G4733 Obstructive sleep apnea (adult) (pediatric): Secondary | ICD-10-CM | POA: Diagnosis not present

## 2016-04-30 DIAGNOSIS — J449 Chronic obstructive pulmonary disease, unspecified: Secondary | ICD-10-CM | POA: Diagnosis not present

## 2016-04-30 DIAGNOSIS — I482 Chronic atrial fibrillation: Secondary | ICD-10-CM | POA: Diagnosis not present

## 2016-04-30 DIAGNOSIS — E034 Atrophy of thyroid (acquired): Secondary | ICD-10-CM | POA: Diagnosis not present

## 2016-05-01 DIAGNOSIS — J449 Chronic obstructive pulmonary disease, unspecified: Secondary | ICD-10-CM | POA: Diagnosis not present

## 2016-05-01 DIAGNOSIS — I482 Chronic atrial fibrillation: Secondary | ICD-10-CM | POA: Diagnosis not present

## 2016-05-01 DIAGNOSIS — E034 Atrophy of thyroid (acquired): Secondary | ICD-10-CM | POA: Diagnosis not present

## 2016-05-01 DIAGNOSIS — G4733 Obstructive sleep apnea (adult) (pediatric): Secondary | ICD-10-CM | POA: Diagnosis not present

## 2016-05-02 DIAGNOSIS — E034 Atrophy of thyroid (acquired): Secondary | ICD-10-CM | POA: Diagnosis not present

## 2016-05-02 DIAGNOSIS — J449 Chronic obstructive pulmonary disease, unspecified: Secondary | ICD-10-CM | POA: Diagnosis not present

## 2016-05-02 DIAGNOSIS — G4733 Obstructive sleep apnea (adult) (pediatric): Secondary | ICD-10-CM | POA: Diagnosis not present

## 2016-05-02 DIAGNOSIS — I482 Chronic atrial fibrillation: Secondary | ICD-10-CM | POA: Diagnosis not present

## 2016-05-03 DIAGNOSIS — G4733 Obstructive sleep apnea (adult) (pediatric): Secondary | ICD-10-CM | POA: Diagnosis not present

## 2016-05-03 DIAGNOSIS — E034 Atrophy of thyroid (acquired): Secondary | ICD-10-CM | POA: Diagnosis not present

## 2016-05-03 DIAGNOSIS — J449 Chronic obstructive pulmonary disease, unspecified: Secondary | ICD-10-CM | POA: Diagnosis not present

## 2016-05-03 DIAGNOSIS — I482 Chronic atrial fibrillation: Secondary | ICD-10-CM | POA: Diagnosis not present

## 2016-05-04 DIAGNOSIS — G4733 Obstructive sleep apnea (adult) (pediatric): Secondary | ICD-10-CM | POA: Diagnosis not present

## 2016-05-04 DIAGNOSIS — J449 Chronic obstructive pulmonary disease, unspecified: Secondary | ICD-10-CM | POA: Diagnosis not present

## 2016-05-04 DIAGNOSIS — I482 Chronic atrial fibrillation: Secondary | ICD-10-CM | POA: Diagnosis not present

## 2016-05-04 DIAGNOSIS — E034 Atrophy of thyroid (acquired): Secondary | ICD-10-CM | POA: Diagnosis not present

## 2016-05-05 DIAGNOSIS — E034 Atrophy of thyroid (acquired): Secondary | ICD-10-CM | POA: Diagnosis not present

## 2016-05-05 DIAGNOSIS — J449 Chronic obstructive pulmonary disease, unspecified: Secondary | ICD-10-CM | POA: Diagnosis not present

## 2016-05-05 DIAGNOSIS — I482 Chronic atrial fibrillation: Secondary | ICD-10-CM | POA: Diagnosis not present

## 2016-05-05 DIAGNOSIS — G4733 Obstructive sleep apnea (adult) (pediatric): Secondary | ICD-10-CM | POA: Diagnosis not present

## 2016-05-06 DIAGNOSIS — J449 Chronic obstructive pulmonary disease, unspecified: Secondary | ICD-10-CM | POA: Diagnosis not present

## 2016-05-06 DIAGNOSIS — I482 Chronic atrial fibrillation: Secondary | ICD-10-CM | POA: Diagnosis not present

## 2016-05-06 DIAGNOSIS — G4733 Obstructive sleep apnea (adult) (pediatric): Secondary | ICD-10-CM | POA: Diagnosis not present

## 2016-05-06 DIAGNOSIS — E034 Atrophy of thyroid (acquired): Secondary | ICD-10-CM | POA: Diagnosis not present

## 2016-05-07 DIAGNOSIS — I482 Chronic atrial fibrillation: Secondary | ICD-10-CM | POA: Diagnosis not present

## 2016-05-07 DIAGNOSIS — E034 Atrophy of thyroid (acquired): Secondary | ICD-10-CM | POA: Diagnosis not present

## 2016-05-07 DIAGNOSIS — G4733 Obstructive sleep apnea (adult) (pediatric): Secondary | ICD-10-CM | POA: Diagnosis not present

## 2016-05-07 DIAGNOSIS — J449 Chronic obstructive pulmonary disease, unspecified: Secondary | ICD-10-CM | POA: Diagnosis not present

## 2016-05-08 DIAGNOSIS — J449 Chronic obstructive pulmonary disease, unspecified: Secondary | ICD-10-CM | POA: Diagnosis not present

## 2016-05-08 DIAGNOSIS — I482 Chronic atrial fibrillation: Secondary | ICD-10-CM | POA: Diagnosis not present

## 2016-05-08 DIAGNOSIS — E034 Atrophy of thyroid (acquired): Secondary | ICD-10-CM | POA: Diagnosis not present

## 2016-05-09 DIAGNOSIS — E034 Atrophy of thyroid (acquired): Secondary | ICD-10-CM | POA: Diagnosis not present

## 2016-05-09 DIAGNOSIS — J449 Chronic obstructive pulmonary disease, unspecified: Secondary | ICD-10-CM | POA: Diagnosis not present

## 2016-05-09 DIAGNOSIS — I482 Chronic atrial fibrillation: Secondary | ICD-10-CM | POA: Diagnosis not present

## 2016-05-10 DIAGNOSIS — I482 Chronic atrial fibrillation: Secondary | ICD-10-CM | POA: Diagnosis not present

## 2016-05-10 DIAGNOSIS — E034 Atrophy of thyroid (acquired): Secondary | ICD-10-CM | POA: Diagnosis not present

## 2016-05-10 DIAGNOSIS — J449 Chronic obstructive pulmonary disease, unspecified: Secondary | ICD-10-CM | POA: Diagnosis not present

## 2016-05-11 DIAGNOSIS — I482 Chronic atrial fibrillation: Secondary | ICD-10-CM | POA: Diagnosis not present

## 2016-05-11 DIAGNOSIS — E034 Atrophy of thyroid (acquired): Secondary | ICD-10-CM | POA: Diagnosis not present

## 2016-05-11 DIAGNOSIS — J449 Chronic obstructive pulmonary disease, unspecified: Secondary | ICD-10-CM | POA: Diagnosis not present

## 2016-05-12 DIAGNOSIS — J449 Chronic obstructive pulmonary disease, unspecified: Secondary | ICD-10-CM | POA: Diagnosis not present

## 2016-05-12 DIAGNOSIS — I482 Chronic atrial fibrillation: Secondary | ICD-10-CM | POA: Diagnosis not present

## 2016-05-12 DIAGNOSIS — E034 Atrophy of thyroid (acquired): Secondary | ICD-10-CM | POA: Diagnosis not present

## 2016-05-13 DIAGNOSIS — R0902 Hypoxemia: Secondary | ICD-10-CM | POA: Diagnosis not present

## 2016-05-13 DIAGNOSIS — I482 Chronic atrial fibrillation: Secondary | ICD-10-CM | POA: Diagnosis not present

## 2016-05-13 DIAGNOSIS — J449 Chronic obstructive pulmonary disease, unspecified: Secondary | ICD-10-CM | POA: Diagnosis not present

## 2016-05-13 DIAGNOSIS — E034 Atrophy of thyroid (acquired): Secondary | ICD-10-CM | POA: Diagnosis not present

## 2016-05-14 DIAGNOSIS — I482 Chronic atrial fibrillation: Secondary | ICD-10-CM | POA: Diagnosis not present

## 2016-05-14 DIAGNOSIS — J449 Chronic obstructive pulmonary disease, unspecified: Secondary | ICD-10-CM | POA: Diagnosis not present

## 2016-05-14 DIAGNOSIS — E034 Atrophy of thyroid (acquired): Secondary | ICD-10-CM | POA: Diagnosis not present

## 2016-05-15 DIAGNOSIS — I482 Chronic atrial fibrillation: Secondary | ICD-10-CM | POA: Diagnosis not present

## 2016-05-15 DIAGNOSIS — J449 Chronic obstructive pulmonary disease, unspecified: Secondary | ICD-10-CM | POA: Diagnosis not present

## 2016-05-15 DIAGNOSIS — E034 Atrophy of thyroid (acquired): Secondary | ICD-10-CM | POA: Diagnosis not present

## 2016-05-16 DIAGNOSIS — J449 Chronic obstructive pulmonary disease, unspecified: Secondary | ICD-10-CM | POA: Diagnosis not present

## 2016-05-16 DIAGNOSIS — E034 Atrophy of thyroid (acquired): Secondary | ICD-10-CM | POA: Diagnosis not present

## 2016-05-16 DIAGNOSIS — I482 Chronic atrial fibrillation: Secondary | ICD-10-CM | POA: Diagnosis not present

## 2016-05-17 DIAGNOSIS — I482 Chronic atrial fibrillation: Secondary | ICD-10-CM | POA: Diagnosis not present

## 2016-05-17 DIAGNOSIS — E034 Atrophy of thyroid (acquired): Secondary | ICD-10-CM | POA: Diagnosis not present

## 2016-05-17 DIAGNOSIS — J449 Chronic obstructive pulmonary disease, unspecified: Secondary | ICD-10-CM | POA: Diagnosis not present

## 2016-05-18 DIAGNOSIS — I482 Chronic atrial fibrillation: Secondary | ICD-10-CM | POA: Diagnosis not present

## 2016-05-18 DIAGNOSIS — J449 Chronic obstructive pulmonary disease, unspecified: Secondary | ICD-10-CM | POA: Diagnosis not present

## 2016-05-18 DIAGNOSIS — E034 Atrophy of thyroid (acquired): Secondary | ICD-10-CM | POA: Diagnosis not present

## 2016-05-19 DIAGNOSIS — J449 Chronic obstructive pulmonary disease, unspecified: Secondary | ICD-10-CM | POA: Diagnosis not present

## 2016-05-19 DIAGNOSIS — E034 Atrophy of thyroid (acquired): Secondary | ICD-10-CM | POA: Diagnosis not present

## 2016-05-19 DIAGNOSIS — I482 Chronic atrial fibrillation: Secondary | ICD-10-CM | POA: Diagnosis not present

## 2016-05-20 ENCOUNTER — Encounter: Payer: Self-pay | Admitting: Emergency Medicine

## 2016-05-20 ENCOUNTER — Emergency Department
Admission: EM | Admit: 2016-05-20 | Discharge: 2016-05-20 | Disposition: A | Discharge status: Home / Self Care | Payer: Commercial Managed Care - HMO | Attending: Emergency Medicine | Admitting: Emergency Medicine

## 2016-05-20 DIAGNOSIS — Z87891 Personal history of nicotine dependence: Secondary | ICD-10-CM | POA: Insufficient documentation

## 2016-05-20 DIAGNOSIS — R58 Hemorrhage, not elsewhere classified: Secondary | ICD-10-CM

## 2016-05-20 DIAGNOSIS — Z85850 Personal history of malignant neoplasm of thyroid: Secondary | ICD-10-CM | POA: Diagnosis not present

## 2016-05-20 DIAGNOSIS — R233 Spontaneous ecchymoses: Secondary | ICD-10-CM | POA: Diagnosis not present

## 2016-05-20 DIAGNOSIS — E034 Atrophy of thyroid (acquired): Secondary | ICD-10-CM | POA: Diagnosis not present

## 2016-05-20 DIAGNOSIS — E039 Hypothyroidism, unspecified: Secondary | ICD-10-CM | POA: Insufficient documentation

## 2016-05-20 DIAGNOSIS — J449 Chronic obstructive pulmonary disease, unspecified: Secondary | ICD-10-CM | POA: Insufficient documentation

## 2016-05-20 DIAGNOSIS — I482 Chronic atrial fibrillation: Secondary | ICD-10-CM | POA: Diagnosis not present

## 2016-05-20 DIAGNOSIS — I509 Heart failure, unspecified: Secondary | ICD-10-CM | POA: Diagnosis not present

## 2016-05-20 DIAGNOSIS — G300 Alzheimer's disease with early onset: Secondary | ICD-10-CM | POA: Diagnosis not present

## 2016-05-20 DIAGNOSIS — L819 Disorder of pigmentation, unspecified: Secondary | ICD-10-CM | POA: Diagnosis present

## 2016-05-20 HISTORY — DX: Chronic obstructive pulmonary disease, unspecified: J44.9

## 2016-05-20 HISTORY — DX: Alzheimer's disease, unspecified: G30.9

## 2016-05-20 HISTORY — DX: Dementia in other diseases classified elsewhere, unspecified severity, without behavioral disturbance, psychotic disturbance, mood disturbance, and anxiety: F02.80

## 2016-05-20 HISTORY — DX: Heart failure, unspecified: I50.9

## 2016-05-20 HISTORY — DX: Unspecified atrial fibrillation: I48.91

## 2016-05-20 HISTORY — DX: Sleep apnea, unspecified: G47.30

## 2016-05-20 HISTORY — DX: Unspecified macular degeneration: H35.30

## 2016-05-20 LAB — BASIC METABOLIC PANEL
ANION GAP: 10 (ref 5–15)
BUN: 37 mg/dL — ABNORMAL HIGH (ref 6–20)
CALCIUM: 8.4 mg/dL — AB (ref 8.9–10.3)
CO2: 31 mmol/L (ref 22–32)
Chloride: 96 mmol/L — ABNORMAL LOW (ref 101–111)
Creatinine, Ser: 1.09 mg/dL — ABNORMAL HIGH (ref 0.44–1.00)
GFR calc non Af Amer: 46 mL/min — ABNORMAL LOW (ref >60)
GFR, EST AFRICAN AMERICAN: 53 mL/min — AB (ref >60)
Glucose, Bld: 131 mg/dL — ABNORMAL HIGH (ref 65–99)
POTASSIUM: 4.7 mmol/L (ref 3.5–5.1)
Sodium: 137 mmol/L (ref 135–145)

## 2016-05-20 LAB — CBC WITH DIFFERENTIAL/PLATELET
BASOS ABS: 0.1 10*3/uL (ref 0–0.1)
BASOS PCT: 1 %
Eosinophils Absolute: 0.2 10*3/uL (ref 0–0.7)
Eosinophils Relative: 3 %
HEMATOCRIT: 33.3 % — AB (ref 35.0–47.0)
HEMOGLOBIN: 11.3 g/dL — AB (ref 12.0–16.0)
LYMPHS PCT: 19 %
Lymphs Abs: 1.4 10*3/uL (ref 1.0–3.6)
MCH: 26.3 pg (ref 26.0–34.0)
MCHC: 34 g/dL (ref 32.0–36.0)
MCV: 77.5 fL — AB (ref 80.0–100.0)
MONO ABS: 0.5 10*3/uL (ref 0.2–0.9)
Monocytes Relative: 7 %
NEUTROS ABS: 5.2 10*3/uL (ref 1.4–6.5)
NEUTROS PCT: 70 %
Platelets: 240 10*3/uL (ref 150–440)
RBC: 4.3 MIL/uL (ref 3.80–5.20)
RDW: 17.7 % — AB (ref 11.5–14.5)
WBC: 7.4 10*3/uL (ref 3.6–11.0)

## 2016-05-20 LAB — HEPATIC FUNCTION PANEL
ALBUMIN: 4.1 g/dL (ref 3.5–5.0)
ALT: 17 U/L (ref 14–54)
AST: 24 U/L (ref 15–41)
Alkaline Phosphatase: 60 U/L (ref 38–126)
BILIRUBIN TOTAL: 0.6 mg/dL (ref 0.3–1.2)
Bilirubin, Direct: <0.1 mg/dL — ABNORMAL LOW (ref 0.1–0.5)
Total Protein: 8 g/dL (ref 6.5–8.1)

## 2016-05-20 LAB — PROTIME-INR
INR: 1.05
Prothrombin Time: 13.7 seconds (ref 11.4–15.2)

## 2016-05-20 NOTE — ED Notes (Signed)
Pt in via triage with daughter and caregiver to bedside.  Pt caregiver reports noticing new bruises to pt buttocks and one to right forearm.  Caregiver denies any recent falls or injuries.  Caregiver reports pt is on plavix and was advised by PCP to come to ED.  Pt denies any pain.  No immediate distress noted.

## 2016-05-20 NOTE — ED Triage Notes (Signed)
Thursday a small discoloration was noted to left buttock.  Area of discoloration increased in size over the weekend and a second area on patient's buttocks was also seen and has gotten larger. Today, a third area of discoloration was seen to patient's left forearm.  PCP referred pt to ED for evaluation.

## 2016-05-20 NOTE — ED Provider Notes (Signed)
[facility] Emergency Department Provider Note        Time seen: ----------------------------------------- 1:38 PM on 05/20/2016 -----------------------------------------    I have reviewed the triage vital signs and the nursing notes.   HISTORY  Chief Complaint Skin Discoloration    HPI Vanessa Hancock is a 82 y.o. female brought to the ER for bruising noted to the buttocks and arm today. Thursday small discoloration was noted on the left buttock, the area of discoloration increased in size over the weekend and a second area was noted on her buttocks. This was seemed to have gotten larger and she was sent to the ER for evaluation. She was noted to have bruising on her right arm today. Patient denies any pain, denies any recent illness or fever.   Past Medical History:  Diagnosis Date  . A-fib ([place])   . Alzheimer disease    early stages (2016)  . Cancer ([place])    Thyroid  . CHF (congestive heart failure) ([place])   . COPD (chronic obstructive pulmonary disease) ([place])   . Macular degeneration   . Recurrent thyroid cancer ([place]) 03/28/2015  . Sleep apnea     Patient Active Problem List   Diagnosis Date Noted  . Malignant neoplasm of thyroid gland ([place]) 04/10/2015  . Recurrent thyroid cancer ([place]) 03/28/2015  . Atrial fibrillation, chronic ([place]) 04/07/2014  . Chronic depression 04/07/2014  . CAFL (chronic airflow limitation) ([place]) 04/07/2014  . H/O disease 04/07/2014  . Adult hypothyroidism 04/07/2014  . Obstructive apnea 04/07/2014  . Chronic atrial fibrillation ([place]) 04/07/2014  . Chronic obstructive pulmonary disease ([place]) 04/07/2014    Past Surgical History:  Procedure Laterality Date  . APPENDECTOMY    . THYROIDECTOMY    . TONSILLECTOMY    . vericose vein      Allergies Review of patient's allergies indicates no known allergies.  Social History Social History  Substance Use Topics  . Smoking status: Former Research scientist (life sciences)  . Smokeless  tobacco: Never Used  . Alcohol use Not on file    Review of Systems Constitutional: Negative for fever. Cardiovascular: Negative for chest pain. Respiratory: Negative for shortness of breath. Gastrointestinal: Negative for abdominal pain, vomiting and diarrhea. Genitourinary: Negative for dysuria. Musculoskeletal: Negative for back pain. Skin: Positive for skin bruising Neurological: Negative for headaches, focal weakness or numbness.  10-point ROS otherwise negative.  ____________________________________________   PHYSICAL EXAM:  VITAL SIGNS: ED Triage Vitals  Enc Vitals Group     BP 05/20/16 1112 (!) 132/50     Pulse Rate 05/20/16 1110 77     Resp 05/20/16 1110 20     Temp 05/20/16 1110 99 F (37.2 C)     Temp Source 05/20/16 1110 Oral     SpO2 --      Weight 05/20/16 1111 179 lb (81.2 kg)     Height 05/20/16 1111 5\' 8"  (1.727 m)     Head Circumference --      Peak Flow --      Pain Score 05/20/16 1123 0     Pain Loc --      Pain Edu? --      Excl. in [place]? --     Constitutional: Alert, Well appearing and in no distress. Eyes: Conjunctivae are normal.  Normal extraocular movements. Cardiovascular: Normal rate, regular rhythm. No murmurs, rubs, or gallops. Respiratory: Normal respiratory effort without tachypnea nor retractions. Breath sounds are clear and equal bilaterally. No wheezes/rales/rhonchi. Gastrointestinal: Soft and nontender. Distended, normal bowel  sounds Musculoskeletal: Nontender with normal range of motion in all extremities. No lower extremity tenderness nor edema. Neurologic:  Normal speech and language. No gross focal neurologic deficits are appreciated.  Skin:  Skin is warm, dry and intact. Small round ecchymosis noted to the right forearm, large area of bruising involving the left buttock area approaching the gluteal cleft. This is approximately 10 cm, no significant tenderness is noted. Psychiatric: Mood and affect are normal. Speech and behavior  are normal.  ____________________________________________  ED COURSE:  Pertinent labs & imaging results that were available during my care of the patient were reviewed by me and considered in my medical decision making (see chart for details). Clinical Course  Patients in no acute distress, is brought in for asymptomatic bruising. Caregivers are adamant that they're not concerned about abuse, there is been no falls or trauma. We will check basic labs and reevaluate.  Procedures ____________________________________________   LABS (pertinent positives/negatives)  Labs Reviewed  CBC WITH DIFFERENTIAL/PLATELET - Abnormal; Notable for the following:       Result Value   Hemoglobin 11.3 (*)    HCT 33.3 (*)    MCV 77.5 (*)    RDW 17.7 (*)    All other components within normal limits  BASIC METABOLIC PANEL - Abnormal; Notable for the following:    Chloride 96 (*)    Glucose, Bld 131 (*)    BUN 37 (*)    Creatinine, Ser 1.09 (*)    Calcium 8.4 (*)    GFR calc non Af Amer 46 (*)    GFR calc Af Amer 53 (*)    All other components within normal limits  HEPATIC FUNCTION PANEL - Abnormal; Notable for the following:    Bilirubin, Direct <0.1 (*)    All other components within normal limits  PROTIME-INR  ____________________________________________  FINAL ASSESSMENT AND PLAN  Ecchymosis  Plan: Patient with labs as dictated above. No specific etiology for her bruising. She does not appear to be coagulopathic. I have advised that she not take Plavix. She is stable for outpatient follow-up.   Earleen Newport, MD   Note: This dictation was prepared with Dragon dictation. Any transcriptional errors that result from this process are unintentional    Earleen Newport, MD 05/20/16 [phone]

## 2016-05-21 DIAGNOSIS — I482 Chronic atrial fibrillation: Secondary | ICD-10-CM | POA: Diagnosis not present

## 2016-05-21 DIAGNOSIS — J449 Chronic obstructive pulmonary disease, unspecified: Secondary | ICD-10-CM | POA: Diagnosis not present

## 2016-05-21 DIAGNOSIS — E034 Atrophy of thyroid (acquired): Secondary | ICD-10-CM | POA: Diagnosis not present

## 2016-05-22 DIAGNOSIS — E034 Atrophy of thyroid (acquired): Secondary | ICD-10-CM | POA: Diagnosis not present

## 2016-05-22 DIAGNOSIS — I482 Chronic atrial fibrillation: Secondary | ICD-10-CM | POA: Diagnosis not present

## 2016-05-22 DIAGNOSIS — J449 Chronic obstructive pulmonary disease, unspecified: Secondary | ICD-10-CM | POA: Diagnosis not present

## 2016-05-23 DIAGNOSIS — E034 Atrophy of thyroid (acquired): Secondary | ICD-10-CM | POA: Diagnosis not present

## 2016-05-23 DIAGNOSIS — I482 Chronic atrial fibrillation: Secondary | ICD-10-CM | POA: Diagnosis not present

## 2016-05-23 DIAGNOSIS — J449 Chronic obstructive pulmonary disease, unspecified: Secondary | ICD-10-CM | POA: Diagnosis not present

## 2016-05-24 DIAGNOSIS — G4733 Obstructive sleep apnea (adult) (pediatric): Secondary | ICD-10-CM | POA: Diagnosis not present

## 2016-05-24 DIAGNOSIS — E034 Atrophy of thyroid (acquired): Secondary | ICD-10-CM | POA: Diagnosis not present

## 2016-05-24 DIAGNOSIS — J449 Chronic obstructive pulmonary disease, unspecified: Secondary | ICD-10-CM | POA: Diagnosis not present

## 2016-05-24 DIAGNOSIS — I482 Chronic atrial fibrillation: Secondary | ICD-10-CM | POA: Diagnosis not present

## 2016-05-25 DIAGNOSIS — E034 Atrophy of thyroid (acquired): Secondary | ICD-10-CM | POA: Diagnosis not present

## 2016-05-25 DIAGNOSIS — I482 Chronic atrial fibrillation: Secondary | ICD-10-CM | POA: Diagnosis not present

## 2016-05-25 DIAGNOSIS — J449 Chronic obstructive pulmonary disease, unspecified: Secondary | ICD-10-CM | POA: Diagnosis not present

## 2016-05-25 DIAGNOSIS — G4733 Obstructive sleep apnea (adult) (pediatric): Secondary | ICD-10-CM | POA: Diagnosis not present

## 2016-05-26 DIAGNOSIS — G4733 Obstructive sleep apnea (adult) (pediatric): Secondary | ICD-10-CM | POA: Diagnosis not present

## 2016-05-26 DIAGNOSIS — I482 Chronic atrial fibrillation: Secondary | ICD-10-CM | POA: Diagnosis not present

## 2016-05-26 DIAGNOSIS — E034 Atrophy of thyroid (acquired): Secondary | ICD-10-CM | POA: Diagnosis not present

## 2016-05-26 DIAGNOSIS — J449 Chronic obstructive pulmonary disease, unspecified: Secondary | ICD-10-CM | POA: Diagnosis not present

## 2016-05-27 DIAGNOSIS — J449 Chronic obstructive pulmonary disease, unspecified: Secondary | ICD-10-CM | POA: Diagnosis not present

## 2016-05-27 DIAGNOSIS — I482 Chronic atrial fibrillation: Secondary | ICD-10-CM | POA: Diagnosis not present

## 2016-05-27 DIAGNOSIS — G4733 Obstructive sleep apnea (adult) (pediatric): Secondary | ICD-10-CM | POA: Diagnosis not present

## 2016-05-27 DIAGNOSIS — E034 Atrophy of thyroid (acquired): Secondary | ICD-10-CM | POA: Diagnosis not present

## 2016-05-28 DIAGNOSIS — I482 Chronic atrial fibrillation: Secondary | ICD-10-CM | POA: Diagnosis not present

## 2016-05-28 DIAGNOSIS — J449 Chronic obstructive pulmonary disease, unspecified: Secondary | ICD-10-CM | POA: Diagnosis not present

## 2016-05-28 DIAGNOSIS — G4733 Obstructive sleep apnea (adult) (pediatric): Secondary | ICD-10-CM | POA: Diagnosis not present

## 2016-05-28 DIAGNOSIS — E034 Atrophy of thyroid (acquired): Secondary | ICD-10-CM | POA: Diagnosis not present

## 2016-05-29 DIAGNOSIS — G4733 Obstructive sleep apnea (adult) (pediatric): Secondary | ICD-10-CM | POA: Diagnosis not present

## 2016-05-29 DIAGNOSIS — R32 Unspecified urinary incontinence: Secondary | ICD-10-CM | POA: Diagnosis not present

## 2016-05-29 DIAGNOSIS — J449 Chronic obstructive pulmonary disease, unspecified: Secondary | ICD-10-CM | POA: Diagnosis not present

## 2016-05-29 DIAGNOSIS — E034 Atrophy of thyroid (acquired): Secondary | ICD-10-CM | POA: Diagnosis not present

## 2016-05-29 DIAGNOSIS — I482 Chronic atrial fibrillation: Secondary | ICD-10-CM | POA: Diagnosis not present

## 2016-05-30 DIAGNOSIS — I482 Chronic atrial fibrillation: Secondary | ICD-10-CM | POA: Diagnosis not present

## 2016-05-30 DIAGNOSIS — J449 Chronic obstructive pulmonary disease, unspecified: Secondary | ICD-10-CM | POA: Diagnosis not present

## 2016-05-30 DIAGNOSIS — E034 Atrophy of thyroid (acquired): Secondary | ICD-10-CM | POA: Diagnosis not present

## 2016-05-30 DIAGNOSIS — G4733 Obstructive sleep apnea (adult) (pediatric): Secondary | ICD-10-CM | POA: Diagnosis not present

## 2016-05-31 DIAGNOSIS — G4733 Obstructive sleep apnea (adult) (pediatric): Secondary | ICD-10-CM | POA: Diagnosis not present

## 2016-05-31 DIAGNOSIS — E034 Atrophy of thyroid (acquired): Secondary | ICD-10-CM | POA: Diagnosis not present

## 2016-05-31 DIAGNOSIS — J449 Chronic obstructive pulmonary disease, unspecified: Secondary | ICD-10-CM | POA: Diagnosis not present

## 2016-05-31 DIAGNOSIS — I482 Chronic atrial fibrillation: Secondary | ICD-10-CM | POA: Diagnosis not present

## 2016-06-01 DIAGNOSIS — E034 Atrophy of thyroid (acquired): Secondary | ICD-10-CM | POA: Diagnosis not present

## 2016-06-01 DIAGNOSIS — G4733 Obstructive sleep apnea (adult) (pediatric): Secondary | ICD-10-CM | POA: Diagnosis not present

## 2016-06-01 DIAGNOSIS — I482 Chronic atrial fibrillation: Secondary | ICD-10-CM | POA: Diagnosis not present

## 2016-06-01 DIAGNOSIS — J449 Chronic obstructive pulmonary disease, unspecified: Secondary | ICD-10-CM | POA: Diagnosis not present

## 2016-06-02 DIAGNOSIS — J449 Chronic obstructive pulmonary disease, unspecified: Secondary | ICD-10-CM | POA: Diagnosis not present

## 2016-06-02 DIAGNOSIS — G4733 Obstructive sleep apnea (adult) (pediatric): Secondary | ICD-10-CM | POA: Diagnosis not present

## 2016-06-02 DIAGNOSIS — E034 Atrophy of thyroid (acquired): Secondary | ICD-10-CM | POA: Diagnosis not present

## 2016-06-02 DIAGNOSIS — I482 Chronic atrial fibrillation: Secondary | ICD-10-CM | POA: Diagnosis not present

## 2016-06-03 DIAGNOSIS — I482 Chronic atrial fibrillation: Secondary | ICD-10-CM | POA: Diagnosis not present

## 2016-06-03 DIAGNOSIS — J449 Chronic obstructive pulmonary disease, unspecified: Secondary | ICD-10-CM | POA: Diagnosis not present

## 2016-06-03 DIAGNOSIS — G4733 Obstructive sleep apnea (adult) (pediatric): Secondary | ICD-10-CM | POA: Diagnosis not present

## 2016-06-03 DIAGNOSIS — E034 Atrophy of thyroid (acquired): Secondary | ICD-10-CM | POA: Diagnosis not present

## 2016-06-04 DIAGNOSIS — G4733 Obstructive sleep apnea (adult) (pediatric): Secondary | ICD-10-CM | POA: Diagnosis not present

## 2016-06-04 DIAGNOSIS — J449 Chronic obstructive pulmonary disease, unspecified: Secondary | ICD-10-CM | POA: Diagnosis not present

## 2016-06-04 DIAGNOSIS — E034 Atrophy of thyroid (acquired): Secondary | ICD-10-CM | POA: Diagnosis not present

## 2016-06-04 DIAGNOSIS — I482 Chronic atrial fibrillation: Secondary | ICD-10-CM | POA: Diagnosis not present

## 2016-06-05 DIAGNOSIS — I482 Chronic atrial fibrillation: Secondary | ICD-10-CM | POA: Diagnosis not present

## 2016-06-05 DIAGNOSIS — J449 Chronic obstructive pulmonary disease, unspecified: Secondary | ICD-10-CM | POA: Diagnosis not present

## 2016-06-05 DIAGNOSIS — E034 Atrophy of thyroid (acquired): Secondary | ICD-10-CM | POA: Diagnosis not present

## 2016-06-05 DIAGNOSIS — G4733 Obstructive sleep apnea (adult) (pediatric): Secondary | ICD-10-CM | POA: Diagnosis not present

## 2016-06-06 DIAGNOSIS — G4733 Obstructive sleep apnea (adult) (pediatric): Secondary | ICD-10-CM | POA: Diagnosis not present

## 2016-06-06 DIAGNOSIS — E034 Atrophy of thyroid (acquired): Secondary | ICD-10-CM | POA: Diagnosis not present

## 2016-06-06 DIAGNOSIS — I482 Chronic atrial fibrillation: Secondary | ICD-10-CM | POA: Diagnosis not present

## 2016-06-06 DIAGNOSIS — J449 Chronic obstructive pulmonary disease, unspecified: Secondary | ICD-10-CM | POA: Diagnosis not present

## 2016-06-07 DIAGNOSIS — J449 Chronic obstructive pulmonary disease, unspecified: Secondary | ICD-10-CM | POA: Diagnosis not present

## 2016-06-07 DIAGNOSIS — E034 Atrophy of thyroid (acquired): Secondary | ICD-10-CM | POA: Diagnosis not present

## 2016-06-07 DIAGNOSIS — G4733 Obstructive sleep apnea (adult) (pediatric): Secondary | ICD-10-CM | POA: Diagnosis not present

## 2016-06-07 DIAGNOSIS — I482 Chronic atrial fibrillation: Secondary | ICD-10-CM | POA: Diagnosis not present

## 2016-06-08 DIAGNOSIS — E034 Atrophy of thyroid (acquired): Secondary | ICD-10-CM | POA: Diagnosis not present

## 2016-06-08 DIAGNOSIS — G4733 Obstructive sleep apnea (adult) (pediatric): Secondary | ICD-10-CM | POA: Diagnosis not present

## 2016-06-08 DIAGNOSIS — J449 Chronic obstructive pulmonary disease, unspecified: Secondary | ICD-10-CM | POA: Diagnosis not present

## 2016-06-09 DIAGNOSIS — E034 Atrophy of thyroid (acquired): Secondary | ICD-10-CM | POA: Diagnosis not present

## 2016-06-09 DIAGNOSIS — J449 Chronic obstructive pulmonary disease, unspecified: Secondary | ICD-10-CM | POA: Diagnosis not present

## 2016-06-09 DIAGNOSIS — G4733 Obstructive sleep apnea (adult) (pediatric): Secondary | ICD-10-CM | POA: Diagnosis not present

## 2016-06-10 DIAGNOSIS — E034 Atrophy of thyroid (acquired): Secondary | ICD-10-CM | POA: Diagnosis not present

## 2016-06-10 DIAGNOSIS — J449 Chronic obstructive pulmonary disease, unspecified: Secondary | ICD-10-CM | POA: Diagnosis not present

## 2016-06-10 DIAGNOSIS — G4733 Obstructive sleep apnea (adult) (pediatric): Secondary | ICD-10-CM | POA: Diagnosis not present

## 2016-06-11 DIAGNOSIS — J449 Chronic obstructive pulmonary disease, unspecified: Secondary | ICD-10-CM | POA: Diagnosis not present

## 2016-06-11 DIAGNOSIS — G4733 Obstructive sleep apnea (adult) (pediatric): Secondary | ICD-10-CM | POA: Diagnosis not present

## 2016-06-11 DIAGNOSIS — E034 Atrophy of thyroid (acquired): Secondary | ICD-10-CM | POA: Diagnosis not present

## 2016-06-12 DIAGNOSIS — G4733 Obstructive sleep apnea (adult) (pediatric): Secondary | ICD-10-CM | POA: Diagnosis not present

## 2016-06-12 DIAGNOSIS — E034 Atrophy of thyroid (acquired): Secondary | ICD-10-CM | POA: Diagnosis not present

## 2016-06-12 DIAGNOSIS — J449 Chronic obstructive pulmonary disease, unspecified: Secondary | ICD-10-CM | POA: Diagnosis not present

## 2016-06-13 DIAGNOSIS — G4733 Obstructive sleep apnea (adult) (pediatric): Secondary | ICD-10-CM | POA: Diagnosis not present

## 2016-06-13 DIAGNOSIS — R0902 Hypoxemia: Secondary | ICD-10-CM | POA: Diagnosis not present

## 2016-06-13 DIAGNOSIS — J449 Chronic obstructive pulmonary disease, unspecified: Secondary | ICD-10-CM | POA: Diagnosis not present

## 2016-06-13 DIAGNOSIS — E034 Atrophy of thyroid (acquired): Secondary | ICD-10-CM | POA: Diagnosis not present

## 2016-06-14 DIAGNOSIS — G4733 Obstructive sleep apnea (adult) (pediatric): Secondary | ICD-10-CM | POA: Diagnosis not present

## 2016-06-14 DIAGNOSIS — E034 Atrophy of thyroid (acquired): Secondary | ICD-10-CM | POA: Diagnosis not present

## 2016-06-14 DIAGNOSIS — J449 Chronic obstructive pulmonary disease, unspecified: Secondary | ICD-10-CM | POA: Diagnosis not present

## 2016-06-15 DIAGNOSIS — G4733 Obstructive sleep apnea (adult) (pediatric): Secondary | ICD-10-CM | POA: Diagnosis not present

## 2016-06-15 DIAGNOSIS — E034 Atrophy of thyroid (acquired): Secondary | ICD-10-CM | POA: Diagnosis not present

## 2016-06-15 DIAGNOSIS — J449 Chronic obstructive pulmonary disease, unspecified: Secondary | ICD-10-CM | POA: Diagnosis not present

## 2016-06-16 DIAGNOSIS — E034 Atrophy of thyroid (acquired): Secondary | ICD-10-CM | POA: Diagnosis not present

## 2016-06-16 DIAGNOSIS — J449 Chronic obstructive pulmonary disease, unspecified: Secondary | ICD-10-CM | POA: Diagnosis not present

## 2016-06-16 DIAGNOSIS — G4733 Obstructive sleep apnea (adult) (pediatric): Secondary | ICD-10-CM | POA: Diagnosis not present

## 2016-06-17 DIAGNOSIS — G4733 Obstructive sleep apnea (adult) (pediatric): Secondary | ICD-10-CM | POA: Diagnosis not present

## 2016-06-17 DIAGNOSIS — E034 Atrophy of thyroid (acquired): Secondary | ICD-10-CM | POA: Diagnosis not present

## 2016-06-17 DIAGNOSIS — J449 Chronic obstructive pulmonary disease, unspecified: Secondary | ICD-10-CM | POA: Diagnosis not present

## 2016-06-18 DIAGNOSIS — E034 Atrophy of thyroid (acquired): Secondary | ICD-10-CM | POA: Diagnosis not present

## 2016-06-18 DIAGNOSIS — J449 Chronic obstructive pulmonary disease, unspecified: Secondary | ICD-10-CM | POA: Diagnosis not present

## 2016-06-18 DIAGNOSIS — G4733 Obstructive sleep apnea (adult) (pediatric): Secondary | ICD-10-CM | POA: Diagnosis not present

## 2016-06-19 DIAGNOSIS — J449 Chronic obstructive pulmonary disease, unspecified: Secondary | ICD-10-CM | POA: Diagnosis not present

## 2016-06-19 DIAGNOSIS — G4733 Obstructive sleep apnea (adult) (pediatric): Secondary | ICD-10-CM | POA: Diagnosis not present

## 2016-06-19 DIAGNOSIS — E034 Atrophy of thyroid (acquired): Secondary | ICD-10-CM | POA: Diagnosis not present

## 2016-06-20 DIAGNOSIS — E034 Atrophy of thyroid (acquired): Secondary | ICD-10-CM | POA: Diagnosis not present

## 2016-06-20 DIAGNOSIS — G4733 Obstructive sleep apnea (adult) (pediatric): Secondary | ICD-10-CM | POA: Diagnosis not present

## 2016-06-20 DIAGNOSIS — J449 Chronic obstructive pulmonary disease, unspecified: Secondary | ICD-10-CM | POA: Diagnosis not present

## 2016-06-21 DIAGNOSIS — J449 Chronic obstructive pulmonary disease, unspecified: Secondary | ICD-10-CM | POA: Diagnosis not present

## 2016-06-21 DIAGNOSIS — G4733 Obstructive sleep apnea (adult) (pediatric): Secondary | ICD-10-CM | POA: Diagnosis not present

## 2016-06-21 DIAGNOSIS — E034 Atrophy of thyroid (acquired): Secondary | ICD-10-CM | POA: Diagnosis not present

## 2016-06-22 DIAGNOSIS — J449 Chronic obstructive pulmonary disease, unspecified: Secondary | ICD-10-CM | POA: Diagnosis not present

## 2016-06-22 DIAGNOSIS — E034 Atrophy of thyroid (acquired): Secondary | ICD-10-CM | POA: Diagnosis not present

## 2016-06-22 DIAGNOSIS — G4733 Obstructive sleep apnea (adult) (pediatric): Secondary | ICD-10-CM | POA: Diagnosis not present

## 2016-06-23 DIAGNOSIS — I482 Chronic atrial fibrillation: Secondary | ICD-10-CM | POA: Diagnosis not present

## 2016-06-23 DIAGNOSIS — G4733 Obstructive sleep apnea (adult) (pediatric): Secondary | ICD-10-CM | POA: Diagnosis not present

## 2016-06-23 DIAGNOSIS — E034 Atrophy of thyroid (acquired): Secondary | ICD-10-CM | POA: Diagnosis not present

## 2016-06-24 DIAGNOSIS — E034 Atrophy of thyroid (acquired): Secondary | ICD-10-CM | POA: Diagnosis not present

## 2016-06-24 DIAGNOSIS — G4733 Obstructive sleep apnea (adult) (pediatric): Secondary | ICD-10-CM | POA: Diagnosis not present

## 2016-06-24 DIAGNOSIS — I482 Chronic atrial fibrillation: Secondary | ICD-10-CM | POA: Diagnosis not present

## 2016-06-25 DIAGNOSIS — G4733 Obstructive sleep apnea (adult) (pediatric): Secondary | ICD-10-CM | POA: Diagnosis not present

## 2016-06-25 DIAGNOSIS — E034 Atrophy of thyroid (acquired): Secondary | ICD-10-CM | POA: Diagnosis not present

## 2016-06-25 DIAGNOSIS — I482 Chronic atrial fibrillation: Secondary | ICD-10-CM | POA: Diagnosis not present

## 2016-06-26 DIAGNOSIS — G4733 Obstructive sleep apnea (adult) (pediatric): Secondary | ICD-10-CM | POA: Diagnosis not present

## 2016-06-26 DIAGNOSIS — I482 Chronic atrial fibrillation: Secondary | ICD-10-CM | POA: Diagnosis not present

## 2016-06-26 DIAGNOSIS — E034 Atrophy of thyroid (acquired): Secondary | ICD-10-CM | POA: Diagnosis not present

## 2016-06-27 DIAGNOSIS — G4733 Obstructive sleep apnea (adult) (pediatric): Secondary | ICD-10-CM | POA: Diagnosis not present

## 2016-06-27 DIAGNOSIS — E034 Atrophy of thyroid (acquired): Secondary | ICD-10-CM | POA: Diagnosis not present

## 2016-06-27 DIAGNOSIS — I482 Chronic atrial fibrillation: Secondary | ICD-10-CM | POA: Diagnosis not present

## 2016-06-28 DIAGNOSIS — I482 Chronic atrial fibrillation: Secondary | ICD-10-CM | POA: Diagnosis not present

## 2016-06-28 DIAGNOSIS — G4733 Obstructive sleep apnea (adult) (pediatric): Secondary | ICD-10-CM | POA: Diagnosis not present

## 2016-06-28 DIAGNOSIS — E034 Atrophy of thyroid (acquired): Secondary | ICD-10-CM | POA: Diagnosis not present

## 2016-06-29 DIAGNOSIS — I482 Chronic atrial fibrillation: Secondary | ICD-10-CM | POA: Diagnosis not present

## 2016-06-29 DIAGNOSIS — G4733 Obstructive sleep apnea (adult) (pediatric): Secondary | ICD-10-CM | POA: Diagnosis not present

## 2016-06-29 DIAGNOSIS — E034 Atrophy of thyroid (acquired): Secondary | ICD-10-CM | POA: Diagnosis not present

## 2016-06-30 DIAGNOSIS — E034 Atrophy of thyroid (acquired): Secondary | ICD-10-CM | POA: Diagnosis not present

## 2016-06-30 DIAGNOSIS — I482 Chronic atrial fibrillation: Secondary | ICD-10-CM | POA: Diagnosis not present

## 2016-06-30 DIAGNOSIS — G4733 Obstructive sleep apnea (adult) (pediatric): Secondary | ICD-10-CM | POA: Diagnosis not present

## 2016-07-01 ENCOUNTER — Other Ambulatory Visit: Payer: Self-pay | Admitting: Oncology

## 2016-07-01 DIAGNOSIS — E034 Atrophy of thyroid (acquired): Secondary | ICD-10-CM | POA: Diagnosis not present

## 2016-07-01 DIAGNOSIS — I482 Chronic atrial fibrillation: Secondary | ICD-10-CM | POA: Diagnosis not present

## 2016-07-01 DIAGNOSIS — G4733 Obstructive sleep apnea (adult) (pediatric): Secondary | ICD-10-CM | POA: Diagnosis not present

## 2016-07-02 DIAGNOSIS — G4733 Obstructive sleep apnea (adult) (pediatric): Secondary | ICD-10-CM | POA: Diagnosis not present

## 2016-07-02 DIAGNOSIS — E034 Atrophy of thyroid (acquired): Secondary | ICD-10-CM | POA: Diagnosis not present

## 2016-07-02 DIAGNOSIS — I482 Chronic atrial fibrillation: Secondary | ICD-10-CM | POA: Diagnosis not present

## 2016-07-03 DIAGNOSIS — I482 Chronic atrial fibrillation: Secondary | ICD-10-CM | POA: Diagnosis not present

## 2016-07-03 DIAGNOSIS — R32 Unspecified urinary incontinence: Secondary | ICD-10-CM | POA: Diagnosis not present

## 2016-07-03 DIAGNOSIS — G4733 Obstructive sleep apnea (adult) (pediatric): Secondary | ICD-10-CM | POA: Diagnosis not present

## 2016-07-03 DIAGNOSIS — E034 Atrophy of thyroid (acquired): Secondary | ICD-10-CM | POA: Diagnosis not present

## 2016-07-04 DIAGNOSIS — I482 Chronic atrial fibrillation: Secondary | ICD-10-CM | POA: Diagnosis not present

## 2016-07-04 DIAGNOSIS — G4733 Obstructive sleep apnea (adult) (pediatric): Secondary | ICD-10-CM | POA: Diagnosis not present

## 2016-07-04 DIAGNOSIS — E034 Atrophy of thyroid (acquired): Secondary | ICD-10-CM | POA: Diagnosis not present

## 2016-07-05 DIAGNOSIS — E034 Atrophy of thyroid (acquired): Secondary | ICD-10-CM | POA: Diagnosis not present

## 2016-07-05 DIAGNOSIS — I482 Chronic atrial fibrillation: Secondary | ICD-10-CM | POA: Diagnosis not present

## 2016-07-05 DIAGNOSIS — G4733 Obstructive sleep apnea (adult) (pediatric): Secondary | ICD-10-CM | POA: Diagnosis not present

## 2016-07-06 DIAGNOSIS — I482 Chronic atrial fibrillation: Secondary | ICD-10-CM | POA: Diagnosis not present

## 2016-07-06 DIAGNOSIS — G4733 Obstructive sleep apnea (adult) (pediatric): Secondary | ICD-10-CM | POA: Diagnosis not present

## 2016-07-06 DIAGNOSIS — E034 Atrophy of thyroid (acquired): Secondary | ICD-10-CM | POA: Diagnosis not present

## 2016-07-07 DIAGNOSIS — G4733 Obstructive sleep apnea (adult) (pediatric): Secondary | ICD-10-CM | POA: Diagnosis not present

## 2016-07-07 DIAGNOSIS — E034 Atrophy of thyroid (acquired): Secondary | ICD-10-CM | POA: Diagnosis not present

## 2016-07-07 DIAGNOSIS — I482 Chronic atrial fibrillation: Secondary | ICD-10-CM | POA: Diagnosis not present

## 2016-07-08 DIAGNOSIS — G4733 Obstructive sleep apnea (adult) (pediatric): Secondary | ICD-10-CM | POA: Diagnosis not present

## 2016-07-08 DIAGNOSIS — I482 Chronic atrial fibrillation: Secondary | ICD-10-CM | POA: Diagnosis not present

## 2016-07-08 DIAGNOSIS — E034 Atrophy of thyroid (acquired): Secondary | ICD-10-CM | POA: Diagnosis not present

## 2016-07-09 DIAGNOSIS — E034 Atrophy of thyroid (acquired): Secondary | ICD-10-CM | POA: Diagnosis not present

## 2016-07-09 DIAGNOSIS — G4733 Obstructive sleep apnea (adult) (pediatric): Secondary | ICD-10-CM | POA: Diagnosis not present

## 2016-07-09 DIAGNOSIS — I482 Chronic atrial fibrillation: Secondary | ICD-10-CM | POA: Diagnosis not present

## 2016-07-10 DIAGNOSIS — E034 Atrophy of thyroid (acquired): Secondary | ICD-10-CM | POA: Diagnosis not present

## 2016-07-10 DIAGNOSIS — G4733 Obstructive sleep apnea (adult) (pediatric): Secondary | ICD-10-CM | POA: Diagnosis not present

## 2016-07-10 DIAGNOSIS — I482 Chronic atrial fibrillation: Secondary | ICD-10-CM | POA: Diagnosis not present

## 2016-07-11 DIAGNOSIS — E034 Atrophy of thyroid (acquired): Secondary | ICD-10-CM | POA: Diagnosis not present

## 2016-07-11 DIAGNOSIS — I482 Chronic atrial fibrillation: Secondary | ICD-10-CM | POA: Diagnosis not present

## 2016-07-11 DIAGNOSIS — G4733 Obstructive sleep apnea (adult) (pediatric): Secondary | ICD-10-CM | POA: Diagnosis not present

## 2016-07-12 DIAGNOSIS — I482 Chronic atrial fibrillation: Secondary | ICD-10-CM | POA: Diagnosis not present

## 2016-07-12 DIAGNOSIS — E034 Atrophy of thyroid (acquired): Secondary | ICD-10-CM | POA: Diagnosis not present

## 2016-07-12 DIAGNOSIS — G4733 Obstructive sleep apnea (adult) (pediatric): Secondary | ICD-10-CM | POA: Diagnosis not present

## 2016-07-13 DIAGNOSIS — J449 Chronic obstructive pulmonary disease, unspecified: Secondary | ICD-10-CM | POA: Diagnosis not present

## 2016-07-13 DIAGNOSIS — R0902 Hypoxemia: Secondary | ICD-10-CM | POA: Diagnosis not present

## 2016-07-13 DIAGNOSIS — E034 Atrophy of thyroid (acquired): Secondary | ICD-10-CM | POA: Diagnosis not present

## 2016-07-13 DIAGNOSIS — G4733 Obstructive sleep apnea (adult) (pediatric): Secondary | ICD-10-CM | POA: Diagnosis not present

## 2016-07-14 DIAGNOSIS — E034 Atrophy of thyroid (acquired): Secondary | ICD-10-CM | POA: Diagnosis not present

## 2016-07-14 DIAGNOSIS — G4733 Obstructive sleep apnea (adult) (pediatric): Secondary | ICD-10-CM | POA: Diagnosis not present

## 2016-07-14 DIAGNOSIS — J449 Chronic obstructive pulmonary disease, unspecified: Secondary | ICD-10-CM | POA: Diagnosis not present

## 2016-07-15 DIAGNOSIS — J449 Chronic obstructive pulmonary disease, unspecified: Secondary | ICD-10-CM | POA: Diagnosis not present

## 2016-07-15 DIAGNOSIS — G4733 Obstructive sleep apnea (adult) (pediatric): Secondary | ICD-10-CM | POA: Diagnosis not present

## 2016-07-15 DIAGNOSIS — E034 Atrophy of thyroid (acquired): Secondary | ICD-10-CM | POA: Diagnosis not present

## 2016-07-16 DIAGNOSIS — G4733 Obstructive sleep apnea (adult) (pediatric): Secondary | ICD-10-CM | POA: Diagnosis not present

## 2016-07-16 DIAGNOSIS — E034 Atrophy of thyroid (acquired): Secondary | ICD-10-CM | POA: Diagnosis not present

## 2016-07-16 DIAGNOSIS — J449 Chronic obstructive pulmonary disease, unspecified: Secondary | ICD-10-CM | POA: Diagnosis not present

## 2016-07-17 DIAGNOSIS — E034 Atrophy of thyroid (acquired): Secondary | ICD-10-CM | POA: Diagnosis not present

## 2016-07-17 DIAGNOSIS — G4733 Obstructive sleep apnea (adult) (pediatric): Secondary | ICD-10-CM | POA: Diagnosis not present

## 2016-07-17 DIAGNOSIS — J449 Chronic obstructive pulmonary disease, unspecified: Secondary | ICD-10-CM | POA: Diagnosis not present

## 2016-07-18 DIAGNOSIS — G4733 Obstructive sleep apnea (adult) (pediatric): Secondary | ICD-10-CM | POA: Diagnosis not present

## 2016-07-18 DIAGNOSIS — E034 Atrophy of thyroid (acquired): Secondary | ICD-10-CM | POA: Diagnosis not present

## 2016-07-18 DIAGNOSIS — J449 Chronic obstructive pulmonary disease, unspecified: Secondary | ICD-10-CM | POA: Diagnosis not present

## 2016-07-19 DIAGNOSIS — J449 Chronic obstructive pulmonary disease, unspecified: Secondary | ICD-10-CM | POA: Diagnosis not present

## 2016-07-19 DIAGNOSIS — E034 Atrophy of thyroid (acquired): Secondary | ICD-10-CM | POA: Diagnosis not present

## 2016-07-19 DIAGNOSIS — G4733 Obstructive sleep apnea (adult) (pediatric): Secondary | ICD-10-CM | POA: Diagnosis not present

## 2016-07-20 DIAGNOSIS — G4733 Obstructive sleep apnea (adult) (pediatric): Secondary | ICD-10-CM | POA: Diagnosis not present

## 2016-07-20 DIAGNOSIS — E034 Atrophy of thyroid (acquired): Secondary | ICD-10-CM | POA: Diagnosis not present

## 2016-07-20 DIAGNOSIS — J449 Chronic obstructive pulmonary disease, unspecified: Secondary | ICD-10-CM | POA: Diagnosis not present

## 2016-07-21 DIAGNOSIS — E034 Atrophy of thyroid (acquired): Secondary | ICD-10-CM | POA: Diagnosis not present

## 2016-07-21 DIAGNOSIS — J449 Chronic obstructive pulmonary disease, unspecified: Secondary | ICD-10-CM | POA: Diagnosis not present

## 2016-07-21 DIAGNOSIS — G4733 Obstructive sleep apnea (adult) (pediatric): Secondary | ICD-10-CM | POA: Diagnosis not present

## 2016-07-22 DIAGNOSIS — E034 Atrophy of thyroid (acquired): Secondary | ICD-10-CM | POA: Diagnosis not present

## 2016-07-22 DIAGNOSIS — G4733 Obstructive sleep apnea (adult) (pediatric): Secondary | ICD-10-CM | POA: Diagnosis not present

## 2016-07-22 DIAGNOSIS — J449 Chronic obstructive pulmonary disease, unspecified: Secondary | ICD-10-CM | POA: Diagnosis not present

## 2016-07-23 DIAGNOSIS — E034 Atrophy of thyroid (acquired): Secondary | ICD-10-CM | POA: Diagnosis not present

## 2016-07-23 DIAGNOSIS — J449 Chronic obstructive pulmonary disease, unspecified: Secondary | ICD-10-CM | POA: Diagnosis not present

## 2016-07-23 DIAGNOSIS — G4733 Obstructive sleep apnea (adult) (pediatric): Secondary | ICD-10-CM | POA: Diagnosis not present

## 2016-07-24 DIAGNOSIS — E034 Atrophy of thyroid (acquired): Secondary | ICD-10-CM | POA: Diagnosis not present

## 2016-07-24 DIAGNOSIS — G4733 Obstructive sleep apnea (adult) (pediatric): Secondary | ICD-10-CM | POA: Diagnosis not present

## 2016-07-24 DIAGNOSIS — I482 Chronic atrial fibrillation: Secondary | ICD-10-CM | POA: Diagnosis not present

## 2016-07-25 DIAGNOSIS — G4733 Obstructive sleep apnea (adult) (pediatric): Secondary | ICD-10-CM | POA: Diagnosis not present

## 2016-07-25 DIAGNOSIS — I482 Chronic atrial fibrillation: Secondary | ICD-10-CM | POA: Diagnosis not present

## 2016-07-25 DIAGNOSIS — E034 Atrophy of thyroid (acquired): Secondary | ICD-10-CM | POA: Diagnosis not present

## 2016-07-26 ENCOUNTER — Other Ambulatory Visit: Payer: Self-pay | Admitting: Oncology

## 2016-07-26 DIAGNOSIS — I482 Chronic atrial fibrillation: Secondary | ICD-10-CM | POA: Diagnosis not present

## 2016-07-26 DIAGNOSIS — N390 Urinary tract infection, site not specified: Secondary | ICD-10-CM | POA: Diagnosis not present

## 2016-07-26 DIAGNOSIS — G4733 Obstructive sleep apnea (adult) (pediatric): Secondary | ICD-10-CM | POA: Diagnosis not present

## 2016-07-26 DIAGNOSIS — J449 Chronic obstructive pulmonary disease, unspecified: Secondary | ICD-10-CM | POA: Diagnosis not present

## 2016-07-26 DIAGNOSIS — F0280 Dementia in other diseases classified elsewhere without behavioral disturbance: Secondary | ICD-10-CM | POA: Diagnosis not present

## 2016-07-26 DIAGNOSIS — E034 Atrophy of thyroid (acquired): Secondary | ICD-10-CM | POA: Diagnosis not present

## 2016-07-26 DIAGNOSIS — G301 Alzheimer's disease with late onset: Secondary | ICD-10-CM | POA: Diagnosis not present

## 2016-07-27 DIAGNOSIS — I482 Chronic atrial fibrillation: Secondary | ICD-10-CM | POA: Diagnosis not present

## 2016-07-27 DIAGNOSIS — E034 Atrophy of thyroid (acquired): Secondary | ICD-10-CM | POA: Diagnosis not present

## 2016-07-27 DIAGNOSIS — G4733 Obstructive sleep apnea (adult) (pediatric): Secondary | ICD-10-CM | POA: Diagnosis not present

## 2016-07-28 DIAGNOSIS — G4733 Obstructive sleep apnea (adult) (pediatric): Secondary | ICD-10-CM | POA: Diagnosis not present

## 2016-07-28 DIAGNOSIS — E034 Atrophy of thyroid (acquired): Secondary | ICD-10-CM | POA: Diagnosis not present

## 2016-07-28 DIAGNOSIS — I482 Chronic atrial fibrillation: Secondary | ICD-10-CM | POA: Diagnosis not present

## 2016-07-29 DIAGNOSIS — E034 Atrophy of thyroid (acquired): Secondary | ICD-10-CM | POA: Diagnosis not present

## 2016-07-29 DIAGNOSIS — G4733 Obstructive sleep apnea (adult) (pediatric): Secondary | ICD-10-CM | POA: Diagnosis not present

## 2016-07-29 DIAGNOSIS — I482 Chronic atrial fibrillation: Secondary | ICD-10-CM | POA: Diagnosis not present

## 2016-07-30 DIAGNOSIS — G4733 Obstructive sleep apnea (adult) (pediatric): Secondary | ICD-10-CM | POA: Diagnosis not present

## 2016-07-30 DIAGNOSIS — I482 Chronic atrial fibrillation: Secondary | ICD-10-CM | POA: Diagnosis not present

## 2016-07-30 DIAGNOSIS — E034 Atrophy of thyroid (acquired): Secondary | ICD-10-CM | POA: Diagnosis not present

## 2016-07-31 DIAGNOSIS — G4733 Obstructive sleep apnea (adult) (pediatric): Secondary | ICD-10-CM | POA: Diagnosis not present

## 2016-07-31 DIAGNOSIS — E034 Atrophy of thyroid (acquired): Secondary | ICD-10-CM | POA: Diagnosis not present

## 2016-07-31 DIAGNOSIS — I482 Chronic atrial fibrillation: Secondary | ICD-10-CM | POA: Diagnosis not present

## 2016-08-01 DIAGNOSIS — I482 Chronic atrial fibrillation: Secondary | ICD-10-CM | POA: Diagnosis not present

## 2016-08-01 DIAGNOSIS — E034 Atrophy of thyroid (acquired): Secondary | ICD-10-CM | POA: Diagnosis not present

## 2016-08-01 DIAGNOSIS — G4733 Obstructive sleep apnea (adult) (pediatric): Secondary | ICD-10-CM | POA: Diagnosis not present

## 2016-08-02 DIAGNOSIS — I482 Chronic atrial fibrillation: Secondary | ICD-10-CM | POA: Diagnosis not present

## 2016-08-02 DIAGNOSIS — E034 Atrophy of thyroid (acquired): Secondary | ICD-10-CM | POA: Diagnosis not present

## 2016-08-02 DIAGNOSIS — R32 Unspecified urinary incontinence: Secondary | ICD-10-CM | POA: Diagnosis not present

## 2016-08-02 DIAGNOSIS — G4733 Obstructive sleep apnea (adult) (pediatric): Secondary | ICD-10-CM | POA: Diagnosis not present

## 2016-08-03 DIAGNOSIS — E034 Atrophy of thyroid (acquired): Secondary | ICD-10-CM | POA: Diagnosis not present

## 2016-08-03 DIAGNOSIS — I482 Chronic atrial fibrillation: Secondary | ICD-10-CM | POA: Diagnosis not present

## 2016-08-03 DIAGNOSIS — G4733 Obstructive sleep apnea (adult) (pediatric): Secondary | ICD-10-CM | POA: Diagnosis not present

## 2016-08-04 DIAGNOSIS — G4733 Obstructive sleep apnea (adult) (pediatric): Secondary | ICD-10-CM | POA: Diagnosis not present

## 2016-08-04 DIAGNOSIS — E034 Atrophy of thyroid (acquired): Secondary | ICD-10-CM | POA: Diagnosis not present

## 2016-08-04 DIAGNOSIS — I482 Chronic atrial fibrillation: Secondary | ICD-10-CM | POA: Diagnosis not present

## 2016-08-05 DIAGNOSIS — G4733 Obstructive sleep apnea (adult) (pediatric): Secondary | ICD-10-CM | POA: Diagnosis not present

## 2016-08-05 DIAGNOSIS — R0602 Shortness of breath: Secondary | ICD-10-CM | POA: Diagnosis not present

## 2016-08-05 DIAGNOSIS — J449 Chronic obstructive pulmonary disease, unspecified: Secondary | ICD-10-CM | POA: Diagnosis not present

## 2016-08-05 DIAGNOSIS — Z23 Encounter for immunization: Secondary | ICD-10-CM | POA: Diagnosis not present

## 2016-08-05 DIAGNOSIS — N390 Urinary tract infection, site not specified: Secondary | ICD-10-CM | POA: Diagnosis not present

## 2016-08-05 DIAGNOSIS — I482 Chronic atrial fibrillation: Secondary | ICD-10-CM | POA: Diagnosis not present

## 2016-08-05 DIAGNOSIS — E034 Atrophy of thyroid (acquired): Secondary | ICD-10-CM | POA: Diagnosis not present

## 2016-08-06 DIAGNOSIS — G4733 Obstructive sleep apnea (adult) (pediatric): Secondary | ICD-10-CM | POA: Diagnosis not present

## 2016-08-06 DIAGNOSIS — I482 Chronic atrial fibrillation: Secondary | ICD-10-CM | POA: Diagnosis not present

## 2016-08-06 DIAGNOSIS — E034 Atrophy of thyroid (acquired): Secondary | ICD-10-CM | POA: Diagnosis not present

## 2016-08-07 DIAGNOSIS — I482 Chronic atrial fibrillation: Secondary | ICD-10-CM | POA: Diagnosis not present

## 2016-08-07 DIAGNOSIS — E034 Atrophy of thyroid (acquired): Secondary | ICD-10-CM | POA: Diagnosis not present

## 2016-08-07 DIAGNOSIS — G4733 Obstructive sleep apnea (adult) (pediatric): Secondary | ICD-10-CM | POA: Diagnosis not present

## 2016-08-08 DIAGNOSIS — G4733 Obstructive sleep apnea (adult) (pediatric): Secondary | ICD-10-CM | POA: Diagnosis not present

## 2016-08-08 DIAGNOSIS — I482 Chronic atrial fibrillation: Secondary | ICD-10-CM | POA: Diagnosis not present

## 2016-08-08 DIAGNOSIS — E034 Atrophy of thyroid (acquired): Secondary | ICD-10-CM | POA: Diagnosis not present

## 2016-08-09 DIAGNOSIS — I482 Chronic atrial fibrillation: Secondary | ICD-10-CM | POA: Diagnosis not present

## 2016-08-09 DIAGNOSIS — G4733 Obstructive sleep apnea (adult) (pediatric): Secondary | ICD-10-CM | POA: Diagnosis not present

## 2016-08-09 DIAGNOSIS — R350 Frequency of micturition: Secondary | ICD-10-CM | POA: Diagnosis not present

## 2016-08-09 DIAGNOSIS — E034 Atrophy of thyroid (acquired): Secondary | ICD-10-CM | POA: Diagnosis not present

## 2016-08-10 DIAGNOSIS — I482 Chronic atrial fibrillation: Secondary | ICD-10-CM | POA: Diagnosis not present

## 2016-08-10 DIAGNOSIS — E034 Atrophy of thyroid (acquired): Secondary | ICD-10-CM | POA: Diagnosis not present

## 2016-08-10 DIAGNOSIS — G4733 Obstructive sleep apnea (adult) (pediatric): Secondary | ICD-10-CM | POA: Diagnosis not present

## 2016-08-11 DIAGNOSIS — G4733 Obstructive sleep apnea (adult) (pediatric): Secondary | ICD-10-CM | POA: Diagnosis not present

## 2016-08-11 DIAGNOSIS — I482 Chronic atrial fibrillation: Secondary | ICD-10-CM | POA: Diagnosis not present

## 2016-08-11 DIAGNOSIS — E034 Atrophy of thyroid (acquired): Secondary | ICD-10-CM | POA: Diagnosis not present

## 2016-08-12 DIAGNOSIS — G4733 Obstructive sleep apnea (adult) (pediatric): Secondary | ICD-10-CM | POA: Diagnosis not present

## 2016-08-12 DIAGNOSIS — J449 Chronic obstructive pulmonary disease, unspecified: Secondary | ICD-10-CM | POA: Diagnosis not present

## 2016-08-12 DIAGNOSIS — I482 Chronic atrial fibrillation: Secondary | ICD-10-CM | POA: Diagnosis not present

## 2016-08-13 DIAGNOSIS — R0902 Hypoxemia: Secondary | ICD-10-CM | POA: Diagnosis not present

## 2016-08-13 DIAGNOSIS — J449 Chronic obstructive pulmonary disease, unspecified: Secondary | ICD-10-CM | POA: Diagnosis not present

## 2016-08-13 DIAGNOSIS — G4733 Obstructive sleep apnea (adult) (pediatric): Secondary | ICD-10-CM | POA: Diagnosis not present

## 2016-08-13 DIAGNOSIS — I482 Chronic atrial fibrillation: Secondary | ICD-10-CM | POA: Diagnosis not present

## 2016-08-14 DIAGNOSIS — J449 Chronic obstructive pulmonary disease, unspecified: Secondary | ICD-10-CM | POA: Diagnosis not present

## 2016-08-14 DIAGNOSIS — G4733 Obstructive sleep apnea (adult) (pediatric): Secondary | ICD-10-CM | POA: Diagnosis not present

## 2016-08-14 DIAGNOSIS — I482 Chronic atrial fibrillation: Secondary | ICD-10-CM | POA: Diagnosis not present

## 2016-08-15 DIAGNOSIS — J449 Chronic obstructive pulmonary disease, unspecified: Secondary | ICD-10-CM | POA: Diagnosis not present

## 2016-08-15 DIAGNOSIS — I482 Chronic atrial fibrillation: Secondary | ICD-10-CM | POA: Diagnosis not present

## 2016-08-15 DIAGNOSIS — G4733 Obstructive sleep apnea (adult) (pediatric): Secondary | ICD-10-CM | POA: Diagnosis not present

## 2016-08-16 ENCOUNTER — Emergency Department: Payer: Commercial Managed Care - HMO

## 2016-08-16 ENCOUNTER — Encounter: Payer: Self-pay | Admitting: Emergency Medicine

## 2016-08-16 ENCOUNTER — Emergency Department
Admission: EM | Admit: 2016-08-16 | Discharge: 2016-08-16 | Disposition: A | Discharge status: Home / Self Care | Payer: Commercial Managed Care - HMO | Attending: Emergency Medicine | Admitting: Emergency Medicine

## 2016-08-16 DIAGNOSIS — J449 Chronic obstructive pulmonary disease, unspecified: Secondary | ICD-10-CM | POA: Diagnosis not present

## 2016-08-16 DIAGNOSIS — S51011A Laceration without foreign body of right elbow, initial encounter: Secondary | ICD-10-CM | POA: Insufficient documentation

## 2016-08-16 DIAGNOSIS — R0602 Shortness of breath: Secondary | ICD-10-CM | POA: Diagnosis not present

## 2016-08-16 DIAGNOSIS — E039 Hypothyroidism, unspecified: Secondary | ICD-10-CM | POA: Diagnosis not present

## 2016-08-16 DIAGNOSIS — Y939 Activity, unspecified: Secondary | ICD-10-CM | POA: Insufficient documentation

## 2016-08-16 DIAGNOSIS — G4733 Obstructive sleep apnea (adult) (pediatric): Secondary | ICD-10-CM | POA: Diagnosis not present

## 2016-08-16 DIAGNOSIS — W19XXXA Unspecified fall, initial encounter: Secondary | ICD-10-CM

## 2016-08-16 DIAGNOSIS — Z79899 Other long term (current) drug therapy: Secondary | ICD-10-CM | POA: Diagnosis not present

## 2016-08-16 DIAGNOSIS — Z85850 Personal history of malignant neoplasm of thyroid: Secondary | ICD-10-CM | POA: Diagnosis not present

## 2016-08-16 DIAGNOSIS — G309 Alzheimer's disease, unspecified: Secondary | ICD-10-CM | POA: Insufficient documentation

## 2016-08-16 DIAGNOSIS — Y929 Unspecified place or not applicable: Secondary | ICD-10-CM | POA: Diagnosis not present

## 2016-08-16 DIAGNOSIS — S5001XA Contusion of right elbow, initial encounter: Secondary | ICD-10-CM | POA: Diagnosis not present

## 2016-08-16 DIAGNOSIS — W1809XA Striking against other object with subsequent fall, initial encounter: Secondary | ICD-10-CM | POA: Diagnosis not present

## 2016-08-16 DIAGNOSIS — I509 Heart failure, unspecified: Secondary | ICD-10-CM | POA: Diagnosis not present

## 2016-08-16 DIAGNOSIS — I482 Chronic atrial fibrillation: Secondary | ICD-10-CM | POA: Diagnosis not present

## 2016-08-16 DIAGNOSIS — J189 Pneumonia, unspecified organism: Secondary | ICD-10-CM

## 2016-08-16 DIAGNOSIS — Y999 Unspecified external cause status: Secondary | ICD-10-CM | POA: Diagnosis not present

## 2016-08-16 DIAGNOSIS — Z87891 Personal history of nicotine dependence: Secondary | ICD-10-CM | POA: Insufficient documentation

## 2016-08-16 DIAGNOSIS — T148XXA Other injury of unspecified body region, initial encounter: Secondary | ICD-10-CM

## 2016-08-16 DIAGNOSIS — J181 Lobar pneumonia, unspecified organism: Secondary | ICD-10-CM | POA: Insufficient documentation

## 2016-08-16 DIAGNOSIS — Z7982 Long term (current) use of aspirin: Secondary | ICD-10-CM | POA: Diagnosis not present

## 2016-08-16 DIAGNOSIS — S59901A Unspecified injury of right elbow, initial encounter: Secondary | ICD-10-CM | POA: Diagnosis present

## 2016-08-16 MED ORDER — IPRATROPIUM-ALBUTEROL 0.5-2.5 (3) MG/3ML IN SOLN
3.0000 mL | Freq: Once | RESPIRATORY_TRACT | Status: AC
  Administered 2016-08-16: 3 mL via RESPIRATORY_TRACT
  Filled 2016-08-16: qty 3

## 2016-08-16 MED ORDER — LEVOFLOXACIN 750 MG PO TABS: 750.0000 mg | ORAL_TABLET | Freq: Every day | ORAL | 0 refills | Status: AC

## 2016-08-16 NOTE — ED Notes (Signed)
ED Provider at bedside. 

## 2016-08-16 NOTE — ED Notes (Signed)
Dressing and ace wrap applied to pts arm.

## 2016-08-16 NOTE — ED Notes (Signed)
Pt placed on 2L via [place].  

## 2016-08-16 NOTE — Discharge Instructions (Signed)
As we discussed please discontinue the use of Bactrim, and start Levaquin today for the next 7 days. Please follow-up your primary care doctor this coming week for recheck/reevaluation. Return to the emergency department for any chest pain, trouble breathing, or any other symptom personally concerning to yourself.

## 2016-08-16 NOTE — ED Notes (Signed)
Patient transported to X-ray 

## 2016-08-16 NOTE — ED Provider Notes (Signed)
[facility] Emergency Department Provider Note  Time seen: 3:37 PM  I have reviewed the triage vital signs and the nursing notes.   HISTORY  Chief Complaint Arm Injury    HPI Vanessa Hancock is a 82 y.o. female with a past medical history of atrial fibrillation on Plavix and aspirin, COPD on oxygen as needed, who presents to the emergency department with right elbow pain and swelling. According to the patient she slid out of her bed hitting her right elbow. Denies hitting her head or LOC. Patient has significant swelling to the right elbow with a small skin tear. Patient also reports shortness of breath. Saw her pulmonologist last week for the same started on Combivent. Patient has oxygen at home 2 L as needed.Denies any chest pain. Denies any cough. Does state mild sputum production. Denies fever.  Past Medical History:  Diagnosis Date  . A-fib ([place])   . Alzheimer disease    early stages (2016)  . Cancer ([place])    Thyroid  . CHF (congestive heart failure) ([place])   . COPD (chronic obstructive pulmonary disease) ([place])   . Macular degeneration   . Recurrent thyroid cancer ([place]) 03/28/2015  . Sleep apnea     Patient Active Problem List   Diagnosis Date Noted  . Malignant neoplasm of thyroid gland ([place]) 04/10/2015  . Recurrent thyroid cancer ([place]) 03/28/2015  . Atrial fibrillation, chronic ([place]) 04/07/2014  . Chronic depression 04/07/2014  . CAFL (chronic airflow limitation) ([place]) 04/07/2014  . H/O disease 04/07/2014  . Adult hypothyroidism 04/07/2014  . Obstructive apnea 04/07/2014  . Chronic atrial fibrillation ([place]) 04/07/2014  . Chronic obstructive pulmonary disease ([place]) 04/07/2014    Past Surgical History:  Procedure Laterality Date  . APPENDECTOMY    . THYROIDECTOMY    . TONSILLECTOMY    . vericose vein      Prior to Admission medications   Medication Sig Start Date End Date Taking? Authorizing Provider  acetaminophen (TYLENOL) 325 MG tablet  Take by mouth.    Historical Provider, MD  ADVAIR DISKUS 250-50 MCG/DOSE AEPB Reported on 04/09/2016 03/12/16   Historical Provider, MD  alendronate (FOSAMAX) 70 MG tablet TAKE (1) ONCE A WEEK 30 MINUTES BEFORE BREAKFAST WITH FULL GLASS OF WA TER. DO NOT LIE DOWN AFTER. 08/30/14   Historical Provider, MD  antiseptic oral rinse (BIOTENE) LIQD 15 mLs by Mouth Rinse route 2 (two) times daily. 04/12/15   Forest Gleason, MD  ARIPiprazole (ABILIFY) 2 MG tablet Take by mouth. 10/12/14   Historical Provider, MD  Artificial Saliva (BIOTENE DRY MOUTH MOISTURIZING) SOLN Swish 5-10 ml by mouth and then spit two times a day 07/26/16   Lloyd Huger, MD  Artificial Saliva (BIOTENE MOISTURIZING MOUTH) SOLN SWISH 5-10ML BY MOUTH AND THEN SPIT TWO TIMES A DAY 03/09/15   Forest Gleason, MD  Artificial Saliva (BIOTENE MOISTURIZING MOUTH) SOLN SWISH 5-10ML BY MOUTH AND THEN SPIT TWO TIMES A DAY 09/08/15   Forest Gleason, MD  Artificial Saliva (BIOTENE MOISTURIZING MOUTH) SOLN Swish 5-10 ml by mouth then spit two times a day 03/07/16   Evlyn Kanner, NP  aspirin EC 81 MG tablet TAKE 1 TABLET BY MOUTH ONCE DAILY. 09/09/14   Historical Provider, MD  atorvastatin (LIPITOR) 20 MG tablet TAKE 1 TABLET BY MOUTH ONCE DAILY FOR CHOLESTEROL 09/09/14   Historical Provider, MD  calcitRIOL (ROCALTROL) 0.25 MCG capsule TAKE (1) CAPSULE BY MOUTH TWICE DAILY. 09/09/14   Historical Provider, MD  calcium carbonate (CALCIUM  600) 600 MG TABS tablet TAKE (2) TABLETS BY MOUTH THREE TIMES DAILY. 09/09/14   Historical Provider, MD  cetirizine (ZYRTEC) 10 MG tablet TAKE 1 TABLET BY MOUTH ONCE DAILY. 11/09/14   Historical Provider, MD  clopidogrel (PLAVIX) 75 MG tablet TAKE 1 TABLET BY MOUTH ONCE DAILY. 12/07/14   Historical Provider, MD  digoxin (DIGOX) 0.125 MG tablet TAKE 1 TABLET BY MOUTH ONCE DAILY. 10/12/14   Historical Provider, MD  docusate sodium (COLACE) 100 MG capsule TAKE (1) CAPSULE BY MOUTH TWICE DAILY. 09/09/14   Historical Provider, MD   DULERA 100-5 MCG/ACT AERO Reported on 04/09/2016 02/10/15   Historical Provider, MD  enalapril (VASOTEC) 2.5 MG tablet TAKE (1) TABLET BY MOUTH DAILY FOR HIGH BLOOD PRESSURE. 12/07/14   Historical Provider, MD  escitalopram (LEXAPRO) 20 MG tablet Take by mouth. 11/09/14   Historical Provider, MD  ferrous sulfate 325 (65 FE) MG tablet TAKE 1 TABLET BY MOUTH ONCE DAILY. 11/09/14   Historical Provider, MD  fluticasone (FLONASE) 50 MCG/ACT nasal spray Place into the nose. 01/31/14   Historical Provider, MD  furosemide (LASIX) 20 MG tablet TAKE 1 TABLET BY MOUTH ONCE DAILY. 09/09/14   Historical Provider, MD  ipratropium-albuterol (DUONEB) 0.5-2.5 (3) MG/3ML SOLN Inhale into the lungs. 08/29/14 08/24/15  Historical Provider, MD  levothyroxine (SYNTHROID, LEVOTHROID) 137 MCG tablet TAKE 1 TABLET BY MOUTH ONCE DAILY. 09/09/14   Historical Provider, MD  metoprolol tartrate (LOPRESSOR) 25 MG tablet TAKE 1 TABLET BY MOUTH TWICE DAILY 09/09/14   Historical Provider, MD  Multiple Vitamins-Minerals (I-VITE) TABS TAKE 1 TABLET BY MOUTH ONCE DAILY. 03/01/15   Historical Provider, MD  pantoprazole (PROTONIX) 40 MG tablet TAKE 1 TABLET BY MOUTH ONCE DAILY. 09/09/14   Historical Provider, MD  potassium chloride SA (K-DUR,KLOR-CON) 20 MEQ tablet TAKE 1 TABLET BY MOUTH ONCE DAILY. 11/09/14   Historical Provider, MD  tiotropium (SPIRIVA HANDIHALER) 18 MCG inhalation capsule INHALE 1 CAPSULE AS DIRECTED ONCE DAILY. 10/10/14   Historical Provider, MD    No Known Allergies  No family history on file.  Social History Social History  Substance Use Topics  . Smoking status: Former Research scientist (life sciences)  . Smokeless tobacco: Never Used  . Alcohol use Not on file    Review of Systems Constitutional: Negative for fever. Cardiovascular: Negative for chest pain. Respiratory: Positive for shortness breath over the past 3 weeks. Gastrointestinal: Negative for abdominal pain Neurological: Negative for headache 10-point ROS otherwise  negative.  ____________________________________________   PHYSICAL EXAM:  VITAL SIGNS: ED Triage Vitals  Enc Vitals Group     BP 08/16/16 1420 (!) 147/63     Pulse Rate 08/16/16 1420 85     Resp 08/16/16 1420 (!) 28     Temp 08/16/16 1420 98.4 F (36.9 C)     Temp Source 08/16/16 1420 Oral     SpO2 08/16/16 1420 (!) 88 %     Weight 08/16/16 1421 184 lb (83.5 kg)     Height 08/16/16 1421 5\' 9"  (1.753 m)     Head Circumference --      Peak Flow --      Pain Score 08/16/16 1422 5     Pain Loc --      Pain Edu? --      Excl. in [place]? --     Constitutional: Alert and oriented. Well appearing and in no distress. Eyes: Normal exam ENT   Head: Normocephalic and atraumatic.   Mouth/Throat: Mucous membranes are moist. Cardiovascular: Normal rate, regular rhythm.  No murmur Respiratory: Mild tachypnea. Moderate expiratory wheeze bilaterally. Gastrointestinal: Soft and nontender. No distention.   Musculoskeletal: Nontender with normal range of motion in all extremities. Moderate hematoma to right elbow, small 1.57 m skin tear. Good range of motion. Neurovascularly intact. Neurologic:  Normal speech and language. No gross focal neurologic deficits Skin:  Skin is warm, dry and intact.  Psychiatric: Mood and affect are normal.   ____________________________________________   RADIOLOGY  Chest x-ray shows small area of atelectasis versus pneumonia.  ____________________________________________   INITIAL IMPRESSION / ASSESSMENT AND PLAN / ED COURSE  Pertinent labs & imaging results that were available during my care of the patient were reviewed by me and considered in my medical decision making (see chart for details).  Patient does emergency department with a small skin tear and hematoma to the right elbow, moderate expiratory wheeze. Family states the patient has been wheezing with difficulty breathing over the past 3 weeks, currently seeing her pulmonologist for the same.  Patient does have a moderate sized hematoma to the right elbow with a small skin tear. Good range of motion in the elbow. Neurovascularly intact, 2+ radial pulse. Good grip strength.  X-ray consistent with small area of atelectasis versus pneumonia. Given increased shortness of breath we will cover with Levaquin. Patient also has scattered lung nodules, the family states they are aware of this and it is being followed by her oncologist. Patient is currently finishing a course of Bactrim for a urinary tract infection. Denies any UTI symptoms currently. I discussed with the family discontinuing the Bactrim and starting the Levaquin today. Patient and family are agreeable.  ____________________________________________   FINAL CLINICAL IMPRESSION(S) / ED DIAGNOSES  Hematoma Fall Dyspnea Pneumonia    Harvest Dark, MD 08/16/16 [phone]

## 2016-08-16 NOTE — ED Triage Notes (Signed)
Pt reports sliding off end of bed and falling on right elbow. Pt with large hematoma and small amount of bleeding noted. Pt reports she's on plavix. Some auditory wheezing noted, pt and family member report this is patient's normal.

## 2016-08-17 DIAGNOSIS — G4733 Obstructive sleep apnea (adult) (pediatric): Secondary | ICD-10-CM | POA: Diagnosis not present

## 2016-08-17 DIAGNOSIS — J449 Chronic obstructive pulmonary disease, unspecified: Secondary | ICD-10-CM | POA: Diagnosis not present

## 2016-08-17 DIAGNOSIS — I482 Chronic atrial fibrillation: Secondary | ICD-10-CM | POA: Diagnosis not present

## 2016-08-18 DIAGNOSIS — J449 Chronic obstructive pulmonary disease, unspecified: Secondary | ICD-10-CM | POA: Diagnosis not present

## 2016-08-18 DIAGNOSIS — I482 Chronic atrial fibrillation: Secondary | ICD-10-CM | POA: Diagnosis not present

## 2016-08-18 DIAGNOSIS — G4733 Obstructive sleep apnea (adult) (pediatric): Secondary | ICD-10-CM | POA: Diagnosis not present

## 2016-08-19 DIAGNOSIS — G4733 Obstructive sleep apnea (adult) (pediatric): Secondary | ICD-10-CM | POA: Diagnosis not present

## 2016-08-19 DIAGNOSIS — I482 Chronic atrial fibrillation: Secondary | ICD-10-CM | POA: Diagnosis not present

## 2016-08-19 DIAGNOSIS — J449 Chronic obstructive pulmonary disease, unspecified: Secondary | ICD-10-CM | POA: Diagnosis not present

## 2016-08-20 DIAGNOSIS — J449 Chronic obstructive pulmonary disease, unspecified: Secondary | ICD-10-CM | POA: Diagnosis not present

## 2016-08-20 DIAGNOSIS — I482 Chronic atrial fibrillation: Secondary | ICD-10-CM | POA: Diagnosis not present

## 2016-08-20 DIAGNOSIS — J849 Interstitial pulmonary disease, unspecified: Secondary | ICD-10-CM | POA: Diagnosis not present

## 2016-08-20 DIAGNOSIS — S27321D Contusion of lung, unilateral, subsequent encounter: Secondary | ICD-10-CM | POA: Diagnosis not present

## 2016-08-20 DIAGNOSIS — G4733 Obstructive sleep apnea (adult) (pediatric): Secondary | ICD-10-CM | POA: Diagnosis not present

## 2016-08-20 DIAGNOSIS — J189 Pneumonia, unspecified organism: Secondary | ICD-10-CM | POA: Diagnosis not present

## 2016-08-21 DIAGNOSIS — J449 Chronic obstructive pulmonary disease, unspecified: Secondary | ICD-10-CM | POA: Diagnosis not present

## 2016-08-21 DIAGNOSIS — G4733 Obstructive sleep apnea (adult) (pediatric): Secondary | ICD-10-CM | POA: Diagnosis not present

## 2016-08-21 DIAGNOSIS — I482 Chronic atrial fibrillation: Secondary | ICD-10-CM | POA: Diagnosis not present

## 2016-08-22 DIAGNOSIS — J449 Chronic obstructive pulmonary disease, unspecified: Secondary | ICD-10-CM | POA: Diagnosis not present

## 2016-08-22 DIAGNOSIS — I482 Chronic atrial fibrillation: Secondary | ICD-10-CM | POA: Diagnosis not present

## 2016-08-22 DIAGNOSIS — G4733 Obstructive sleep apnea (adult) (pediatric): Secondary | ICD-10-CM | POA: Diagnosis not present

## 2016-08-23 DIAGNOSIS — I482 Chronic atrial fibrillation: Secondary | ICD-10-CM | POA: Diagnosis not present

## 2016-08-23 DIAGNOSIS — G4733 Obstructive sleep apnea (adult) (pediatric): Secondary | ICD-10-CM | POA: Diagnosis not present

## 2016-08-23 DIAGNOSIS — J449 Chronic obstructive pulmonary disease, unspecified: Secondary | ICD-10-CM | POA: Diagnosis not present

## 2016-08-24 DIAGNOSIS — J449 Chronic obstructive pulmonary disease, unspecified: Secondary | ICD-10-CM | POA: Diagnosis not present

## 2016-08-24 DIAGNOSIS — I482 Chronic atrial fibrillation: Secondary | ICD-10-CM | POA: Diagnosis not present

## 2016-08-24 DIAGNOSIS — G4733 Obstructive sleep apnea (adult) (pediatric): Secondary | ICD-10-CM | POA: Diagnosis not present

## 2016-08-25 ENCOUNTER — Emergency Department
Admission: EM | Admit: 2016-08-25 | Discharge: 2016-08-26 | Payer: Commercial Managed Care - HMO | Attending: Emergency Medicine | Admitting: Emergency Medicine

## 2016-08-25 ENCOUNTER — Emergency Department: Payer: Commercial Managed Care - HMO

## 2016-08-25 ENCOUNTER — Encounter: Payer: Self-pay | Admitting: Emergency Medicine

## 2016-08-25 DIAGNOSIS — Y929 Unspecified place or not applicable: Secondary | ICD-10-CM | POA: Insufficient documentation

## 2016-08-25 DIAGNOSIS — Z7982 Long term (current) use of aspirin: Secondary | ICD-10-CM | POA: Diagnosis not present

## 2016-08-25 DIAGNOSIS — R410 Disorientation, unspecified: Secondary | ICD-10-CM | POA: Insufficient documentation

## 2016-08-25 DIAGNOSIS — Y939 Activity, unspecified: Secondary | ICD-10-CM | POA: Insufficient documentation

## 2016-08-25 DIAGNOSIS — R062 Wheezing: Secondary | ICD-10-CM | POA: Diagnosis not present

## 2016-08-25 DIAGNOSIS — R0602 Shortness of breath: Secondary | ICD-10-CM | POA: Diagnosis not present

## 2016-08-25 DIAGNOSIS — Z79899 Other long term (current) drug therapy: Secondary | ICD-10-CM | POA: Insufficient documentation

## 2016-08-25 DIAGNOSIS — G309 Alzheimer's disease, unspecified: Secondary | ICD-10-CM | POA: Diagnosis not present

## 2016-08-25 DIAGNOSIS — E039 Hypothyroidism, unspecified: Secondary | ICD-10-CM | POA: Insufficient documentation

## 2016-08-25 DIAGNOSIS — S225XXA Flail chest, initial encounter for closed fracture: Secondary | ICD-10-CM | POA: Diagnosis not present

## 2016-08-25 DIAGNOSIS — Z85850 Personal history of malignant neoplasm of thyroid: Secondary | ICD-10-CM | POA: Diagnosis not present

## 2016-08-25 DIAGNOSIS — J449 Chronic obstructive pulmonary disease, unspecified: Secondary | ICD-10-CM | POA: Diagnosis not present

## 2016-08-25 DIAGNOSIS — I509 Heart failure, unspecified: Secondary | ICD-10-CM | POA: Insufficient documentation

## 2016-08-25 DIAGNOSIS — W19XXXA Unspecified fall, initial encounter: Secondary | ICD-10-CM | POA: Diagnosis not present

## 2016-08-25 DIAGNOSIS — S271XXA Traumatic hemothorax, initial encounter: Secondary | ICD-10-CM | POA: Diagnosis not present

## 2016-08-25 DIAGNOSIS — Z87891 Personal history of nicotine dependence: Secondary | ICD-10-CM | POA: Insufficient documentation

## 2016-08-25 DIAGNOSIS — Y999 Unspecified external cause status: Secondary | ICD-10-CM | POA: Insufficient documentation

## 2016-08-25 DIAGNOSIS — I482 Chronic atrial fibrillation: Secondary | ICD-10-CM | POA: Diagnosis not present

## 2016-08-25 DIAGNOSIS — J942 Hemothorax: Secondary | ICD-10-CM | POA: Insufficient documentation

## 2016-08-25 DIAGNOSIS — S299XXA Unspecified injury of thorax, initial encounter: Secondary | ICD-10-CM | POA: Diagnosis present

## 2016-08-25 DIAGNOSIS — S2241XA Multiple fractures of ribs, right side, initial encounter for closed fracture: Secondary | ICD-10-CM | POA: Diagnosis not present

## 2016-08-25 DIAGNOSIS — R531 Weakness: Secondary | ICD-10-CM | POA: Diagnosis not present

## 2016-08-25 DIAGNOSIS — G4733 Obstructive sleep apnea (adult) (pediatric): Secondary | ICD-10-CM | POA: Diagnosis not present

## 2016-08-25 LAB — BASIC METABOLIC PANEL
ANION GAP: 10 (ref 5–15)
BUN: 50 mg/dL — AB (ref 6–20)
CALCIUM: 11 mg/dL — AB (ref 8.9–10.3)
CO2: 30 mmol/L (ref 22–32)
Chloride: 96 mmol/L — ABNORMAL LOW (ref 101–111)
Creatinine, Ser: 1.45 mg/dL — ABNORMAL HIGH (ref 0.44–1.00)
GFR calc Af Amer: 38 mL/min — ABNORMAL LOW (ref >60)
GFR calc non Af Amer: 33 mL/min — ABNORMAL LOW (ref >60)
GLUCOSE: 222 mg/dL — AB (ref 65–99)
Potassium: 3.9 mmol/L (ref 3.5–5.1)
Sodium: 136 mmol/L (ref 135–145)

## 2016-08-25 LAB — CBC WITH DIFFERENTIAL/PLATELET
BASOS ABS: 0.1 10*3/uL (ref 0–0.1)
Basophils Relative: 1 %
Eosinophils Absolute: 0 10*3/uL (ref 0–0.7)
Eosinophils Relative: 0 %
HEMATOCRIT: 31.2 % — AB (ref 35.0–47.0)
Hemoglobin: 10.5 g/dL — ABNORMAL LOW (ref 12.0–16.0)
LYMPHS ABS: 1.1 10*3/uL (ref 1.0–3.6)
LYMPHS PCT: 11 %
MCH: 27 pg (ref 26.0–34.0)
MCHC: 33.8 g/dL (ref 32.0–36.0)
MCV: 80 fL (ref 80.0–100.0)
MONO ABS: 0.6 10*3/uL (ref 0.2–0.9)
MONOS PCT: 7 %
NEUTROS ABS: 7.8 10*3/uL — AB (ref 1.4–6.5)
Neutrophils Relative %: 81 %
Platelets: 325 10*3/uL (ref 150–440)
RBC: 3.9 MIL/uL (ref 3.80–5.20)
RDW: 17.5 % — AB (ref 11.5–14.5)
WBC: 9.6 10*3/uL (ref 3.6–11.0)

## 2016-08-25 LAB — INFLUENZA PANEL BY PCR (TYPE A & B)
Influenza A By PCR: NEGATIVE
Influenza B By PCR: NEGATIVE

## 2016-08-25 MED ORDER — DEXTROSE 5 % IV SOLN: 1.0000 g | Freq: Once | INTRAVENOUS | Status: DC

## 2016-08-25 MED ORDER — CEFTRIAXONE SODIUM-DEXTROSE 1-3.74 GM-% IV SOLR
1.0000 g | Freq: Once | INTRAVENOUS | Status: AC
  Administered 2016-08-26: 1 g via INTRAVENOUS

## 2016-08-25 MED ORDER — METHYLPREDNISOLONE SODIUM SUCC 125 MG IJ SOLR
125.0000 mg | Freq: Once | INTRAMUSCULAR | Status: AC
  Administered 2016-08-25: 125 mg via INTRAVENOUS
  Filled 2016-08-25: qty 2

## 2016-08-25 MED ORDER — DEXTROSE 5 % IV SOLN: 500.0000 mg | Freq: Once | INTRAVENOUS | Status: DC

## 2016-08-25 MED ORDER — SODIUM CHLORIDE 0.9 % IV BOLUS (SEPSIS)
500.0000 mL | Freq: Once | INTRAVENOUS | Status: AC
  Administered 2016-08-26: 500 mL via INTRAVENOUS

## 2016-08-25 MED ORDER — IPRATROPIUM-ALBUTEROL 0.5-2.5 (3) MG/3ML IN SOLN
3.0000 mL | Freq: Once | RESPIRATORY_TRACT | Status: AC
  Administered 2016-08-25: 3 mL via RESPIRATORY_TRACT
  Filled 2016-08-25: qty 3

## 2016-08-25 NOTE — ED Notes (Signed)
Pt lives [facility] [place].

## 2016-08-25 NOTE — ED Provider Notes (Signed)
[facility] Emergency Department Provider Note  ____________________________________________  Time seen: Approximately 9:23 PM  I have reviewed the triage vital signs and the nursing notes.   HISTORY  Chief Complaint Altered Mental Status  Level 5 caveat:  Portions of the history and physical were unable to be obtained due to AMS   HPI Vanessa Hancock is a 82 y.o. female the history of Alzheimer's, atrial fibrillation, on Plavix, CHF, COPD, sleep apnea on CPAP, thyroid cancer who presents for evaluation of altered mental status. According to the daughter, patient had a fall after Thanksgiving. She was seen here and diagnosed with pneumonia. She was put on a 7 day course of Levaquin which she finished 2 days ago. Patient sustained bruising to her right chest wall and right hip. She has been able to walk without difficulty. According to the daughter patient was weak but was doing well otherwise. Today facility noticed that patient was confused, picking up things in the air which prompted them to call the family. Patient has also had wheezing and coughing. She has been on 2 L nasal cannula since the fall. No fever or chills, no nausea or vomiting, she is eating normally, no abdominal pain, no diarrhea, no chest pain. Patient does endorse shortness of breath but is unable to tell me for how long. She has audible wheezing in the room.  Past Medical History:  Diagnosis Date  . A-fib ([place])   . Alzheimer disease    early stages (2016)  . Cancer ([place])    Thyroid  . CHF (congestive heart failure) ([place])   . COPD (chronic obstructive pulmonary disease) ([place])   . Macular degeneration   . Recurrent thyroid cancer ([place]) 03/28/2015  . Sleep apnea     Patient Active Problem List   Diagnosis Date Noted  . Malignant neoplasm of thyroid gland ([place]) 04/10/2015  . Recurrent thyroid cancer ([place]) 03/28/2015  . Atrial fibrillation, chronic ([place]) 04/07/2014  . Chronic depression  04/07/2014  . CAFL (chronic airflow limitation) ([place]) 04/07/2014  . H/O disease 04/07/2014  . Adult hypothyroidism 04/07/2014  . Obstructive apnea 04/07/2014  . Chronic atrial fibrillation ([place]) 04/07/2014  . Chronic obstructive pulmonary disease ([place]) 04/07/2014    Past Surgical History:  Procedure Laterality Date  . APPENDECTOMY    . THYROIDECTOMY    . TONSILLECTOMY    . vericose vein      Prior to Admission medications   Medication Sig Start Date End Date Taking? Authorizing Provider  acetaminophen (TYLENOL) 325 MG tablet Take 325 mg by mouth every 6 (six) hours as needed for mild pain, moderate pain or fever.    Yes Historical Provider, MD  ADVAIR DISKUS 250-50 MCG/DOSE AEPB Inhale 1 puff into the lungs every 12 (twelve) hours. Reported on 04/09/2016   Yes Historical Provider, MD  alendronate (FOSAMAX) 70 MG tablet TAKE (1) ONCE A WEEK 30 MINUTES BEFORE BREAKFAST WITH FULL GLASS OF WA TER. DO NOT LIE DOWN AFTER. 08/30/14  Yes Historical Provider, MD  ARIPiprazole (ABILIFY) 2 MG tablet Take 2 mg by mouth daily.    Yes Historical Provider, MD  Artificial Saliva (BIOTENE MOISTURIZING MOUTH) SOLN Swish 5-10 ml by mouth then spit two times a day 03/07/16  Yes Evlyn Kanner, NP  aspirin EC 81 MG tablet TAKE 1 TABLET BY MOUTH ONCE DAILY. 09/09/14  Yes Historical Provider, MD  atorvastatin (LIPITOR) 20 MG tablet TAKE 1 TABLET BY MOUTH ONCE DAILY FOR CHOLESTEROL 09/09/14  Yes Historical Provider,  MD  calcitRIOL (ROCALTROL) 0.25 MCG capsule TAKE (1) CAPSULE BY MOUTH TWICE DAILY. 09/09/14  Yes Historical Provider, MD  calcium carbonate (CALCIUM 600) 600 MG TABS tablet TAKE (2) TABLETS BY MOUTH THREE TIMES DAILY. 09/09/14  Yes Historical Provider, MD  cetirizine (ZYRTEC) 10 MG tablet TAKE 1 TABLET BY MOUTH ONCE DAILY. 11/09/14  Yes Historical Provider, MD  clopidogrel (PLAVIX) 75 MG tablet TAKE 1 TABLET BY MOUTH ONCE DAILY. 12/07/14  Yes Historical Provider, MD  digoxin (DIGOX) 0.125 MG tablet  TAKE 1 TABLET BY MOUTH ONCE DAILY. 10/12/14  Yes Historical Provider, MD  docusate sodium (COLACE) 100 MG capsule TAKE (1) CAPSULE BY MOUTH TWICE DAILY. 09/09/14  Yes Historical Provider, MD  enalapril (VASOTEC) 2.5 MG tablet TAKE (1) TABLET BY MOUTH DAILY FOR HIGH BLOOD PRESSURE. 12/07/14  Yes Historical Provider, MD  escitalopram (LEXAPRO) 20 MG tablet Take 20 mg by mouth daily.    Yes Historical Provider, MD  ferrous sulfate 325 (65 FE) MG tablet TAKE 1 TABLET BY MOUTH ONCE DAILY. 11/09/14  Yes Historical Provider, MD  fluticasone (FLONASE) 50 MCG/ACT nasal spray Place into the nose. 01/31/14  Yes Historical Provider, MD  furosemide (LASIX) 20 MG tablet Take 20 mg by mouth 2 (two) times daily.   Yes Historical Provider, MD  ipratropium-albuterol (DUONEB) 0.5-2.5 (3) MG/3ML SOLN Take 3 mLs by nebulization every 4 (four) hours as needed (wheezing).   Yes Historical Provider, MD  metoprolol tartrate (LOPRESSOR) 25 MG tablet TAKE 1 TABLET BY MOUTH TWICE DAILY 09/09/14  Yes Historical Provider, MD  Multiple Vitamins-Minerals (I-VITE) TABS TAKE 1 TABLET BY MOUTH ONCE DAILY. 03/01/15  Yes Historical Provider, MD  pantoprazole (PROTONIX) 40 MG tablet TAKE 1 TABLET BY MOUTH ONCE DAILY. 09/09/14  Yes Historical Provider, MD  potassium chloride SA (K-DUR,KLOR-CON) 20 MEQ tablet TAKE 1 TABLET BY MOUTH ONCE DAILY. 11/09/14  Yes Historical Provider, MD  predniSONE (DELTASONE) 10 MG tablet Take 10 mg by mouth daily with breakfast. 40mg  x 3, 30mg  x 3, 20mg  x 3, 10mg  x 3   Yes Historical Provider, MD  tiotropium (SPIRIVA HANDIHALER) 18 MCG inhalation capsule INHALE 1 CAPSULE AS DIRECTED ONCE DAILY. 10/10/14  Yes Historical Provider, MD  DULERA 100-5 MCG/ACT AERO Reported on 04/09/2016 02/10/15   Historical Provider, MD  ipratropium-albuterol (DUONEB) 0.5-2.5 (3) MG/3ML SOLN Inhale into the lungs. 08/29/14 08/24/15  Historical Provider, MD  levothyroxine (SYNTHROID, LEVOTHROID) 137 MCG tablet TAKE 1 TABLET BY MOUTH ONCE DAILY.  09/09/14   Historical Provider, MD    Allergies Patient has no known allergies.  History reviewed. No pertinent family history.  Social History Social History  Substance Use Topics  . Smoking status: Former Research scientist (life sciences)  . Smokeless tobacco: Never Used  . Alcohol use No    Review of Systems  Constitutional: Negative for fever. + confusion Eyes: Negative for visual changes. ENT: Negative for sore throat. Neck: No neck pain  Cardiovascular: Negative for chest pain. Respiratory: + shortness of breath, wheezing Gastrointestinal: Negative for abdominal pain, vomiting or diarrhea. Genitourinary: Negative for dysuria. Musculoskeletal: Negative for back pain. Skin: Negative for rash. Neurological: Negative for headaches, weakness or numbness. Psych: No SI or HI  ____________________________________________   PHYSICAL EXAM:  VITAL SIGNS: ED Triage Vitals  Enc Vitals Group     BP 08/25/16 2045 (!) 101/57     Pulse Rate 08/25/16 2045 94     Resp 08/25/16 2045 (!) 22     Temp 08/25/16 2045 98 F (36.7 C)  Temp src --      SpO2 08/25/16 2045 93 %     Weight 08/25/16 2059 184 lb (83.5 kg)     Height 08/25/16 2059 5\' 9"  (1.753 m)     Head Circumference --      Peak Flow --      Pain Score 08/25/16 2059 0     Pain Loc --      Pain Edu? --      Excl. in [place]? --     Constitutional: Alert and oriented x 2, mild/ moderate respiratory distress.  HEENT:      Head: Normocephalic and atraumatic.         Eyes: Conjunctivae are normal. Sclera is non-icteric. EOMI. PERRL      Mouth/Throat: Mucous membranes are dry.       Neck: Supple with no signs of meningismus. Cardiovascular: Regular rate and rhythm. No murmurs, gallops, or rubs. 2+ symmetrical distal pulses are present in all extremities. No JVD. Respiratory: Tachypnea To the mid 20s, satting 92-94% on 2 L nasal cannula, diffuse expiratory wheezes, large hematoma on the R chest wall  Gastrointestinal: Soft, non tender, and non  distended with positive bowel sounds. No rebound or guarding. Musculoskeletal: Large bruises on the R elbow, RUE, R hip area Neurologic:  Face is symmetric. Moving all extremities.  Skin: Skin is warm, dry and intact. No rash noted. Psychiatric: Mood and affect are normal. Speech and behavior are normal.  ____________________________________________   LABS (all labs ordered are listed, but only abnormal results are displayed)  Labs Reviewed  CBC WITH DIFFERENTIAL/PLATELET - Abnormal; Notable for the following:       Result Value   Hemoglobin 10.5 (*)    HCT 31.2 (*)    RDW 17.5 (*)    Neutro Abs 7.8 (*)    All other components within normal limits  BASIC METABOLIC PANEL - Abnormal; Notable for the following:    Chloride 96 (*)    Glucose, Bld 222 (*)    BUN 50 (*)    Creatinine, Ser 1.45 (*)    Calcium 11.0 (*)    GFR calc non Af Amer 33 (*)    GFR calc Af Amer 38 (*)    All other components within normal limits  URINE CULTURE  INFLUENZA PANEL BY PCR (TYPE A & B, H1N1)  URINALYSIS COMPLETEWITH MICROSCOPIC ([facility] ONLY)  PROTIME-INR  APTT   ____________________________________________  EKG  ED ECG REPORT I, Rudene Re, the attending physician, personally viewed and interpreted this ECG.  Atrial fibrillation, poor quality EKG due to patient's tremor, rate of 94, normal QRS and QTc intervals, normal axis, no ST elevations or depressions. ____________________________________________  RADIOLOGY  CT chest: 1. Significantly displaced fracture of the right lateral second through ninth ribs, and of the right posterior third through ninth ribs, with surrounding soft tissue hemorrhage and scattered soft tissue air tracking about the right axilla and right posterior chest wall. Fractures of the right third through ninth ribs in 2 locations corresponds to concern for flail chest. 2. Moderate right-sided pleural effusion. This may reflect serosanguineous fluid or  hemothorax. Partial consolidation of the right lower lobe. Underlying pneumonia cannot be entirely excluded. 3. Diffuse coronary artery calcifications seen. Mild cardiomegaly. 4. Small pericardial effusion noted. 5. Mild tracheobronchomalacia noted. 6. Numerous peripheral lung nodules noted bilaterally, measuring up to 1.1 cm in size. Metastatic disease cannot be excluded. Additional spiculated 1.7 cm nodule at the posterior aspect of the left upper lobe,  concerning for malignancy. PET/CT could be considered for further evaluation, as deemed clinically appropriate. 7. Small left renal cyst noted. 8. Scattered bilateral adrenal adenomas noted. ____________________________________________   PROCEDURES  Procedure(s) performed: None Procedures Critical Care performed: yes  CRITICAL CARE Performed by: Rudene Re  ?  Total critical care time: 40 min  Critical care time was exclusive of separately billable procedures and treating other patients.  Critical care was necessary to treat or prevent imminent or life-threatening deterioration.  Critical care was time spent personally by me on the following activities: development of treatment plan with patient and/or surrogate as well as nursing, discussions with consultants, evaluation of patient's response to treatment, examination of patient, obtaining history from patient or surrogate, ordering and performing treatments and interventions, ordering and review of laboratory studies, ordering and review of radiographic studies, pulse oximetry and re-evaluation of patient's condition.  ____________________________________________   INITIAL IMPRESSION / ASSESSMENT AND PLAN / ED COURSE  82 y.o. female the history of Alzheimer's, atrial fibrillation on the [place], CHF, COPD, sleep apnea on CPAP, thyroid cancer who presents for evaluation of altered mental status since earlier today. Patient with an obvious COPD exacerbation also  possibly concerning for pneumonia since patient has been splinting since sustaining a recent fall. We'll give DuoNeb, Solu-Medrol, check chest x-ray, flu, basic labs, urinalysis.  Clinical Course    Patient found to have flail chest with moderate right-sided pleural effusion. I spoke with the radiologist who was concerned that this hemothorax is subacute and possibly clotted. I had a long long discussion with family at the bedside about risks and benefits of putting a chest tube here versus transferring her and having the chest tube placed at [facility]. My concern is that if the chest tube is placed here we might not get any blood return due to clotted hemothorax and also patient has a chance of bleeding since she is on Plavix and I do not have backup trauma surgery here to intervene. Therefore we made a decision to transfer patient at this time without a chest tube. Patient's current vital signs are heart rate of 90, blood pressure of 136/111, satting 93% on 2 L nasal cannula. Patient received the ceftriaxone and azithromycin, and 1L IV fluids. [facility] sending a helicopter to transfer patient to the ED for trauma evaluation.  Pertinent labs & imaging results that were available during my care of the patient were reviewed by me and considered in my medical decision making (see chart for details).    ____________________________________________   FINAL CLINICAL IMPRESSION(S) / ED DIAGNOSES  Final diagnoses:  Closed fracture of multiple ribs with flail chest, initial encounter  Hemothorax on right      NEW MEDICATIONS STARTED DURING THIS VISIT:  New Prescriptions   No medications on file     Note:  This document was prepared using Dragon voice recognition software and may include unintentional dictation errors.    Rudene Re, MD 08/26/16 [phone]

## 2016-08-25 NOTE — ED Triage Notes (Signed)
Per family pt is not acting her normal self and seems to be "seeing things". Pt finished her levaquin on 08/23/16 which was for possible pneumonia/uti. Pt given her evening albuterol tx by ems. bs after eating was 236. Pt in afib and takes digoxin for this condition.

## 2016-08-26 DIAGNOSIS — M5136 Other intervertebral disc degeneration, lumbar region: Secondary | ICD-10-CM | POA: Diagnosis not present

## 2016-08-26 DIAGNOSIS — S271XXA Traumatic hemothorax, initial encounter: Secondary | ICD-10-CM | POA: Diagnosis not present

## 2016-08-26 DIAGNOSIS — S3992XA Unspecified injury of lower back, initial encounter: Secondary | ICD-10-CM | POA: Diagnosis not present

## 2016-08-26 DIAGNOSIS — S225XXA Flail chest, initial encounter for closed fracture: Secondary | ICD-10-CM | POA: Diagnosis not present

## 2016-08-26 DIAGNOSIS — M7989 Other specified soft tissue disorders: Secondary | ICD-10-CM | POA: Diagnosis not present

## 2016-08-26 DIAGNOSIS — J9600 Acute respiratory failure, unspecified whether with hypoxia or hypercapnia: Secondary | ICD-10-CM | POA: Diagnosis not present

## 2016-08-26 DIAGNOSIS — K5790 Diverticulosis of intestine, part unspecified, without perforation or abscess without bleeding: Secondary | ICD-10-CM | POA: Diagnosis not present

## 2016-08-26 DIAGNOSIS — R911 Solitary pulmonary nodule: Secondary | ICD-10-CM | POA: Diagnosis not present

## 2016-08-26 DIAGNOSIS — S270XXA Traumatic pneumothorax, initial encounter: Secondary | ICD-10-CM | POA: Diagnosis not present

## 2016-08-26 DIAGNOSIS — J942 Hemothorax: Secondary | ICD-10-CM | POA: Diagnosis not present

## 2016-08-26 DIAGNOSIS — Z85850 Personal history of malignant neoplasm of thyroid: Secondary | ICD-10-CM | POA: Diagnosis not present

## 2016-08-26 DIAGNOSIS — R262 Difficulty in walking, not elsewhere classified: Secondary | ICD-10-CM | POA: Diagnosis not present

## 2016-08-26 DIAGNOSIS — J449 Chronic obstructive pulmonary disease, unspecified: Secondary | ICD-10-CM | POA: Diagnosis not present

## 2016-08-26 DIAGNOSIS — S272XXS Traumatic hemopneumothorax, sequela: Secondary | ICD-10-CM | POA: Diagnosis not present

## 2016-08-26 DIAGNOSIS — E279 Disorder of adrenal gland, unspecified: Secondary | ICD-10-CM | POA: Diagnosis not present

## 2016-08-26 DIAGNOSIS — I959 Hypotension, unspecified: Secondary | ICD-10-CM | POA: Diagnosis not present

## 2016-08-26 DIAGNOSIS — R0689 Other abnormalities of breathing: Secondary | ICD-10-CM | POA: Diagnosis not present

## 2016-08-26 DIAGNOSIS — R402361 Coma scale, best motor response, obeys commands, in the field [EMT or ambulance]: Secondary | ICD-10-CM | POA: Diagnosis not present

## 2016-08-26 DIAGNOSIS — J9811 Atelectasis: Secondary | ICD-10-CM | POA: Diagnosis not present

## 2016-08-26 DIAGNOSIS — J810 Acute pulmonary edema: Secondary | ICD-10-CM | POA: Diagnosis not present

## 2016-08-26 DIAGNOSIS — G309 Alzheimer's disease, unspecified: Secondary | ICD-10-CM | POA: Diagnosis not present

## 2016-08-26 DIAGNOSIS — R410 Disorientation, unspecified: Secondary | ICD-10-CM | POA: Diagnosis not present

## 2016-08-26 DIAGNOSIS — J811 Chronic pulmonary edema: Secondary | ICD-10-CM | POA: Diagnosis not present

## 2016-08-26 DIAGNOSIS — S3993XA Unspecified injury of pelvis, initial encounter: Secondary | ICD-10-CM | POA: Diagnosis not present

## 2016-08-26 DIAGNOSIS — R402251 Coma scale, best verbal response, oriented, in the field [EMT or ambulance]: Secondary | ICD-10-CM | POA: Diagnosis not present

## 2016-08-26 DIAGNOSIS — D62 Acute posthemorrhagic anemia: Secondary | ICD-10-CM | POA: Diagnosis not present

## 2016-08-26 DIAGNOSIS — Z9181 History of falling: Secondary | ICD-10-CM | POA: Diagnosis not present

## 2016-08-26 DIAGNOSIS — S2249XS Multiple fractures of ribs, unspecified side, sequela: Secondary | ICD-10-CM | POA: Diagnosis not present

## 2016-08-26 DIAGNOSIS — M50323 Other cervical disc degeneration at C6-C7 level: Secondary | ICD-10-CM | POA: Diagnosis not present

## 2016-08-26 DIAGNOSIS — S3991XA Unspecified injury of abdomen, initial encounter: Secondary | ICD-10-CM | POA: Diagnosis not present

## 2016-08-26 DIAGNOSIS — Z9889 Other specified postprocedural states: Secondary | ICD-10-CM | POA: Diagnosis not present

## 2016-08-26 DIAGNOSIS — R5381 Other malaise: Secondary | ICD-10-CM | POA: Diagnosis not present

## 2016-08-26 DIAGNOSIS — M6281 Muscle weakness (generalized): Secondary | ICD-10-CM | POA: Diagnosis not present

## 2016-08-26 DIAGNOSIS — S2220XA Unspecified fracture of sternum, initial encounter for closed fracture: Secondary | ICD-10-CM | POA: Diagnosis not present

## 2016-08-26 DIAGNOSIS — R0682 Tachypnea, not elsewhere classified: Secondary | ICD-10-CM | POA: Diagnosis not present

## 2016-08-26 DIAGNOSIS — I4891 Unspecified atrial fibrillation: Secondary | ICD-10-CM | POA: Diagnosis not present

## 2016-08-26 DIAGNOSIS — S27329A Contusion of lung, unspecified, initial encounter: Secondary | ICD-10-CM | POA: Diagnosis not present

## 2016-08-26 DIAGNOSIS — G308 Other Alzheimer's disease: Secondary | ICD-10-CM | POA: Diagnosis not present

## 2016-08-26 DIAGNOSIS — K5900 Constipation, unspecified: Secondary | ICD-10-CM | POA: Diagnosis not present

## 2016-08-26 DIAGNOSIS — R140 Abdominal distension (gaseous): Secondary | ICD-10-CM | POA: Diagnosis not present

## 2016-08-26 DIAGNOSIS — R4182 Altered mental status, unspecified: Secondary | ICD-10-CM | POA: Diagnosis not present

## 2016-08-26 DIAGNOSIS — S2241XD Multiple fractures of ribs, right side, subsequent encounter for fracture with routine healing: Secondary | ICD-10-CM | POA: Diagnosis not present

## 2016-08-26 DIAGNOSIS — M6208 Separation of muscle (nontraumatic), other site: Secondary | ICD-10-CM | POA: Diagnosis not present

## 2016-08-26 DIAGNOSIS — I313 Pericardial effusion (noninflammatory): Secondary | ICD-10-CM | POA: Diagnosis not present

## 2016-08-26 DIAGNOSIS — M6259 Muscle wasting and atrophy, not elsewhere classified, multiple sites: Secondary | ICD-10-CM | POA: Diagnosis not present

## 2016-08-26 DIAGNOSIS — M47813 Spondylosis without myelopathy or radiculopathy, cervicothoracic region: Secondary | ICD-10-CM | POA: Diagnosis not present

## 2016-08-26 DIAGNOSIS — I517 Cardiomegaly: Secondary | ICD-10-CM | POA: Diagnosis not present

## 2016-08-26 DIAGNOSIS — M47812 Spondylosis without myelopathy or radiculopathy, cervical region: Secondary | ICD-10-CM | POA: Diagnosis not present

## 2016-08-26 DIAGNOSIS — M4804 Spinal stenosis, thoracic region: Secondary | ICD-10-CM | POA: Diagnosis not present

## 2016-08-26 DIAGNOSIS — M85831 Other specified disorders of bone density and structure, right forearm: Secondary | ICD-10-CM | POA: Diagnosis not present

## 2016-08-26 DIAGNOSIS — S2243XA Multiple fractures of ribs, bilateral, initial encounter for closed fracture: Secondary | ICD-10-CM | POA: Diagnosis not present

## 2016-08-26 DIAGNOSIS — E039 Hypothyroidism, unspecified: Secondary | ICD-10-CM | POA: Diagnosis not present

## 2016-08-26 DIAGNOSIS — M47814 Spondylosis without myelopathy or radiculopathy, thoracic region: Secondary | ICD-10-CM | POA: Diagnosis not present

## 2016-08-26 DIAGNOSIS — E871 Hypo-osmolality and hyponatremia: Secondary | ICD-10-CM | POA: Diagnosis not present

## 2016-08-26 DIAGNOSIS — S12600A Unspecified displaced fracture of seventh cervical vertebra, initial encounter for closed fracture: Secondary | ICD-10-CM | POA: Diagnosis not present

## 2016-08-26 DIAGNOSIS — S36114A Minor laceration of liver, initial encounter: Secondary | ICD-10-CM | POA: Diagnosis not present

## 2016-08-26 DIAGNOSIS — R918 Other nonspecific abnormal finding of lung field: Secondary | ICD-10-CM | POA: Diagnosis not present

## 2016-08-26 DIAGNOSIS — G319 Degenerative disease of nervous system, unspecified: Secondary | ICD-10-CM | POA: Diagnosis not present

## 2016-08-26 DIAGNOSIS — S298XXA Other specified injuries of thorax, initial encounter: Secondary | ICD-10-CM | POA: Diagnosis not present

## 2016-08-26 DIAGNOSIS — R633 Feeding difficulties: Secondary | ICD-10-CM | POA: Diagnosis not present

## 2016-08-26 DIAGNOSIS — S272XXA Traumatic hemopneumothorax, initial encounter: Secondary | ICD-10-CM | POA: Diagnosis not present

## 2016-08-26 DIAGNOSIS — S2241XA Multiple fractures of ribs, right side, initial encounter for closed fracture: Secondary | ICD-10-CM | POA: Diagnosis not present

## 2016-08-26 DIAGNOSIS — M25421 Effusion, right elbow: Secondary | ICD-10-CM | POA: Diagnosis not present

## 2016-08-26 DIAGNOSIS — I2510 Atherosclerotic heart disease of native coronary artery without angina pectoris: Secondary | ICD-10-CM | POA: Diagnosis not present

## 2016-08-26 DIAGNOSIS — W19XXXA Unspecified fall, initial encounter: Secondary | ICD-10-CM | POA: Diagnosis not present

## 2016-08-26 DIAGNOSIS — M160 Bilateral primary osteoarthritis of hip: Secondary | ICD-10-CM | POA: Diagnosis not present

## 2016-08-26 DIAGNOSIS — M47816 Spondylosis without myelopathy or radiculopathy, lumbar region: Secondary | ICD-10-CM | POA: Diagnosis not present

## 2016-08-26 DIAGNOSIS — J439 Emphysema, unspecified: Secondary | ICD-10-CM | POA: Diagnosis not present

## 2016-08-26 DIAGNOSIS — S12690A Other displaced fracture of seventh cervical vertebra, initial encounter for closed fracture: Secondary | ICD-10-CM | POA: Diagnosis not present

## 2016-08-26 DIAGNOSIS — R402141 Coma scale, eyes open, spontaneous, in the field [EMT or ambulance]: Secondary | ICD-10-CM | POA: Diagnosis not present

## 2016-08-26 DIAGNOSIS — R279 Unspecified lack of coordination: Secondary | ICD-10-CM | POA: Diagnosis not present

## 2016-08-26 DIAGNOSIS — R278 Other lack of coordination: Secondary | ICD-10-CM | POA: Diagnosis not present

## 2016-08-26 DIAGNOSIS — J984 Other disorders of lung: Secondary | ICD-10-CM | POA: Diagnosis not present

## 2016-08-26 DIAGNOSIS — M4692 Unspecified inflammatory spondylopathy, cervical region: Secondary | ICD-10-CM | POA: Diagnosis not present

## 2016-08-26 DIAGNOSIS — S59911A Unspecified injury of right forearm, initial encounter: Secondary | ICD-10-CM | POA: Diagnosis not present

## 2016-08-26 DIAGNOSIS — Z4682 Encounter for fitting and adjustment of non-vascular catheter: Secondary | ICD-10-CM | POA: Diagnosis not present

## 2016-08-26 DIAGNOSIS — I509 Heart failure, unspecified: Secondary | ICD-10-CM | POA: Diagnosis not present

## 2016-08-26 DIAGNOSIS — R1310 Dysphagia, unspecified: Secondary | ICD-10-CM | POA: Diagnosis not present

## 2016-08-26 DIAGNOSIS — N179 Acute kidney failure, unspecified: Secondary | ICD-10-CM | POA: Diagnosis not present

## 2016-08-26 DIAGNOSIS — S0990XA Unspecified injury of head, initial encounter: Secondary | ICD-10-CM | POA: Diagnosis not present

## 2016-08-26 DIAGNOSIS — J90 Pleural effusion, not elsewhere classified: Secondary | ICD-10-CM | POA: Diagnosis not present

## 2016-08-26 DIAGNOSIS — J939 Pneumothorax, unspecified: Secondary | ICD-10-CM | POA: Diagnosis not present

## 2016-08-26 DIAGNOSIS — Z87891 Personal history of nicotine dependence: Secondary | ICD-10-CM | POA: Diagnosis not present

## 2016-08-26 DIAGNOSIS — R932 Abnormal findings on diagnostic imaging of liver and biliary tract: Secondary | ICD-10-CM | POA: Diagnosis not present

## 2016-08-26 LAB — PROTIME-INR
INR: 1.1
PROTHROMBIN TIME: 14.2 s (ref 11.4–15.2)

## 2016-08-26 LAB — APTT: aPTT: 25 seconds (ref 24–36)

## 2016-09-06 DIAGNOSIS — M6259 Muscle wasting and atrophy, not elsewhere classified, multiple sites: Secondary | ICD-10-CM | POA: Diagnosis not present

## 2016-09-06 DIAGNOSIS — S272XXA Traumatic hemopneumothorax, initial encounter: Secondary | ICD-10-CM | POA: Diagnosis not present

## 2016-09-06 DIAGNOSIS — J942 Hemothorax: Secondary | ICD-10-CM | POA: Diagnosis not present

## 2016-09-06 DIAGNOSIS — G8929 Other chronic pain: Secondary | ICD-10-CM | POA: Diagnosis not present

## 2016-09-06 DIAGNOSIS — S12690A Other displaced fracture of seventh cervical vertebra, initial encounter for closed fracture: Secondary | ICD-10-CM | POA: Diagnosis not present

## 2016-09-06 DIAGNOSIS — E119 Type 2 diabetes mellitus without complications: Secondary | ICD-10-CM | POA: Diagnosis not present

## 2016-09-06 DIAGNOSIS — I10 Essential (primary) hypertension: Secondary | ICD-10-CM | POA: Diagnosis not present

## 2016-09-06 DIAGNOSIS — Z9181 History of falling: Secondary | ICD-10-CM | POA: Diagnosis not present

## 2016-09-06 DIAGNOSIS — R279 Unspecified lack of coordination: Secondary | ICD-10-CM | POA: Diagnosis not present

## 2016-09-06 DIAGNOSIS — I120 Hypertensive chronic kidney disease with stage 5 chronic kidney disease or end stage renal disease: Secondary | ICD-10-CM | POA: Diagnosis not present

## 2016-09-06 DIAGNOSIS — J90 Pleural effusion, not elsewhere classified: Secondary | ICD-10-CM | POA: Diagnosis not present

## 2016-09-06 DIAGNOSIS — S2241XA Multiple fractures of ribs, right side, initial encounter for closed fracture: Secondary | ICD-10-CM | POA: Diagnosis not present

## 2016-09-06 DIAGNOSIS — D649 Anemia, unspecified: Secondary | ICD-10-CM | POA: Diagnosis not present

## 2016-09-06 DIAGNOSIS — I4891 Unspecified atrial fibrillation: Secondary | ICD-10-CM | POA: Diagnosis not present

## 2016-09-06 DIAGNOSIS — K219 Gastro-esophageal reflux disease without esophagitis: Secondary | ICD-10-CM | POA: Diagnosis not present

## 2016-09-06 DIAGNOSIS — E785 Hyperlipidemia, unspecified: Secondary | ICD-10-CM | POA: Diagnosis not present

## 2016-09-06 DIAGNOSIS — F0390 Unspecified dementia without behavioral disturbance: Secondary | ICD-10-CM | POA: Diagnosis not present

## 2016-09-06 DIAGNOSIS — G309 Alzheimer's disease, unspecified: Secondary | ICD-10-CM | POA: Diagnosis not present

## 2016-09-06 DIAGNOSIS — S2249XS Multiple fractures of ribs, unspecified side, sequela: Secondary | ICD-10-CM | POA: Diagnosis not present

## 2016-09-06 DIAGNOSIS — E039 Hypothyroidism, unspecified: Secondary | ICD-10-CM | POA: Diagnosis not present

## 2016-09-06 DIAGNOSIS — J939 Pneumothorax, unspecified: Secondary | ICD-10-CM | POA: Diagnosis not present

## 2016-09-06 DIAGNOSIS — N186 End stage renal disease: Secondary | ICD-10-CM | POA: Diagnosis not present

## 2016-09-06 DIAGNOSIS — F418 Other specified anxiety disorders: Secondary | ICD-10-CM | POA: Diagnosis not present

## 2016-09-06 DIAGNOSIS — G308 Other Alzheimer's disease: Secondary | ICD-10-CM | POA: Diagnosis not present

## 2016-09-06 DIAGNOSIS — F339 Major depressive disorder, recurrent, unspecified: Secondary | ICD-10-CM | POA: Diagnosis not present

## 2016-09-06 DIAGNOSIS — M6281 Muscle weakness (generalized): Secondary | ICD-10-CM | POA: Diagnosis not present

## 2016-09-06 DIAGNOSIS — W19XXXA Unspecified fall, initial encounter: Secondary | ICD-10-CM | POA: Diagnosis not present

## 2016-09-06 DIAGNOSIS — J449 Chronic obstructive pulmonary disease, unspecified: Secondary | ICD-10-CM | POA: Diagnosis not present

## 2016-09-06 DIAGNOSIS — I6990 Unspecified sequelae of unspecified cerebrovascular disease: Secondary | ICD-10-CM | POA: Diagnosis not present

## 2016-09-06 DIAGNOSIS — R262 Difficulty in walking, not elsewhere classified: Secondary | ICD-10-CM | POA: Diagnosis not present

## 2016-09-06 DIAGNOSIS — R0902 Hypoxemia: Secondary | ICD-10-CM | POA: Diagnosis not present

## 2016-09-06 DIAGNOSIS — R278 Other lack of coordination: Secondary | ICD-10-CM | POA: Diagnosis not present

## 2016-09-06 DIAGNOSIS — R5381 Other malaise: Secondary | ICD-10-CM | POA: Diagnosis not present

## 2016-09-06 DIAGNOSIS — E279 Disorder of adrenal gland, unspecified: Secondary | ICD-10-CM | POA: Diagnosis not present

## 2016-09-06 DIAGNOSIS — S272XXS Traumatic hemopneumothorax, sequela: Secondary | ICD-10-CM | POA: Diagnosis not present

## 2016-09-06 DIAGNOSIS — S2241XD Multiple fractures of ribs, right side, subsequent encounter for fracture with routine healing: Secondary | ICD-10-CM | POA: Diagnosis not present

## 2016-09-06 DIAGNOSIS — S271XXA Traumatic hemothorax, initial encounter: Secondary | ICD-10-CM | POA: Diagnosis not present

## 2016-09-06 DIAGNOSIS — S270XXA Traumatic pneumothorax, initial encounter: Secondary | ICD-10-CM | POA: Diagnosis not present

## 2016-09-06 DIAGNOSIS — J189 Pneumonia, unspecified organism: Secondary | ICD-10-CM | POA: Diagnosis not present

## 2016-09-06 DIAGNOSIS — J439 Emphysema, unspecified: Secondary | ICD-10-CM | POA: Diagnosis not present

## 2016-09-11 DIAGNOSIS — E119 Type 2 diabetes mellitus without complications: Secondary | ICD-10-CM | POA: Diagnosis not present

## 2016-09-11 DIAGNOSIS — N186 End stage renal disease: Secondary | ICD-10-CM | POA: Diagnosis not present

## 2016-09-11 DIAGNOSIS — K219 Gastro-esophageal reflux disease without esophagitis: Secondary | ICD-10-CM | POA: Diagnosis not present

## 2016-09-11 DIAGNOSIS — F418 Other specified anxiety disorders: Secondary | ICD-10-CM | POA: Diagnosis not present

## 2016-09-11 DIAGNOSIS — I6990 Unspecified sequelae of unspecified cerebrovascular disease: Secondary | ICD-10-CM | POA: Diagnosis not present

## 2016-09-11 DIAGNOSIS — I120 Hypertensive chronic kidney disease with stage 5 chronic kidney disease or end stage renal disease: Secondary | ICD-10-CM | POA: Diagnosis not present

## 2016-09-11 DIAGNOSIS — E785 Hyperlipidemia, unspecified: Secondary | ICD-10-CM | POA: Diagnosis not present

## 2016-10-05 DIAGNOSIS — J189 Pneumonia, unspecified organism: Secondary | ICD-10-CM | POA: Diagnosis not present

## 2016-10-07 NOTE — Progress Notes (Deleted)
[place]  [phone]  [phone]     Vanessa Hancock DOB: 01-16-1932  MR#: [id number]  ZMO#:[id number]  Patient Care Team: Madelyn Brunner, MD as PCP - General (Internal Medicine)  CHIEF COMPLAINT: Thyroid cancer  INTERVAL HISTORY: Patient returns to clinic today for follow up of papillary thyroid cancer. She is a nursing home resident. She is feeling well today without complaints. She had a thyroidectomy in 2004 for papillary thyroid cancer followed by radioactive iodine treatment and [facility]. Initial stage was T3 N2. She developed an endobronchial recurrence biopsy-positive. Palliative radiation therapy in May 2014. She also has noted pulmonary nodules which were hypermetabolic in 0354 consistent with metastatic disease. She is doing well currently on Synthroid. She specifically denies head and neck pain or dysphagia. She continues to be hoarse.  REVIEW OF SYSTEMS:   Review of Systems  Constitutional: Negative.   HENT:       Hoarseness  Eyes: Negative.   Respiratory: Negative.   Cardiovascular: Negative.   Gastrointestinal: Negative.   Genitourinary: Negative.   Musculoskeletal: Negative.   Skin: Negative.   Neurological: Negative.   Endo/Heme/Allergies: Negative.   Psychiatric/Behavioral: Negative.     As per HPI. Otherwise, a complete review of systems is negatve.  ONCOLOGY HISTORY: Oncology History   83 year old female with right vocal cord paralysis and recurrent papillary thyroid cancer biopsy proven patient had diagnosis of thyroid cancer in 2004 had thyroidectomy followed by radioactive iodine treatment at  [facility].  initial diagnosis of papillary thyroid carcinoma  pT3 pN2 status post thyroidectomy in 2004 Recent endobronchial ultrasound-guided biopsies positive for papillary carcinoma 2.palliative radiation therapy started in May of 2014 3.patient has finished 7000 rads to the laryngeal area in June of  2014     Recurrent thyroid cancer [facility])    PAST MEDICAL HISTORY: Past Medical History:  Diagnosis Date  . A-fib ([place])   . Alzheimer disease    early stages (2016)  . Cancer ([place])    Thyroid  . CHF (congestive heart failure) ([place])   . COPD (chronic obstructive pulmonary disease) ([place])   . Macular degeneration   . Recurrent thyroid cancer ([place]) 03/28/2015  . Sleep apnea     PAST SURGICAL HISTORY: Past Surgical History:  Procedure Laterality Date  . APPENDECTOMY    . THYROIDECTOMY    . TONSILLECTOMY    . vericose vein      FAMILY HISTORY No family history on file.    ADVANCED DIRECTIVES:    HEALTH MAINTENANCE: Social History  Substance Use Topics  . Smoking status: Former Research scientist (life sciences)  . Smokeless tobacco: Never Used  . Alcohol use No    No Known Allergies  Current Outpatient Prescriptions  Medication Sig Dispense Refill  . acetaminophen (TYLENOL) 325 MG tablet Take 325 mg by mouth every 6 (six) hours as needed for mild pain, moderate pain or fever.     Marland Kitchen ADVAIR DISKUS 250-50 MCG/DOSE AEPB Inhale 1 puff into the lungs every 12 (twelve) hours. Reported on 04/09/2016    . alendronate (FOSAMAX) 70 MG tablet TAKE (1) ONCE A WEEK 30 MINUTES BEFORE BREAKFAST WITH FULL GLASS OF WA TER. DO NOT LIE DOWN AFTER.    . ARIPiprazole (ABILIFY) 2 MG tablet Take 2 mg by mouth daily.     . Artificial Saliva (BIOTENE MOISTURIZING MOUTH) SOLN Swish 5-10 ml by mouth then spit two times a day 473 mL 0  . aspirin EC 81 MG tablet TAKE  1 TABLET BY MOUTH ONCE DAILY.    Marland Kitchen atorvastatin (LIPITOR) 20 MG tablet TAKE 1 TABLET BY MOUTH ONCE DAILY FOR CHOLESTEROL    . calcitRIOL (ROCALTROL) 0.25 MCG capsule TAKE (1) CAPSULE BY MOUTH TWICE DAILY.    . calcium carbonate (CALCIUM 600) 600 MG TABS tablet TAKE (2) TABLETS BY MOUTH THREE TIMES DAILY.    . cetirizine (ZYRTEC) 10 MG tablet TAKE 1 TABLET BY MOUTH ONCE DAILY.    Marland Kitchen clopidogrel (PLAVIX) 75 MG tablet TAKE 1 TABLET BY MOUTH ONCE DAILY.    . digoxin  (DIGOX) 0.125 MG tablet TAKE 1 TABLET BY MOUTH ONCE DAILY.    Marland Kitchen docusate sodium (COLACE) 100 MG capsule TAKE (1) CAPSULE BY MOUTH TWICE DAILY.    Ruthe Mannan 100-5 MCG/ACT AERO Reported on 04/09/2016    . enalapril (VASOTEC) 2.5 MG tablet TAKE (1) TABLET BY MOUTH DAILY FOR HIGH BLOOD PRESSURE.    Marland Kitchen escitalopram (LEXAPRO) 20 MG tablet Take 20 mg by mouth daily.     . ferrous sulfate 325 (65 FE) MG tablet TAKE 1 TABLET BY MOUTH ONCE DAILY.    . fluticasone (FLONASE) 50 MCG/ACT nasal spray Place into the nose.    . furosemide (LASIX) 20 MG tablet Take 20 mg by mouth 2 (two) times daily.    Marland Kitchen ipratropium-albuterol (DUONEB) 0.5-2.5 (3) MG/3ML SOLN Inhale into the lungs.    Marland Kitchen ipratropium-albuterol (DUONEB) 0.5-2.5 (3) MG/3ML SOLN Take 3 mLs by nebulization every 4 (four) hours as needed (wheezing).    Marland Kitchen levothyroxine (SYNTHROID, LEVOTHROID) 137 MCG tablet TAKE 1 TABLET BY MOUTH ONCE DAILY.    . metoprolol tartrate (LOPRESSOR) 25 MG tablet TAKE 1 TABLET BY MOUTH TWICE DAILY    . Multiple Vitamins-Minerals (I-VITE) TABS TAKE 1 TABLET BY MOUTH ONCE DAILY.    . pantoprazole (PROTONIX) 40 MG tablet TAKE 1 TABLET BY MOUTH ONCE DAILY.    Marland Kitchen potassium chloride SA (K-DUR,KLOR-CON) 20 MEQ tablet TAKE 1 TABLET BY MOUTH ONCE DAILY.    Marland Kitchen predniSONE (DELTASONE) 10 MG tablet Take 10 mg by mouth daily with breakfast. 65m x 3, 331mx 3, 2032m 3, 39m1m3    . tiotropium (SPIRIVA HANDIHALER) 18 MCG inhalation capsule INHALE 1 CAPSULE AS DIRECTED ONCE DAILY.     No current facility-administered medications for this visit.     OBJECTIVE: There were no vitals taken for this visit.   There is no height or weight on file to calculate BMI.    ECOG FS:1 - Symptomatic but completely ambulatory  General: Well-developed, well-nourished, no acute distress. Eyes: Pink conjunctiva, anicteric sclera. HEENT: Normocephalic, moist mucous membranes, clear oropharnyx. Lungs: Clear to auscultation bilaterally. Heart: Irregular rate  and rhythm, history afib. No rubs, murmurs, or gallops. Musculoskeletal: No edema, cyanosis, or clubbing. Neuro: Alert, answering all questions appropriately. Cranial nerves grossly intact. Skin: No rashes or petechiae noted. Psych: Normal affect.  LAB RESULTS:  No visits with results within 3 Day(s) from this visit.  Latest known visit with results is:  Admission on 08/25/2016, Discharged on 08/26/2016  Component Date Value Ref Range Status  . WBC 08/25/2016 9.6  3.6 - 11.0 K/uL Final  . RBC 08/25/2016 3.90  3.80 - 5.20 MIL/uL Final  . Hemoglobin 08/25/2016 10.5* 12.0 - 16.0 g/dL Final  . HCT 08/25/2016 31.2* 35.0 - 47.0 % Final  . MCV 08/25/2016 80.0  80.0 - 100.0 fL Final  . MCH 08/25/2016 27.0  26.0 - 34.0 pg Final  . MCHC 08/25/2016  33.8  32.0 - 36.0 g/dL Final  . RDW 08/25/2016 17.5* 11.5 - 14.5 % Final  . Platelets 08/25/2016 325  150 - 440 K/uL Final  . Neutrophils Relative % 08/25/2016 81  % Final  . Neutro Abs 08/25/2016 7.8* 1.4 - 6.5 K/uL Final  . Lymphocytes Relative 08/25/2016 11  % Final  . Lymphs Abs 08/25/2016 1.1  1.0 - 3.6 K/uL Final  . Monocytes Relative 08/25/2016 7  % Final  . Monocytes Absolute 08/25/2016 0.6  0.2 - 0.9 K/uL Final  . Eosinophils Relative 08/25/2016 0  % Final  . Eosinophils Absolute 08/25/2016 0.0  0 - 0.7 K/uL Final  . Basophils Relative 08/25/2016 1  % Final  . Basophils Absolute 08/25/2016 0.1  0 - 0.1 K/uL Final  . Sodium 08/25/2016 136  135 - 145 mmol/L Final  . Potassium 08/25/2016 3.9  3.5 - 5.1 mmol/L Final  . Chloride 08/25/2016 96* 101 - 111 mmol/L Final  . CO2 08/25/2016 30  22 - 32 mmol/L Final  . Glucose, Bld 08/25/2016 222* 65 - 99 mg/dL Final  . BUN 08/25/2016 50* 6 - 20 mg/dL Final  . Creatinine, Ser 08/25/2016 1.45* 0.44 - 1.00 mg/dL Final  . Calcium 08/25/2016 11.0* 8.9 - 10.3 mg/dL Final  . GFR calc non Af Amer 08/25/2016 33* >60 mL/min Final  . GFR calc Af Amer 08/25/2016 38* >60 mL/min Final   Comment: (NOTE) The  eGFR has been calculated using the CKD EPI equation. This calculation has not been validated in all clinical situations. eGFR's persistently <60 mL/min signify possible Chronic Kidney Disease.   . Anion gap 08/25/2016 10  5 - 15 Final  . Influenza A By PCR 08/25/2016 NEGATIVE  NEGATIVE Final  . Influenza B By PCR 08/25/2016 NEGATIVE  NEGATIVE Final   Comment: (NOTE) The Xpert Xpress Flu assay is intended as an aid in the diagnosis of  influenza and should not be used as a sole basis for treatment.  This  assay is FDA approved for nasopharyngeal swab specimens only. Nasal  washings and aspirates are unacceptable for Xpert Xpress Flu testing.   . Prothrombin Time 08/26/2016 14.2  11.4 - 15.2 seconds Final  . INR 08/26/2016 1.10   Final  . aPTT 08/26/2016 25  24 - 36 seconds Final    STUDIES: No results found.  ASSESSMENT:  Thyroid cancer.  PLAN:    1. Thyroid cancer- Patient is clinically stable. Her thyroid labs are still pending. Continue 137 g of Synthroid which will be adjusted depending on T4 and TSH. The thyroglobulin keeps on climbing that a PET scan would be ordered.Otherwise return to clinic in 6 months with repeat labs.  2. Anemia- HGB 10.7 today.  3. Hoarseness- Unchanged.  Patient expressed understanding and was in agreement with this plan. She also understands that She can call clinic at any time with any questions, concerns, or complaints.   Dr. [name] was available for consultation and review of plan of care for this patient.   Lloyd Huger, MD   10/07/2016 10:36 PM

## 2016-10-08 DIAGNOSIS — D649 Anemia, unspecified: Secondary | ICD-10-CM | POA: Diagnosis not present

## 2016-10-08 DIAGNOSIS — I4891 Unspecified atrial fibrillation: Secondary | ICD-10-CM | POA: Diagnosis not present

## 2016-10-08 DIAGNOSIS — J449 Chronic obstructive pulmonary disease, unspecified: Secondary | ICD-10-CM | POA: Diagnosis not present

## 2016-10-08 DIAGNOSIS — E039 Hypothyroidism, unspecified: Secondary | ICD-10-CM | POA: Diagnosis not present

## 2016-10-08 DIAGNOSIS — G8929 Other chronic pain: Secondary | ICD-10-CM | POA: Diagnosis not present

## 2016-10-08 DIAGNOSIS — G309 Alzheimer's disease, unspecified: Secondary | ICD-10-CM | POA: Diagnosis not present

## 2016-10-08 DIAGNOSIS — I10 Essential (primary) hypertension: Secondary | ICD-10-CM | POA: Diagnosis not present

## 2016-10-08 DIAGNOSIS — J90 Pleural effusion, not elsewhere classified: Secondary | ICD-10-CM | POA: Diagnosis not present

## 2016-10-10 ENCOUNTER — Inpatient Hospital Stay: Payer: Commercial Managed Care - HMO

## 2016-10-10 ENCOUNTER — Inpatient Hospital Stay: Payer: Commercial Managed Care - HMO | Admitting: Oncology

## 2016-10-13 DIAGNOSIS — R0902 Hypoxemia: Secondary | ICD-10-CM | POA: Diagnosis not present

## 2016-10-15 DIAGNOSIS — I5032 Chronic diastolic (congestive) heart failure: Secondary | ICD-10-CM

## 2016-10-15 DIAGNOSIS — I482 Chronic atrial fibrillation: Secondary | ICD-10-CM | POA: Diagnosis not present

## 2016-10-15 DIAGNOSIS — J438 Other emphysema: Secondary | ICD-10-CM | POA: Diagnosis not present

## 2016-10-15 DIAGNOSIS — F331 Major depressive disorder, recurrent, moderate: Secondary | ICD-10-CM

## 2016-10-15 DIAGNOSIS — G301 Alzheimer's disease with late onset: Secondary | ICD-10-CM

## 2016-10-23 NOTE — Progress Notes (Signed)
[place]  [phone]  [phone]     Vanessa Hancock DOB: 30-Sep-1931  MR#: [id number]  SPQ#:[id number]  Patient Care Team: Madelyn Brunner, MD as PCP - General (Internal Medicine)  CHIEF COMPLAINT: Thyroid cancer  INTERVAL HISTORY: Patient returns to clinic today for routine six-month follow-up. Patient was evaluated in the emergency room approximately 2 months ago with concern of CVA. No distinct etiology was determined, but patient did have a CT of her chest which suggested progression of her known metastatic disease in her lungs. Her performance status is declining and she currently resides in assisted living. She is accompanied by her daughter who reports most of the history.  She has no new neurologic complaints. She denies any recent fevers. She denies any chest pain, shortness of breath, cough, or hemoptysis. She has no nausea, vomiting, constipation, or diarrhea. She has no urinary complaints. Patient otherwise feels well and offers no further specific complaints.  REVIEW OF SYSTEMS:   Review of Systems  Constitutional: Positive for malaise/fatigue. Negative for fever and weight loss.  HENT: Negative.   Respiratory: Negative.  Negative for cough, hemoptysis and shortness of breath.   Cardiovascular: Negative.  Negative for chest pain and leg swelling.  Gastrointestinal: Negative.  Negative for abdominal pain.  Genitourinary: Negative.   Musculoskeletal: Negative.   Neurological: Positive for weakness.  Psychiatric/Behavioral: Positive for memory loss. The patient is not nervous/anxious.     As per HPI. Otherwise, a complete review of systems is negative.  ONCOLOGY HISTORY: Oncology History   83 year old female with right vocal cord paralysis and recurrent papillary thyroid cancer biopsy proven patient had diagnosis of thyroid cancer in 2004 had thyroidectomy followed by radioactive iodine treatment at  [facility].  initial  diagnosis of papillary thyroid carcinoma  pT3 pN2 status post thyroidectomy in 2004 Recent endobronchial ultrasound-guided biopsies positive for papillary carcinoma 2.palliative radiation therapy started in May of 2014 3.patient has finished 7000 rads to the laryngeal area in June of 2014     Recurrent thyroid cancer [facility])    PAST MEDICAL HISTORY: Past Medical History:  Diagnosis Date  . A-fib ([place])   . Alzheimer disease    early stages (2016)  . Cancer ([place])    Thyroid  . CHF (congestive heart failure) ([place])   . COPD (chronic obstructive pulmonary disease) ([place])   . Macular degeneration   . Recurrent thyroid cancer ([place]) 03/28/2015  . Sleep apnea     PAST SURGICAL HISTORY: Past Surgical History:  Procedure Laterality Date  . APPENDECTOMY    . THYROIDECTOMY    . TONSILLECTOMY    . vericose vein      FAMILY HISTORY No family history on file.    ADVANCED DIRECTIVES:    HEALTH MAINTENANCE: Social History  Substance Use Topics  . Smoking status: Former Research scientist (life sciences)  . Smokeless tobacco: Never Used  . Alcohol use No    No Known Allergies  Current Outpatient Prescriptions  Medication Sig Dispense Refill  . acetaminophen (TYLENOL) 325 MG tablet Take 325 mg by mouth every 6 (six) hours as needed for mild pain, moderate pain or fever.     Marland Kitchen ADVAIR DISKUS 250-50 MCG/DOSE AEPB Inhale 1 puff into the lungs every 12 (twelve) hours. Reported on 04/09/2016    . alendronate (FOSAMAX) 70 MG tablet TAKE (1) ONCE A WEEK 30 MINUTES BEFORE BREAKFAST WITH FULL GLASS OF WA TER. DO NOT LIE DOWN AFTER.    . ARIPiprazole (ABILIFY) 2  MG tablet Take 2 mg by mouth daily.     Marland Kitchen aspirin EC 81 MG tablet TAKE 1 TABLET BY MOUTH ONCE DAILY.    Marland Kitchen atorvastatin (LIPITOR) 20 MG tablet TAKE 1 TABLET BY MOUTH ONCE DAILY FOR CHOLESTEROL    . calcitRIOL (ROCALTROL) 0.25 MCG capsule TAKE (1) CAPSULE BY MOUTH TWICE DAILY.    . calcium carbonate (TUMS - DOSED IN MG ELEMENTAL CALCIUM) 500 MG chewable tablet Chew  2 tablets by mouth 3 (three) times daily.    . digoxin (DIGOX) 0.125 MG tablet TAKE 1 TABLET BY MOUTH ONCE DAILY.    Marland Kitchen docusate sodium (COLACE) 100 MG capsule TAKE (1) CAPSULE BY MOUTH TWICE DAILY.    Ruthe Mannan 100-5 MCG/ACT AERO Reported on 04/09/2016    . enalapril (VASOTEC) 2.5 MG tablet TAKE (1) TABLET BY MOUTH DAILY FOR HIGH BLOOD PRESSURE.    . ergocalciferol (VITAMIN D2) 50000 units capsule Take 50,000 Units by mouth once a week. Give on Monday    . escitalopram (LEXAPRO) 20 MG tablet Take 20 mg by mouth daily.     . ferrous sulfate 325 (65 FE) MG tablet TAKE 1 TABLET BY MOUTH ONCE DAILY.    . fluticasone (FLONASE) 50 MCG/ACT nasal spray Place into the nose.    . fluticasone furoate-vilanterol (BREO ELLIPTA) 100-25 MCG/INH AEPB Inhale 1 puff into the lungs.    . furosemide (LASIX) 20 MG tablet Take 20 mg by mouth 2 (two) times daily.    Marland Kitchen ipratropium-albuterol (DUONEB) 0.5-2.5 (3) MG/3ML SOLN Take 3 mLs by nebulization every 4 (four) hours as needed (wheezing).    Marland Kitchen levothyroxine (SYNTHROID, LEVOTHROID) 137 MCG tablet TAKE 1 TABLET BY MOUTH ONCE DAILY.    . metoprolol tartrate (LOPRESSOR) 25 MG tablet TAKE 1 TABLET BY MOUTH TWICE DAILY    . Multiple Vitamins-Minerals (I-VITE) TABS TAKE 1 TABLET BY MOUTH ONCE DAILY.    Marland Kitchen omeprazole (PRILOSEC) 20 MG capsule Take 20 mg by mouth daily.    Marland Kitchen oxyCODONE-acetaminophen (PERCOCET/ROXICET) 5-325 MG tablet Take by mouth every 4 (four) hours as needed for severe pain.    . polyethylene glycol (MIRALAX / GLYCOLAX) packet Take 17 packets by mouth daily. Mix with 8 oz of fluid.    . potassium chloride SA (K-DUR,KLOR-CON) 20 MEQ tablet TAKE 1 TABLET BY MOUTH ONCE DAILY.    Marland Kitchen predniSONE (DELTASONE) 10 MG tablet Take 10 mg by mouth daily with breakfast. 36m x 3, 382mx 3, 2042m 3, 87m3m3    . senna (SENOKOT) 8.6 MG tablet Take 1 tablet by mouth daily.    . tiMarland Kitchentropium (SPIRIVA HANDIHALER) 18 MCG inhalation capsule INHALE 1 CAPSULE AS DIRECTED ONCE DAILY.      No current facility-administered medications for this visit.     OBJECTIVE: BP 113/60 (BP Location: Left Wrist, Patient Position: Sitting)   Pulse (!) 46   Temp 98.5 F (36.9 C) (Tympanic)   Ht '5\' 8"'  (1.727 m)   Wt 175 lb (79.4 kg)   SpO2 98%   BMI 26.61 kg/m    Body mass index is 26.61 kg/m.    ECOG FS:1 - Symptomatic but completely ambulatory  General: Well-developed, well-nourished, no acute distress. Eyes: Pink conjunctiva, anicteric sclera. HEENT: Normocephalic, moist mucous membranes, clear oropharnyx. No palpable lymphadenopathy. Lungs: Clear to auscultation bilaterally. Heart: Irregular rate and rhythm, history afib. No rubs, murmurs, or gallops. Musculoskeletal: No edema, cyanosis, or clubbing. Neuro: Alert, answering all questions appropriately. Cranial nerves grossly intact. Skin: No  rashes or petechiae noted. Psych: Normal affect.  LAB RESULTS:  Clinical Support on 10/24/2016  Component Date Value Ref Range Status  . WBC 10/24/2016 6.8  3.6 - 11.0 K/uL Final  . RBC 10/24/2016 3.35* 3.80 - 5.20 MIL/uL Final  . Hemoglobin 10/24/2016 8.5* 12.0 - 16.0 g/dL Final  . HCT 10/24/2016 26.2* 35.0 - 47.0 % Final  . MCV 10/24/2016 78.1* 80.0 - 100.0 fL Final  . MCH 10/24/2016 25.5* 26.0 - 34.0 pg Final  . MCHC 10/24/2016 32.6  32.0 - 36.0 g/dL Final  . RDW 10/24/2016 17.1* 11.5 - 14.5 % Final  . Platelets 10/24/2016 222  150 - 440 K/uL Final  . Neutrophils Relative % 10/24/2016 77  % Final  . Neutro Abs 10/24/2016 5.2  1.4 - 6.5 K/uL Final  . Lymphocytes Relative 10/24/2016 12  % Final  . Lymphs Abs 10/24/2016 0.8* 1.0 - 3.6 K/uL Final  . Monocytes Relative 10/24/2016 8  % Final  . Monocytes Absolute 10/24/2016 0.6  0.2 - 0.9 K/uL Final  . Eosinophils Relative 10/24/2016 2  % Final  . Eosinophils Absolute 10/24/2016 0.2  0 - 0.7 K/uL Final  . Basophils Relative 10/24/2016 1  % Final  . Basophils Absolute 10/24/2016 0.1  0 - 0.1 K/uL Final  . TSH 10/24/2016 13.416*  0.350 - 4.500 uIU/mL Final  . Thyroglobulin Antibody 10/24/2016 <1.0  0.0 - 0.9 IU/mL Final   Comment: (NOTE) Thyroglobulin Antibody measured by [facility] Methodology Performed At: [facility] [place], [place] [id number] Lindon Romp MD FU:[id number]   . Sodium 10/24/2016 132* 135 - 145 mmol/L Final  . Potassium 10/24/2016 4.0  3.5 - 5.1 mmol/L Final  . Chloride 10/24/2016 95* 101 - 111 mmol/L Final  . CO2 10/24/2016 26  22 - 32 mmol/L Final  . Glucose, Bld 10/24/2016 123* 65 - 99 mg/dL Final  . BUN 10/24/2016 46* 6 - 20 mg/dL Final  . Creatinine, Ser 10/24/2016 3.73* 0.44 - 1.00 mg/dL Final  . Calcium 10/24/2016 6.3* 8.9 - 10.3 mg/dL Final   Comment: RESULTS VERIFIED BY REPEAT TESTING CRITICAL RESULT CALLED TO, READ BACK BY AND VERIFIED WITH HAYLEY RHODE AT '@1522'  10/24/2016 LGR   . Total Protein 10/24/2016 7.5  6.5 - 8.1 g/dL Final  . Albumin 10/24/2016 2.8* 3.5 - 5.0 g/dL Final  . AST 10/24/2016 19  15 - 41 U/L Final  . ALT 10/24/2016 8* 14 - 54 U/L Final  . Alkaline Phosphatase 10/24/2016 81  38 - 126 U/L Final  . Total Bilirubin 10/24/2016 0.2* 0.3 - 1.2 mg/dL Final  . GFR calc non Af Amer 10/24/2016 10* >60 mL/min Final  . GFR calc Af Amer 10/24/2016 12* >60 mL/min Final   Comment: (NOTE) The eGFR has been calculated using the CKD EPI equation. This calculation has not been validated in all clinical situations. eGFR's persistently <60 mL/min signify possible Chronic Kidney Disease.   . Anion gap 10/24/2016 11  5 - 15 Final  . Thyroglobulin by IMA 10/24/2016 67.9* 1.5 - 38.5 ng/mL Final   Comment: (NOTE) According to the [facility] of Clinical Biochemistry, the reference interval for Thyroglobulin (TG) should be related to euthyroid patients and not for patients who underwent thyroidectomy. TG reference intervals for these patients depend on the residual mass of the thyroid tissue left after surgery. Establishing a post-operative  baseline is recommended. The assay limit of quantitation is 0.1 ng/mL Thyroglobulin measured by [employer] Immunometric Assay Performed At: [facility] [place], [place] [id number] Lindon Romp MD QF:[id number]     STUDIES: No results found.  ASSESSMENT:  Thyroid cancer.  PLAN:    1. Thyroid cancer: CT scan results from August 25, 2016 reviewed independently concerning for progression of disease. Patient also has a significantly increased thyroid globulin level of 67.9. Her thyroid globulin antibodies are negative. Given patient's advanced age and declining performance status, she has indicated that she likely would not pursue any additional systemic treatment, but could consider oral lenvatinib or sorafenib if desired. Patient has agreed to pursue PET scan to assess determine the extent of her disease. Return to clinic one to 2 days after her scan for further evaluation. 2. Thyroid panel: Patient's TSH is elevated at 13.4, continue current dose of Synthroid. We will forward results to primary care. 3. Hypocalcemia: Patient's calcium levels are decreased today. Continue oral calcium supplementation and we will repeat laboratory work in 1 week. 4. Anemia: Patient's hemoglobin has trended down from 10.5 to 8.5 over the past 2 months, will repeat iron stores at next clinic visit.  Approximately 30 minutes was spent in discussion of which greater than 50% was consultation.  Patient expressed understanding and was in agreement with this plan. She also understands that She can call clinic at any time with any questions, concerns, or complaints.     Lloyd Huger, MD   10/27/2016 9:25 AM

## 2016-10-24 ENCOUNTER — Inpatient Hospital Stay (HOSPITAL_BASED_OUTPATIENT_CLINIC_OR_DEPARTMENT_OTHER): Payer: Medicare HMO | Admitting: Oncology

## 2016-10-24 ENCOUNTER — Inpatient Hospital Stay: Payer: Medicare HMO | Attending: Oncology

## 2016-10-24 VITALS — BP 113/60 | HR 46 | Temp 98.5°F | Ht 68.0 in | Wt 175.0 lb

## 2016-10-24 DIAGNOSIS — Z79899 Other long term (current) drug therapy: Secondary | ICD-10-CM | POA: Diagnosis not present

## 2016-10-24 DIAGNOSIS — Z7982 Long term (current) use of aspirin: Secondary | ICD-10-CM | POA: Diagnosis not present

## 2016-10-24 DIAGNOSIS — Z87891 Personal history of nicotine dependence: Secondary | ICD-10-CM

## 2016-10-24 DIAGNOSIS — R531 Weakness: Secondary | ICD-10-CM | POA: Diagnosis not present

## 2016-10-24 DIAGNOSIS — G4730 Sleep apnea, unspecified: Secondary | ICD-10-CM | POA: Diagnosis not present

## 2016-10-24 DIAGNOSIS — F0280 Dementia in other diseases classified elsewhere without behavioral disturbance: Secondary | ICD-10-CM | POA: Diagnosis not present

## 2016-10-24 DIAGNOSIS — R5383 Other fatigue: Secondary | ICD-10-CM | POA: Insufficient documentation

## 2016-10-24 DIAGNOSIS — J449 Chronic obstructive pulmonary disease, unspecified: Secondary | ICD-10-CM

## 2016-10-24 DIAGNOSIS — I4891 Unspecified atrial fibrillation: Secondary | ICD-10-CM | POA: Diagnosis not present

## 2016-10-24 DIAGNOSIS — I509 Heart failure, unspecified: Secondary | ICD-10-CM

## 2016-10-24 DIAGNOSIS — C73 Malignant neoplasm of thyroid gland: Secondary | ICD-10-CM | POA: Insufficient documentation

## 2016-10-24 DIAGNOSIS — G309 Alzheimer's disease, unspecified: Secondary | ICD-10-CM | POA: Insufficient documentation

## 2016-10-24 DIAGNOSIS — Z923 Personal history of irradiation: Secondary | ICD-10-CM | POA: Diagnosis not present

## 2016-10-24 DIAGNOSIS — D649 Anemia, unspecified: Secondary | ICD-10-CM

## 2016-10-24 DIAGNOSIS — F0390 Unspecified dementia without behavioral disturbance: Secondary | ICD-10-CM

## 2016-10-24 LAB — COMPREHENSIVE METABOLIC PANEL
ALK PHOS: 81 U/L (ref 38–126)
ALT: 8 U/L — ABNORMAL LOW (ref 14–54)
ANION GAP: 11 (ref 5–15)
AST: 19 U/L (ref 15–41)
Albumin: 2.8 g/dL — ABNORMAL LOW (ref 3.5–5.0)
BUN: 46 mg/dL — ABNORMAL HIGH (ref 6–20)
CALCIUM: 6.3 mg/dL — AB (ref 8.9–10.3)
CO2: 26 mmol/L (ref 22–32)
Chloride: 95 mmol/L — ABNORMAL LOW (ref 101–111)
Creatinine, Ser: 3.73 mg/dL — ABNORMAL HIGH (ref 0.44–1.00)
GFR calc non Af Amer: 10 mL/min — ABNORMAL LOW (ref >60)
GFR, EST AFRICAN AMERICAN: 12 mL/min — AB (ref >60)
Glucose, Bld: 123 mg/dL — ABNORMAL HIGH (ref 65–99)
POTASSIUM: 4 mmol/L (ref 3.5–5.1)
SODIUM: 132 mmol/L — AB (ref 135–145)
TOTAL PROTEIN: 7.5 g/dL (ref 6.5–8.1)
Total Bilirubin: 0.2 mg/dL — ABNORMAL LOW (ref 0.3–1.2)

## 2016-10-24 LAB — CBC WITH DIFFERENTIAL/PLATELET
BASOS PCT: 1 %
Basophils Absolute: 0.1 10*3/uL (ref 0–0.1)
EOS ABS: 0.2 10*3/uL (ref 0–0.7)
EOS PCT: 2 %
HEMATOCRIT: 26.2 % — AB (ref 35.0–47.0)
Hemoglobin: 8.5 g/dL — ABNORMAL LOW (ref 12.0–16.0)
LYMPHS PCT: 12 %
Lymphs Abs: 0.8 10*3/uL — ABNORMAL LOW (ref 1.0–3.6)
MCH: 25.5 pg — ABNORMAL LOW (ref 26.0–34.0)
MCHC: 32.6 g/dL (ref 32.0–36.0)
MCV: 78.1 fL — ABNORMAL LOW (ref 80.0–100.0)
MONO ABS: 0.6 10*3/uL (ref 0.2–0.9)
MONOS PCT: 8 %
NEUTROS PCT: 77 %
Neutro Abs: 5.2 10*3/uL (ref 1.4–6.5)
PLATELETS: 222 10*3/uL (ref 150–440)
RBC: 3.35 MIL/uL — ABNORMAL LOW (ref 3.80–5.20)
RDW: 17.1 % — AB (ref 11.5–14.5)
WBC: 6.8 10*3/uL (ref 3.6–11.0)

## 2016-10-24 LAB — TSH: TSH: 13.416 u[IU]/mL — ABNORMAL HIGH (ref 0.350–4.500)

## 2016-10-24 NOTE — Progress Notes (Signed)
Dr. [name] made aware of Calcium 6.3. Per Dr. [name], will recheck labs next week. Pt instructed to take calcium supplements at home.

## 2016-10-24 NOTE — Progress Notes (Signed)
Patient here for follow up. Patient has recently moved to [place]. Her heart rate was 46 today. Instructed family to notify [facility] as patient is on digoxin.

## 2016-10-25 LAB — TGAB+THYROGLOBULIN IMA OR RIA: Thyroglobulin Antibody: <1 IU/mL (ref 0.0–0.9)

## 2016-10-25 LAB — THYROGLOBULIN BY IMA: Thyroglobulin by IMA: 67.9 ng/mL — ABNORMAL HIGH (ref 1.5–38.5)

## 2016-10-28 DIAGNOSIS — I482 Chronic atrial fibrillation: Secondary | ICD-10-CM | POA: Diagnosis not present

## 2016-10-29 DIAGNOSIS — N184 Chronic kidney disease, stage 4 (severe): Secondary | ICD-10-CM | POA: Diagnosis not present

## 2016-10-31 ENCOUNTER — Ambulatory Visit: Payer: Commercial Managed Care - HMO

## 2016-11-01 DIAGNOSIS — F0280 Dementia in other diseases classified elsewhere without behavioral disturbance: Secondary | ICD-10-CM | POA: Diagnosis not present

## 2016-11-01 DIAGNOSIS — G301 Alzheimer's disease with late onset: Secondary | ICD-10-CM | POA: Diagnosis not present

## 2016-11-01 DIAGNOSIS — R278 Other lack of coordination: Secondary | ICD-10-CM | POA: Diagnosis not present

## 2016-11-01 DIAGNOSIS — I482 Chronic atrial fibrillation: Secondary | ICD-10-CM | POA: Diagnosis not present

## 2016-11-04 ENCOUNTER — Ambulatory Visit
Admission: RE | Admit: 2016-11-04 | Discharge: 2016-11-04 | Disposition: A | Discharge status: Home / Self Care | Payer: Medicare HMO | Source: Ambulatory Visit | Attending: Oncology | Admitting: Oncology

## 2016-11-04 ENCOUNTER — Ambulatory Visit: Payer: Commercial Managed Care - HMO | Admitting: Oncology

## 2016-11-04 ENCOUNTER — Other Ambulatory Visit: Payer: Commercial Managed Care - HMO

## 2016-11-04 DIAGNOSIS — I517 Cardiomegaly: Secondary | ICD-10-CM | POA: Insufficient documentation

## 2016-11-04 DIAGNOSIS — I700 Atherosclerosis of aorta: Secondary | ICD-10-CM | POA: Diagnosis not present

## 2016-11-04 DIAGNOSIS — Z8781 Personal history of (healed) traumatic fracture: Secondary | ICD-10-CM | POA: Diagnosis not present

## 2016-11-04 DIAGNOSIS — J90 Pleural effusion, not elsewhere classified: Secondary | ICD-10-CM | POA: Insufficient documentation

## 2016-11-04 DIAGNOSIS — J9811 Atelectasis: Secondary | ICD-10-CM | POA: Insufficient documentation

## 2016-11-04 DIAGNOSIS — C73 Malignant neoplasm of thyroid gland: Secondary | ICD-10-CM | POA: Diagnosis not present

## 2016-11-04 DIAGNOSIS — I2510 Atherosclerotic heart disease of native coronary artery without angina pectoris: Secondary | ICD-10-CM | POA: Insufficient documentation

## 2016-11-04 DIAGNOSIS — N184 Chronic kidney disease, stage 4 (severe): Secondary | ICD-10-CM | POA: Diagnosis not present

## 2016-11-04 DIAGNOSIS — R918 Other nonspecific abnormal finding of lung field: Secondary | ICD-10-CM | POA: Diagnosis not present

## 2016-11-04 LAB — GLUCOSE, CAPILLARY: Glucose-Capillary: 95 mg/dL (ref 65–99)

## 2016-11-04 MED ORDER — FLUDEOXYGLUCOSE F - 18 (FDG) INJECTION
11.7600 | Freq: Once | INTRAVENOUS | Status: AC | PRN
  Administered 2016-11-04: 11.76 via INTRAVENOUS

## 2016-11-06 ENCOUNTER — Ambulatory Visit: Payer: Commercial Managed Care - HMO | Admitting: Oncology

## 2016-11-06 ENCOUNTER — Other Ambulatory Visit: Payer: Commercial Managed Care - HMO

## 2016-11-11 NOTE — Progress Notes (Signed)
[place]  Telephone:(336) [phone]  Fax:(336) [phone]     Vanessa Hancock DOB: Dec 20, 1931  MR#: [id number]  UUV#:[id number]  Patient Care Team: [name], MD as PCP - General (Internal Medicine)  CHIEF COMPLAINT: Thyroid cancer  INTERVAL HISTORY: Patient returns to clinic today for further evaluation and discussion of her imaging results. She continues to have a decreased performance status and she currently resides in assisted living. She is accompanied by her daughters who gives most of the history.  She has no new neurologic complaints. She denies any recent fevers. She denies any chest pain, shortness of breath, cough, or hemoptysis. She has no nausea, vomiting, constipation, or diarrhea. She has no urinary complaints. Patient otherwise feels well and offers no further specific complaints.  REVIEW OF SYSTEMS:   Review of Systems  Constitutional: Positive for malaise/fatigue. Negative for fever and weight loss.  HENT: Negative.   Respiratory: Negative.  Negative for cough, hemoptysis and shortness of breath.   Cardiovascular: Negative.  Negative for chest pain and leg swelling.  Gastrointestinal: Negative.  Negative for abdominal pain.  Genitourinary: Negative.   Musculoskeletal: Negative.   Neurological: Positive for weakness.  Psychiatric/Behavioral: Positive for memory loss. The patient is not nervous/anxious.     As per HPI. Otherwise, a complete review of systems is negative.  ONCOLOGY HISTORY: Oncology History   83 year old female with right vocal cord paralysis and recurrent papillary thyroid cancer biopsy proven patient had diagnosis of thyroid cancer in 2004 had thyroidectomy followed by radioactive iodine treatment at  [facility].  initial diagnosis of papillary thyroid carcinoma  pT3 pN2 status post thyroidectomy in 2004 Recent endobronchial ultrasound-guided biopsies positive for papillary carcinoma 2.palliative radiation therapy  started in May of 2014 3.patient has finished 7000 rads to the laryngeal area in June of 2014     Recurrent thyroid cancer [facility])    PAST MEDICAL HISTORY: Past Medical History:  Diagnosis Date  . A-fib ([place])   . Alzheimer disease    early stages (2016)  . Cancer ([place])    Thyroid  . CHF (congestive heart failure) ([place])   . COPD (chronic obstructive pulmonary disease) ([place])   . Macular degeneration   . Recurrent thyroid cancer ([place]) 03/28/2015  . Sleep apnea     PAST SURGICAL HISTORY: Past Surgical History:  Procedure Laterality Date  . APPENDECTOMY    . THYROIDECTOMY    . TONSILLECTOMY    . vericose vein      FAMILY HISTORY History reviewed. No pertinent family history.    ADVANCED DIRECTIVES:    HEALTH MAINTENANCE: Social History  Substance Use Topics  . Smoking status: Former Research scientist (life sciences)  . Smokeless tobacco: Never Used  . Alcohol use No    No Known Allergies  Current Outpatient Prescriptions  Medication Sig Dispense Refill  . acetaminophen (TYLENOL) 325 MG tablet Take 325 mg by mouth every 6 (six) hours as needed for mild pain, moderate pain or fever.     Marland Kitchen ADVAIR DISKUS 250-50 MCG/DOSE AEPB Inhale 1 puff into the lungs every 12 (twelve) hours. Reported on 04/09/2016    . alendronate (FOSAMAX) 70 MG tablet TAKE (1) ONCE A WEEK 30 MINUTES BEFORE BREAKFAST WITH FULL GLASS OF WA TER. DO NOT LIE DOWN AFTER.    . ARIPiprazole (ABILIFY) 2 MG tablet Take 2 mg by mouth daily.     Marland Kitchen aspirin EC 81 MG tablet TAKE 1 TABLET BY MOUTH ONCE DAILY.    Marland Kitchen atorvastatin (LIPITOR) 20  MG tablet TAKE 1 TABLET BY MOUTH ONCE DAILY FOR CHOLESTEROL    . calcitRIOL (ROCALTROL) 0.25 MCG capsule TAKE (1) CAPSULE BY MOUTH TWICE DAILY.    . calcium carbonate (TUMS - DOSED IN MG ELEMENTAL CALCIUM) 500 MG chewable tablet Chew 2 tablets by mouth 3 (three) times daily.    . clopidogrel (PLAVIX) 75 MG tablet     . digoxin (DIGOX) 0.125 MG tablet TAKE 1 TABLET BY MOUTH ONCE DAILY.    Marland Kitchen docusate sodium  (COLACE) 100 MG capsule TAKE (1) CAPSULE BY MOUTH TWICE DAILY.    Ruthe Mannan 100-5 MCG/ACT AERO Reported on 04/09/2016    . enalapril (VASOTEC) 2.5 MG tablet TAKE (1) TABLET BY MOUTH DAILY FOR HIGH BLOOD PRESSURE.    . ergocalciferol (VITAMIN D2) 50000 units capsule Take 50,000 Units by mouth once a week. Give on Monday    . escitalopram (LEXAPRO) 20 MG tablet Take 20 mg by mouth daily.     . ferrous sulfate 325 (65 FE) MG tablet TAKE 1 TABLET BY MOUTH ONCE DAILY.    . fluticasone (FLONASE) 50 MCG/ACT nasal spray Place into the nose.    . fluticasone furoate-vilanterol (BREO ELLIPTA) 100-25 MCG/INH AEPB Inhale 1 puff into the lungs.    . furosemide (LASIX) 20 MG tablet Take 20 mg by mouth 2 (two) times daily.    Marland Kitchen ipratropium-albuterol (DUONEB) 0.5-2.5 (3) MG/3ML SOLN Take 3 mLs by nebulization every 4 (four) hours as needed (wheezing).    Marland Kitchen levothyroxine (SYNTHROID, LEVOTHROID) 137 MCG tablet TAKE 1 TABLET BY MOUTH ONCE DAILY.    . metoprolol tartrate (LOPRESSOR) 25 MG tablet TAKE 1 TABLET BY MOUTH TWICE DAILY    . Multiple Vitamins-Minerals (I-VITE) TABS TAKE 1 TABLET BY MOUTH ONCE DAILY.    Marland Kitchen omeprazole (PRILOSEC) 20 MG capsule Take 20 mg by mouth daily.    Marland Kitchen oxyCODONE-acetaminophen (PERCOCET/ROXICET) 5-325 MG tablet Take by mouth every 4 (four) hours as needed for severe pain.    . polyethylene glycol (MIRALAX / GLYCOLAX) packet Take 17 packets by mouth daily. Mix with 8 oz of fluid.    . potassium chloride SA (K-DUR,KLOR-CON) 20 MEQ tablet TAKE 1 TABLET BY MOUTH ONCE DAILY.    Marland Kitchen senna (SENOKOT) 8.6 MG tablet Take 1 tablet by mouth daily.    Marland Kitchen tiotropium (SPIRIVA HANDIHALER) 18 MCG inhalation capsule INHALE 1 CAPSULE AS DIRECTED ONCE DAILY.    Marland Kitchen predniSONE (DELTASONE) 10 MG tablet Take 10 mg by mouth daily with breakfast. 50m x 3, 348mx 3, 20115m 3, 15m59m3     No current facility-administered medications for this visit.     OBJECTIVE: BP 105/63 (BP Location: Right Arm, Patient Position:  Sitting)   Pulse 97   Temp 98.6 F (37 C) (Tympanic)   Wt 165 lb 10.8 oz (75.1 kg)   BMI 25.19 kg/m    Body mass index is 25.19 kg/m.    ECOG FS:1 - Symptomatic but completely ambulatory  General: Well-developed, well-nourished, no acute distress. Eyes: Pink conjunctiva, anicteric sclera. HEENT: Normocephalic, moist mucous membranes, clear oropharnyx. No palpable lymphadenopathy. Lungs: Clear to auscultation bilaterally. Heart: Irregular rate and rhythm, history afib. No rubs, murmurs, or gallops. Musculoskeletal: No edema, cyanosis, or clubbing. Neuro: Alert, answering all questions appropriately. Cranial nerves grossly intact. Skin: No rashes or petechiae noted. Psych: Normal affect.  LAB RESULTS:  Appointment on 11/12/2016  Component Date Value Ref Range Status  . Sodium 11/12/2016 135  135 - 145 mmol/L Final  .  Potassium 11/12/2016 4.5  3.5 - 5.1 mmol/L Final  . Chloride 11/12/2016 100* 101 - 111 mmol/L Final  . CO2 11/12/2016 26  22 - 32 mmol/L Final  . Glucose, Bld 11/12/2016 149* 65 - 99 mg/dL Final  . BUN 11/12/2016 35* 6 - 20 mg/dL Final  . Creatinine, Ser 11/12/2016 3.45* 0.44 - 1.00 mg/dL Final  . Calcium 11/12/2016 7.7* 8.9 - 10.3 mg/dL Final  . GFR calc non Af Amer 11/12/2016 11* >60 mL/min Final  . GFR calc Af Amer 11/12/2016 13* >60 mL/min Final   Comment: (NOTE) The eGFR has been calculated using the CKD EPI equation. This calculation has not been validated in all clinical situations. eGFR's persistently <60 mL/min signify possible Chronic Kidney Disease.   . Anion gap 11/12/2016 9  5 - 15 Final  . WBC 11/12/2016 4.9  3.6 - 11.0 K/uL Final  . RBC 11/12/2016 3.12* 3.80 - 5.20 MIL/uL Final  . Hemoglobin 11/12/2016 7.9* 12.0 - 16.0 g/dL Final  . HCT 11/12/2016 24.4* 35.0 - 47.0 % Final  . MCV 11/12/2016 78.0* 80.0 - 100.0 fL Final  . MCH 11/12/2016 25.3* 26.0 - 34.0 pg Final  . MCHC 11/12/2016 32.5  32.0 - 36.0 g/dL Final  . RDW 11/12/2016 16.8* 11.5 - 14.5  % Final  . Platelets 11/12/2016 320  150 - 440 K/uL Final  . Neutrophils Relative % 11/12/2016 70  % Final  . Neutro Abs 11/12/2016 3.5  1.4 - 6.5 K/uL Final  . Lymphocytes Relative 11/12/2016 18  % Final  . Lymphs Abs 11/12/2016 0.9* 1.0 - 3.6 K/uL Final  . Monocytes Relative 11/12/2016 8  % Final  . Monocytes Absolute 11/12/2016 0.4  0.2 - 0.9 K/uL Final  . Eosinophils Relative 11/12/2016 3  % Final  . Eosinophils Absolute 11/12/2016 0.1  0 - 0.7 K/uL Final  . Basophils Relative 11/12/2016 1  % Final  . Basophils Absolute 11/12/2016 0.1  0 - 0.1 K/uL Final  . Ferritin 11/12/2016 395* 11 - 307 ng/mL Final  . Iron 11/12/2016 29  28 - 170 ug/dL Final  . TIBC 11/12/2016 114* 250 - 450 ug/dL Final  . Saturation Ratios 11/12/2016 25  10.4 - 31.8 % Final  . UIBC 11/12/2016 85  ug/dL Final  . Folate 11/12/2016 7.6  >5.9 ng/mL Final  . Vitamin B-12 11/12/2016 777  180 - 914 pg/mL Final   Comment: (NOTE) This assay is not validated for testing neonatal or myeloproliferative syndrome specimens for Vitamin B12 levels. Performed at [facility], [place] [address] [place], [place] [address]   . LDH 11/12/2016 208* 98 - 192 U/L Final  . Haptoglobin 11/12/2016 319* 34 - 200 mg/dL Final   Comment: (NOTE) Performed At: [place], [place] [id number] Lindon Romp MD ZW:[id number]     STUDIES: No results found.  ASSESSMENT:  Thyroid cancer.  PLAN:    1. Thyroid cancer: PET scan results from November 04, 2016 reviewed independently confirming likely progression of disease. Patient also has a significantly increased thyroid globulin level of 67.9. Her thyroid globulin antibodies are negative. Given patient's advanced age and declining performance status, she has climbed any additional systemic treatment, but could consider oral lenvatinib or sorafenib if desired in the future. Patient wishes to continue simple observation therefore will have a CT scan in  3 months with follow-up 1-2 days later. 2. Thyroid panel: Patient's TSH is elevated at 13.4, continue current dose of Synthroid. Results  previously forwarded to primary care. 3. Hypocalcemia: Continue oral calcium supplementation. 4. Anemia: Patient's hemoglobin is trending down. Iron stores, B 12, folate, and hemolysis labs are all either negative or within normal limits. Continue to monitor.   Approximately 30 minutes was spent in discussion of which greater than 50% was consultation.  Patient expressed understanding and was in agreement with this plan. She also understands that She can call clinic at any time with any questions, concerns, or complaints.     Lloyd Huger, MD   11/13/2016 5:59 PM

## 2016-11-12 ENCOUNTER — Other Ambulatory Visit: Payer: Self-pay | Admitting: *Deleted

## 2016-11-12 ENCOUNTER — Encounter: Payer: Self-pay | Admitting: Oncology

## 2016-11-12 ENCOUNTER — Inpatient Hospital Stay (HOSPITAL_BASED_OUTPATIENT_CLINIC_OR_DEPARTMENT_OTHER): Payer: Medicare HMO | Admitting: Oncology

## 2016-11-12 ENCOUNTER — Inpatient Hospital Stay: Payer: Medicare HMO

## 2016-11-12 VITALS — BP 105/63 | HR 97 | Temp 98.6°F | Wt 165.7 lb

## 2016-11-12 DIAGNOSIS — F0280 Dementia in other diseases classified elsewhere without behavioral disturbance: Secondary | ICD-10-CM

## 2016-11-12 DIAGNOSIS — Z87891 Personal history of nicotine dependence: Secondary | ICD-10-CM

## 2016-11-12 DIAGNOSIS — D649 Anemia, unspecified: Secondary | ICD-10-CM | POA: Diagnosis not present

## 2016-11-12 DIAGNOSIS — Z923 Personal history of irradiation: Secondary | ICD-10-CM

## 2016-11-12 DIAGNOSIS — G4730 Sleep apnea, unspecified: Secondary | ICD-10-CM

## 2016-11-12 DIAGNOSIS — J449 Chronic obstructive pulmonary disease, unspecified: Secondary | ICD-10-CM

## 2016-11-12 DIAGNOSIS — G309 Alzheimer's disease, unspecified: Secondary | ICD-10-CM | POA: Diagnosis not present

## 2016-11-12 DIAGNOSIS — Z7982 Long term (current) use of aspirin: Secondary | ICD-10-CM

## 2016-11-12 DIAGNOSIS — R5383 Other fatigue: Secondary | ICD-10-CM | POA: Diagnosis not present

## 2016-11-12 DIAGNOSIS — R531 Weakness: Secondary | ICD-10-CM

## 2016-11-12 DIAGNOSIS — C73 Malignant neoplasm of thyroid gland: Secondary | ICD-10-CM

## 2016-11-12 DIAGNOSIS — I509 Heart failure, unspecified: Secondary | ICD-10-CM | POA: Diagnosis not present

## 2016-11-12 DIAGNOSIS — I4891 Unspecified atrial fibrillation: Secondary | ICD-10-CM

## 2016-11-12 DIAGNOSIS — Z79899 Other long term (current) drug therapy: Secondary | ICD-10-CM

## 2016-11-12 LAB — LACTATE DEHYDROGENASE: LDH: 208 U/L — ABNORMAL HIGH (ref 98–192)

## 2016-11-12 LAB — BASIC METABOLIC PANEL
ANION GAP: 9 (ref 5–15)
BUN: 35 mg/dL — ABNORMAL HIGH (ref 6–20)
CALCIUM: 7.7 mg/dL — AB (ref 8.9–10.3)
CO2: 26 mmol/L (ref 22–32)
CREATININE: 3.45 mg/dL — AB (ref 0.44–1.00)
Chloride: 100 mmol/L — ABNORMAL LOW (ref 101–111)
GFR, EST AFRICAN AMERICAN: 13 mL/min — AB (ref >60)
GFR, EST NON AFRICAN AMERICAN: 11 mL/min — AB (ref >60)
GLUCOSE: 149 mg/dL — AB (ref 65–99)
Potassium: 4.5 mmol/L (ref 3.5–5.1)
Sodium: 135 mmol/L (ref 135–145)

## 2016-11-12 LAB — IRON AND TIBC
IRON: 29 ug/dL (ref 28–170)
Saturation Ratios: 25 % (ref 10.4–31.8)
TIBC: 114 ug/dL — AB (ref 250–450)
UIBC: 85 ug/dL

## 2016-11-12 LAB — CBC WITH DIFFERENTIAL/PLATELET
BASOS ABS: 0.1 10*3/uL (ref 0–0.1)
BASOS PCT: 1 %
EOS ABS: 0.1 10*3/uL (ref 0–0.7)
EOS PCT: 3 %
HEMATOCRIT: 24.4 % — AB (ref 35.0–47.0)
Hemoglobin: 7.9 g/dL — ABNORMAL LOW (ref 12.0–16.0)
Lymphocytes Relative: 18 %
Lymphs Abs: 0.9 10*3/uL — ABNORMAL LOW (ref 1.0–3.6)
MCH: 25.3 pg — ABNORMAL LOW (ref 26.0–34.0)
MCHC: 32.5 g/dL (ref 32.0–36.0)
MCV: 78 fL — ABNORMAL LOW (ref 80.0–100.0)
MONO ABS: 0.4 10*3/uL (ref 0.2–0.9)
Monocytes Relative: 8 %
NEUTROS ABS: 3.5 10*3/uL (ref 1.4–6.5)
Neutrophils Relative %: 70 %
PLATELETS: 320 10*3/uL (ref 150–440)
RBC: 3.12 MIL/uL — ABNORMAL LOW (ref 3.80–5.20)
RDW: 16.8 % — AB (ref 11.5–14.5)
WBC: 4.9 10*3/uL (ref 3.6–11.0)

## 2016-11-12 LAB — VITAMIN B12: Vitamin B-12: 777 pg/mL (ref 180–914)

## 2016-11-12 LAB — FOLATE: Folate: 7.6 ng/mL (ref >5.9)

## 2016-11-12 LAB — FERRITIN: FERRITIN: 395 ng/mL — AB (ref 11–307)

## 2016-11-13 LAB — HAPTOGLOBIN: Haptoglobin: 319 mg/dL — ABNORMAL HIGH (ref 34–200)

## 2016-11-19 DIAGNOSIS — F329 Major depressive disorder, single episode, unspecified: Secondary | ICD-10-CM | POA: Diagnosis not present

## 2016-11-19 DIAGNOSIS — I481 Persistent atrial fibrillation: Secondary | ICD-10-CM | POA: Diagnosis not present

## 2016-11-19 DIAGNOSIS — G301 Alzheimer's disease with late onset: Secondary | ICD-10-CM | POA: Diagnosis not present

## 2016-11-19 DIAGNOSIS — J438 Other emphysema: Secondary | ICD-10-CM | POA: Diagnosis not present

## 2016-11-19 DIAGNOSIS — C73 Malignant neoplasm of thyroid gland: Secondary | ICD-10-CM | POA: Diagnosis not present

## 2016-11-19 DIAGNOSIS — I5032 Chronic diastolic (congestive) heart failure: Secondary | ICD-10-CM | POA: Diagnosis not present

## 2016-12-03 DIAGNOSIS — S5012XA Contusion of left forearm, initial encounter: Secondary | ICD-10-CM | POA: Diagnosis not present

## 2016-12-13 DIAGNOSIS — Z85850 Personal history of malignant neoplasm of thyroid: Secondary | ICD-10-CM | POA: Diagnosis not present

## 2016-12-13 DIAGNOSIS — I4891 Unspecified atrial fibrillation: Secondary | ICD-10-CM | POA: Diagnosis not present

## 2016-12-13 DIAGNOSIS — I5030 Unspecified diastolic (congestive) heart failure: Secondary | ICD-10-CM | POA: Diagnosis not present

## 2016-12-13 DIAGNOSIS — J069 Acute upper respiratory infection, unspecified: Secondary | ICD-10-CM | POA: Diagnosis not present

## 2016-12-13 DIAGNOSIS — F329 Major depressive disorder, single episode, unspecified: Secondary | ICD-10-CM | POA: Diagnosis not present

## 2016-12-13 DIAGNOSIS — N184 Chronic kidney disease, stage 4 (severe): Secondary | ICD-10-CM | POA: Diagnosis not present

## 2016-12-13 DIAGNOSIS — G309 Alzheimer's disease, unspecified: Secondary | ICD-10-CM | POA: Diagnosis not present

## 2016-12-20 ENCOUNTER — Encounter: Payer: Self-pay | Admitting: *Deleted

## 2017-01-14 DIAGNOSIS — N184 Chronic kidney disease, stage 4 (severe): Secondary | ICD-10-CM | POA: Diagnosis not present

## 2017-01-14 DIAGNOSIS — J438 Other emphysema: Secondary | ICD-10-CM | POA: Diagnosis not present

## 2017-01-14 DIAGNOSIS — I5032 Chronic diastolic (congestive) heart failure: Secondary | ICD-10-CM | POA: Diagnosis not present

## 2017-01-14 DIAGNOSIS — Z85850 Personal history of malignant neoplasm of thyroid: Secondary | ICD-10-CM | POA: Diagnosis not present

## 2017-01-14 DIAGNOSIS — J9611 Chronic respiratory failure with hypoxia: Secondary | ICD-10-CM | POA: Diagnosis not present

## 2017-01-14 DIAGNOSIS — G301 Alzheimer's disease with late onset: Secondary | ICD-10-CM | POA: Diagnosis not present

## 2017-01-14 DIAGNOSIS — F332 Major depressive disorder, recurrent severe without psychotic features: Secondary | ICD-10-CM | POA: Diagnosis not present

## 2017-01-14 DIAGNOSIS — I481 Persistent atrial fibrillation: Secondary | ICD-10-CM | POA: Diagnosis not present

## 2017-01-30 DIAGNOSIS — E781 Pure hyperglyceridemia: Secondary | ICD-10-CM | POA: Diagnosis not present

## 2017-01-30 DIAGNOSIS — I482 Chronic atrial fibrillation: Secondary | ICD-10-CM | POA: Diagnosis not present

## 2017-02-10 ENCOUNTER — Other Ambulatory Visit: Payer: Medicare HMO

## 2017-02-10 ENCOUNTER — Ambulatory Visit: Payer: Medicare HMO

## 2017-02-18 ENCOUNTER — Ambulatory Visit: Payer: Medicare HMO | Admitting: Oncology

## 2017-03-21 DIAGNOSIS — J9611 Chronic respiratory failure with hypoxia: Secondary | ICD-10-CM | POA: Diagnosis not present

## 2017-03-21 DIAGNOSIS — N184 Chronic kidney disease, stage 4 (severe): Secondary | ICD-10-CM | POA: Diagnosis not present

## 2017-03-21 DIAGNOSIS — J449 Chronic obstructive pulmonary disease, unspecified: Secondary | ICD-10-CM | POA: Diagnosis not present

## 2017-03-21 DIAGNOSIS — I5030 Unspecified diastolic (congestive) heart failure: Secondary | ICD-10-CM | POA: Diagnosis not present

## 2017-03-21 DIAGNOSIS — G309 Alzheimer's disease, unspecified: Secondary | ICD-10-CM | POA: Diagnosis not present

## 2017-03-21 DIAGNOSIS — I4891 Unspecified atrial fibrillation: Secondary | ICD-10-CM | POA: Diagnosis not present

## 2017-03-21 DIAGNOSIS — C73 Malignant neoplasm of thyroid gland: Secondary | ICD-10-CM | POA: Diagnosis not present

## 2017-03-21 DIAGNOSIS — F323 Major depressive disorder, single episode, severe with psychotic features: Secondary | ICD-10-CM | POA: Diagnosis not present

## 2017-04-25 ENCOUNTER — Ambulatory Visit: Payer: Commercial Managed Care - HMO | Admitting: Radiation Oncology

## 2017-05-09 ENCOUNTER — Ambulatory Visit: Payer: Medicare HMO | Admitting: Radiation Oncology

## 2017-05-15 DIAGNOSIS — I481 Persistent atrial fibrillation: Secondary | ICD-10-CM | POA: Diagnosis not present

## 2017-05-15 DIAGNOSIS — I5032 Chronic diastolic (congestive) heart failure: Secondary | ICD-10-CM | POA: Diagnosis not present

## 2017-05-15 DIAGNOSIS — J439 Emphysema, unspecified: Secondary | ICD-10-CM | POA: Diagnosis not present

## 2017-05-15 DIAGNOSIS — G301 Alzheimer's disease with late onset: Secondary | ICD-10-CM | POA: Diagnosis not present

## 2017-05-15 DIAGNOSIS — C7989 Secondary malignant neoplasm of other specified sites: Secondary | ICD-10-CM | POA: Diagnosis not present

## 2017-05-15 DIAGNOSIS — N184 Chronic kidney disease, stage 4 (severe): Secondary | ICD-10-CM | POA: Diagnosis not present

## 2017-05-15 DIAGNOSIS — F339 Major depressive disorder, recurrent, unspecified: Secondary | ICD-10-CM | POA: Diagnosis not present

## 2017-05-15 DIAGNOSIS — J9611 Chronic respiratory failure with hypoxia: Secondary | ICD-10-CM | POA: Diagnosis not present

## 2017-05-21 DIAGNOSIS — Z9181 History of falling: Secondary | ICD-10-CM | POA: Diagnosis not present

## 2017-05-22 DIAGNOSIS — Z9181 History of falling: Secondary | ICD-10-CM | POA: Diagnosis not present

## 2017-05-26 DIAGNOSIS — Z9181 History of falling: Secondary | ICD-10-CM | POA: Diagnosis not present

## 2017-05-28 DIAGNOSIS — Z9181 History of falling: Secondary | ICD-10-CM | POA: Diagnosis not present

## 2017-05-30 DIAGNOSIS — Z9181 History of falling: Secondary | ICD-10-CM | POA: Diagnosis not present

## 2017-06-02 DIAGNOSIS — Z9181 History of falling: Secondary | ICD-10-CM | POA: Diagnosis not present

## 2017-06-04 DIAGNOSIS — Z9181 History of falling: Secondary | ICD-10-CM | POA: Diagnosis not present

## 2017-06-05 DIAGNOSIS — Z9181 History of falling: Secondary | ICD-10-CM | POA: Diagnosis not present

## 2017-06-09 DIAGNOSIS — Z9181 History of falling: Secondary | ICD-10-CM | POA: Diagnosis not present

## 2017-06-11 DIAGNOSIS — Z9181 History of falling: Secondary | ICD-10-CM | POA: Diagnosis not present

## 2017-06-13 DIAGNOSIS — Z9181 History of falling: Secondary | ICD-10-CM | POA: Diagnosis not present

## 2017-06-16 DIAGNOSIS — Z9181 History of falling: Secondary | ICD-10-CM | POA: Diagnosis not present

## 2017-07-18 DIAGNOSIS — J449 Chronic obstructive pulmonary disease, unspecified: Secondary | ICD-10-CM | POA: Diagnosis not present

## 2017-07-18 DIAGNOSIS — J9611 Chronic respiratory failure with hypoxia: Secondary | ICD-10-CM | POA: Diagnosis not present

## 2017-07-18 DIAGNOSIS — N184 Chronic kidney disease, stage 4 (severe): Secondary | ICD-10-CM | POA: Diagnosis not present

## 2017-07-18 DIAGNOSIS — I5030 Unspecified diastolic (congestive) heart failure: Secondary | ICD-10-CM | POA: Diagnosis not present

## 2017-07-18 DIAGNOSIS — G309 Alzheimer's disease, unspecified: Secondary | ICD-10-CM | POA: Diagnosis not present

## 2017-07-18 DIAGNOSIS — I4891 Unspecified atrial fibrillation: Secondary | ICD-10-CM | POA: Diagnosis not present

## 2017-07-18 DIAGNOSIS — C73 Malignant neoplasm of thyroid gland: Secondary | ICD-10-CM | POA: Diagnosis not present

## 2017-07-18 DIAGNOSIS — F339 Major depressive disorder, recurrent, unspecified: Secondary | ICD-10-CM | POA: Diagnosis not present

## 2017-08-10 ENCOUNTER — Emergency Department: Payer: Medicare HMO

## 2017-08-10 ENCOUNTER — Emergency Department
Admission: EM | Admit: 2017-08-10 | Discharge: 2017-08-10 | Disposition: A | Discharge status: Home / Self Care | Payer: Medicare HMO | Attending: Emergency Medicine | Admitting: Emergency Medicine

## 2017-08-10 ENCOUNTER — Encounter: Payer: Self-pay | Admitting: Emergency Medicine

## 2017-08-10 DIAGNOSIS — I509 Heart failure, unspecified: Secondary | ICD-10-CM | POA: Diagnosis not present

## 2017-08-10 DIAGNOSIS — G309 Alzheimer's disease, unspecified: Secondary | ICD-10-CM | POA: Diagnosis not present

## 2017-08-10 DIAGNOSIS — Z79899 Other long term (current) drug therapy: Secondary | ICD-10-CM | POA: Insufficient documentation

## 2017-08-10 DIAGNOSIS — Z7982 Long term (current) use of aspirin: Secondary | ICD-10-CM | POA: Diagnosis not present

## 2017-08-10 DIAGNOSIS — Z87891 Personal history of nicotine dependence: Secondary | ICD-10-CM | POA: Insufficient documentation

## 2017-08-10 DIAGNOSIS — R404 Transient alteration of awareness: Secondary | ICD-10-CM | POA: Diagnosis not present

## 2017-08-10 DIAGNOSIS — I4891 Unspecified atrial fibrillation: Secondary | ICD-10-CM | POA: Diagnosis not present

## 2017-08-10 DIAGNOSIS — J449 Chronic obstructive pulmonary disease, unspecified: Secondary | ICD-10-CM | POA: Diagnosis not present

## 2017-08-10 DIAGNOSIS — N390 Urinary tract infection, site not specified: Secondary | ICD-10-CM | POA: Insufficient documentation

## 2017-08-10 DIAGNOSIS — R4182 Altered mental status, unspecified: Secondary | ICD-10-CM | POA: Diagnosis not present

## 2017-08-10 DIAGNOSIS — E039 Hypothyroidism, unspecified: Secondary | ICD-10-CM | POA: Diagnosis not present

## 2017-08-10 LAB — URINALYSIS, COMPLETE (UACMP) WITH MICROSCOPIC
Bilirubin Urine: NEGATIVE
Glucose, UA: NEGATIVE mg/dL
Hgb urine dipstick: NEGATIVE
Ketones, ur: NEGATIVE mg/dL
NITRITE: NEGATIVE
PH: 6 (ref 5.0–8.0)
Protein, ur: 30 mg/dL — AB
SPECIFIC GRAVITY, URINE: 1.014 (ref 1.005–1.030)

## 2017-08-10 LAB — DIGOXIN LEVEL

## 2017-08-10 LAB — TROPONIN I

## 2017-08-10 LAB — MAGNESIUM: MAGNESIUM: 2.2 mg/dL (ref 1.7–2.4)

## 2017-08-10 LAB — COMPREHENSIVE METABOLIC PANEL
ALT: 12 U/L — AB (ref 14–54)
ANION GAP: 10 (ref 5–15)
AST: 25 U/L (ref 15–41)
Albumin: 3.9 g/dL (ref 3.5–5.0)
Alkaline Phosphatase: 52 U/L (ref 38–126)
BUN: 42 mg/dL — ABNORMAL HIGH (ref 6–20)
CALCIUM: 10 mg/dL (ref 8.9–10.3)
CHLORIDE: 96 mmol/L — AB (ref 101–111)
CO2: 30 mmol/L (ref 22–32)
CREATININE: 1.8 mg/dL — AB (ref 0.44–1.00)
GFR, EST AFRICAN AMERICAN: 29 mL/min — AB (ref >60)
GFR, EST NON AFRICAN AMERICAN: 25 mL/min — AB (ref >60)
Glucose, Bld: 93 mg/dL (ref 65–99)
Potassium: 4.8 mmol/L (ref 3.5–5.1)
SODIUM: 136 mmol/L (ref 135–145)
Total Bilirubin: 0.6 mg/dL (ref 0.3–1.2)
Total Protein: 7.8 g/dL (ref 6.5–8.1)

## 2017-08-10 LAB — CBC
HCT: 32 % — ABNORMAL LOW (ref 35.0–47.0)
Hemoglobin: 10.5 g/dL — ABNORMAL LOW (ref 12.0–16.0)
MCH: 27 pg (ref 26.0–34.0)
MCHC: 32.8 g/dL (ref 32.0–36.0)
MCV: 82.2 fL (ref 80.0–100.0)
PLATELETS: 205 10*3/uL (ref 150–440)
RBC: 3.89 MIL/uL (ref 3.80–5.20)
RDW: 14.7 % — AB (ref 11.5–14.5)
WBC: 6.3 10*3/uL (ref 3.6–11.0)

## 2017-08-10 MED ORDER — CEPHALEXIN 250 MG PO CAPS: 250.0000 mg | ORAL_CAPSULE | Freq: Two times a day (BID) | ORAL | 0 refills | Status: AC

## 2017-08-10 MED ORDER — CEPHALEXIN 250 MG PO CAPS
250.0000 mg | ORAL_CAPSULE | Freq: Once | ORAL | Status: AC
  Administered 2017-08-10: 250 mg via ORAL
  Filled 2017-08-10: qty 1

## 2017-08-10 NOTE — ED Notes (Signed)
Pt resting in bed, resp even and unlabored, denies any needs

## 2017-08-10 NOTE — ED Notes (Signed)
Pt placed on 2L [place], pt wears home 02

## 2017-08-10 NOTE — Discharge Instructions (Signed)

## 2017-08-10 NOTE — ED Provider Notes (Signed)
[facility] Emergency Department Provider Note   ____________________________________________   First MD Initiated Contact with Patient 08/10/17 1307     (approximate)  I have reviewed the triage vital signs and the nursing notes.   HISTORY  Chief Complaint Altered Mental Status  EM caveat: Patient reports she does not know why she is here and cannot remember  HPI Vanessa Hancock is a 83 y.o. female brought by EMS for report and change in mental status.  Evidently patient was up washing dishes, then about 15 minutes later nurse that her facility noticed that she was lethargic, was falling asleep while trying to sit up in a wheelchair.  Reports she was arousable throughout, but seemed very somnolent.  EMS was called because they are concerned about a rapid change in her level of alertness.  Her blood glucose was checked and normal.  EMS reports normal vital signs and the patient is alert and oriented for them.  No complaints.  Patient reports that she has no complaints.  She denies any weakness, headache, chest pain trouble breathing numbness tingling or trouble speaking.  She denies any concerns or pain at present.   Past Medical History:  Diagnosis Date  . A-fib ([place])   . Alzheimer disease    early stages (2016)  . Cancer ([place])    Thyroid  . CHF (congestive heart failure) ([place])   . COPD (chronic obstructive pulmonary disease) ([place])   . Macular degeneration   . Recurrent thyroid cancer ([place]) 03/28/2015  . Sleep apnea     Patient Active Problem List   Diagnosis Date Noted  . Incontinence of urine in female 04/15/2016  . Late onset Alzheimer's disease without behavioral disturbance 11/16/2015  . Malignant neoplasm of thyroid gland ([place]) 04/10/2015  . Recurrent thyroid cancer ([place]) 03/28/2015  . Atrial fibrillation, chronic ([place]) 04/07/2014  . Chronic depression 04/07/2014  . CAFL (chronic airflow limitation) ([place]) 04/07/2014  . History of diastolic  dysfunction 35/00/9381  . Hypothyroidism 04/07/2014  . Obstructive sleep apnea 04/07/2014  . Chronic a-fib ([place]) 04/07/2014  . COPD (chronic obstructive pulmonary disease) ([place]) 04/07/2014    Past Surgical History:  Procedure Laterality Date  . APPENDECTOMY    . THYROIDECTOMY    . TONSILLECTOMY    . vericose vein      Prior to Admission medications   Medication Sig Start Date End Date Taking? Authorizing Provider  acetaminophen (TYLENOL) 500 MG tablet Take 1,000 mg every 8 (eight) hours as needed by mouth for mild pain, moderate pain or fever.    Yes [provider]  aspirin EC 81 MG tablet TAKE 1 TABLET BY MOUTH ONCE DAILY. 09/09/14  Yes [provider]  calcitRIOL (ROCALTROL) 0.25 MCG capsule TAKE (1) CAPSULE BY MOUTH TWICE DAILY. 09/09/14  Yes [provider]  calcium carbonate (TUMS - DOSED IN MG ELEMENTAL CALCIUM) 500 MG chewable tablet Chew 2 tablets by mouth 3 (three) times daily.   Yes [provider]  escitalopram (LEXAPRO) 20 MG tablet Take 20 mg by mouth daily.    Yes [provider]  ferrous sulfate 325 (65 FE) MG tablet TAKE 1 TABLET BY MOUTH ONCE DAILY. 11/09/14  Yes [provider]  fluticasone furoate-vilanterol (BREO ELLIPTA) 100-25 MCG/INH AEPB Inhale 1 puff into the lungs.   Yes [provider]  furosemide (LASIX) 20 MG tablet Take 20 mg daily as needed by mouth for edema.    Yes [provider]  levothyroxine ([place], [place]) 137  MCG tablet TAKE 1 TABLET BY MOUTH ONCE DAILY. 09/09/14  Yes [provider]  metoprolol succinate (TOPROL-XL) 25 MG 24 hr tablet Take 25 mg daily by mouth.   Yes [provider]  polyethylene glycol (MIRALAX / GLYCOLAX) packet Take 17 packets by mouth daily. Mix with 8 oz of fluid.   Yes [provider]  senna (SENOKOT) 8.6 MG tablet Take 2 tablets daily by mouth.    Yes [provider]  tiotropium (SPIRIVA HANDIHALER) 18 MCG  inhalation capsule INHALE 1 CAPSULE AS DIRECTED ONCE DAILY. 10/10/14  Yes [provider]  cephALEXin (KEFLEX) 250 MG capsule Take 1 capsule (250 mg total) 2 (two) times daily for 7 days by mouth. 08/10/17 08/17/17  Delman Kitten, MD    Allergies Patient has no known allergies.  No family history on file.  Social History Social History   Tobacco Use  . Smoking status: Former Research scientist (life sciences)  . Smokeless tobacco: Never Used  Substance Use Topics  . Alcohol use: No    Alcohol/week: 0.0 oz  . Drug use: Not on file    Review of Systems -conducted, but may not be reliable given the patient's poor ability to recall Constitutional: No fever, denies feeling weak Eyes: No visual changes. Cardiovascular: Denies chest pain. Respiratory: Denies shortness of breath. Gastrointestinal: No abdominal pain.  No nausea, no vomiting.   Genitourinary: Negative for dysuria. Musculoskeletal: Negative for back pain. Skin: Negative for rash. Neurological: Negative for headaches, weakness or numbness.    ____________________________________________   PHYSICAL EXAM:  VITAL SIGNS: ED Triage Vitals [08/10/17 1303]  Enc Vitals Group     BP      Pulse      Resp      Temp      Temp src      SpO2      Weight 160 lb (72.6 kg)     Height 5\' 5"  (1.651 m)     Head Circumference      Peak Flow      Pain Score      Pain Loc      Pain Edu?      Excl. in [place]?     Constitutional: Alert and oriented to year, date, month, but does not recall why she is here. Well appearing and in no acute distress. Eyes: Conjunctivae are normal. Head: Atraumatic. Nose: No congestion/rhinnorhea. Mouth/Throat: Mucous membranes are moist. Neck: No stridor.   Cardiovascular: Normal rate, regular rhythm. Grossly normal heart sounds.  Good peripheral circulation. Respiratory: Normal respiratory effort.  No retractions. Lungs CTAB. Gastrointestinal: Soft and nontender. No distention. Musculoskeletal: No lower extremity  tenderness nor edema. Neurologic:  Normal speech and language. No gross focal neurologic deficits are appreciated.  No pronator drift.  5 out of 5 strength in all extremities.  No sensory deficits.  Normal and clear speech. Skin:  Skin is warm, dry and intact. No rash noted. Psychiatric: Mood and affect are slightly flat. Speech and behavior are normal.  ____________________________________________   LABS (all labs ordered are listed, but only abnormal results are displayed)  Labs Reviewed  CBC - Abnormal; Notable for the following components:      Result Value   Hemoglobin 10.5 (*)    HCT 32.0 (*)    RDW 14.7 (*)    All other components within normal limits  COMPREHENSIVE METABOLIC PANEL - Abnormal; Notable for the following components:   Chloride 96 (*)    BUN 42 (*)    Creatinine,  Ser 1.80 (*)    ALT 12 (*)    GFR calc non Af Amer 25 (*)    GFR calc Af Amer 29 (*)    All other components within normal limits  BLOOD GAS, VENOUS - Abnormal; Notable for the following components:   Bicarbonate 33.7 (*)    Acid-Base Excess 8.0 (*)    All other components within normal limits  URINALYSIS, COMPLETE (UACMP) WITH MICROSCOPIC - Abnormal; Notable for the following components:   Color, Urine YELLOW (*)    APPearance CLOUDY (*)    Protein, ur 30 (*)    Leukocytes, UA LARGE (*)    Bacteria, UA RARE (*)    Squamous Epithelial / LPF 0-5 (*)    All other components within normal limits  DIGOXIN LEVEL - Abnormal; Notable for the following components:   Digoxin Level <0.2 (*)    All other components within normal limits  URINE CULTURE  TROPONIN I  MAGNESIUM   ____________________________________________  EKG  Reviewed and interpreted by me at 1310 Heart rate 80 QRS 90 QTC 560 QT 400 by computer read, but my evaluation I suspect the QT interval is actually shorter with a QT of about 400 notable on V1 and V3  ____________________________________________  RADIOLOGY  Dg Chest 2  View  Result Date: 08/10/2017 CLINICAL DATA:  Change in mental status EXAM: CHEST  2 VIEW COMPARISON:  08/25/2016 FINDINGS: Cardiomegaly. There is hyperinflation of the lungs compatible with COPD. Chronic interstitial prominence throughout the lungs, favor chronic interstitial lung disease. Bibasilar scarring. No effusions or acute bony abnormality. IMPRESSION: Cardiomegaly, COPD.  Chronic changes. Chronic interstitial prominence throughout the lungs, favor chronic interstitial lung disease. Electronically Signed   By: Rolm Baptise M.D.   On: 08/10/2017 13:45   Ct Head Wo Contrast  Result Date: 08/10/2017 CLINICAL DATA:  Sudden change in mental status, lethargy. History of dementia. EXAM: CT HEAD WITHOUT CONTRAST TECHNIQUE: Contiguous axial images were obtained from the base of the skull through the vertex without intravenous contrast. COMPARISON:  Head CT dated 12/28/2012.  Brain MRI dated 08/18/2013. FINDINGS: Brain: Generalized age related parenchymal atrophy with commensurate dilatation of the ventricles and sulci. Chronic small vessel ischemic changes within the bilateral periventricular white matter regions. No mass, hemorrhage, edema or other evidence of acute parenchymal abnormality. No extra-axial hemorrhage. Vascular: There are chronic calcified atherosclerotic changes of the large vessels at the skull base. No unexpected hyperdense vessel. Skull: Normal. Negative for fracture or focal lesion. Sinuses/Orbits: No acute finding. Other: None. IMPRESSION: 1. No acute findings.  No intracranial mass, hemorrhage or edema. 2. Atrophy and chronic small vessel ischemic changes in the white matter. Electronically Signed   By: Franki Cabot M.D.   On: 08/10/2017 13:48    CT results reviewed, no acute findings.  Chest x-ray reviewed, cardiomegaly, COPD-like findings but no acute. ____________________________________________   PROCEDURES  Procedure(s) performed: None  Procedures  Critical Care  performed: No  ____________________________________________   INITIAL IMPRESSION / ASSESSMENT AND PLAN / ED COURSE  Pertinent labs & imaging results that were available during my care of the patient were reviewed by me and considered in my medical decision making (see chart for details).  Patient presents for evaluation of change in mental status, notably became more fatigued or lethargic.  She is alert, oriented in no distress with no complaint at present and no focal abnormality noted on neurologic exam.  Lungs are clear, heart tones normal, following all commands and seemingly well oriented.  Clinical Course as of Aug 10 1514  Sun Aug 10, 2017  1307 Called listed emerg. Contact Izora Gala Barrett), currently on her way to [facility].   [MQ]  1356 GFR, Est Non African American: (!) 25 [MQ]    Clinical Course User Index [MQ] Delman Kitten, MD   EKG does not demonstrate acute ischemic change, QT appears slightly slurred due to either believe the baseline, but does not exterior to be grossly prolonged.  We will check her labs, including electrolytes, evaluate with head CT for any evidence of an acute neurologic etiology though she does not exhibit symptoms of stroke.  Metabolic workup, chest x-ray, and also obtain a VBG to evaluate if the patient may be retaining.  However, to my exam she seems to be alert, oriented and no evidence of lethargy at this time.  No clear explanation to explain the patient's symptoms earlier, but appears to be improved now.  No report of syncope.  ____________________________________________ ----------------------------------------- 3:15 PM on 08/10/2017 -----------------------------------------  Patient resting comfortably with her daughter at bedside.  Family reports that she seems to be acting normally, and the patient has no ongoing complaints.  She may have a urinary tract infection, and family reports I think she may have just potentially been falling asleep in the  wheelchair and she is acting normally now.  At this point I have no evidence to support an acute emergent condition, her mental status appears to be back to his baseline, and I will culture her urine and treat her for possible UTI.  She will be able to follow-up with the doctors at [facility] where she resides.  FINAL CLINICAL IMPRESSION(S) / ED DIAGNOSES  Final diagnoses:  Urinary tract infection, acute      NEW MEDICATIONS STARTED DURING THIS VISIT:  This SmartLink is deprecated. Use AVSMEDLIST instead to display the medication list for a patient.   Note:  This document was prepared using Dragon voice recognition software and may include unintentional dictation errors.     Delman Kitten, MD 08/10/17 [phone]

## 2017-08-10 NOTE — ED Notes (Signed)
Report given to Farley Ly at [facility]

## 2017-08-10 NOTE — ED Triage Notes (Signed)
Patient presents to ED via [facility] from [facility] due to altered mental status. Patient is normally verbal and talkative at baseline. Nurse at [facility] reports sudden change in mental status. Patient was lethargic and falling asleep in her wheelchair. Patient is alert on arrival but remains lethargic. Will follow commands and answer questions. Hx of dementia.

## 2017-08-10 NOTE — ED Notes (Signed)
Son to transport pt with home o2

## 2017-08-11 ENCOUNTER — Telehealth: Payer: Self-pay

## 2017-08-11 NOTE — Telephone Encounter (Signed)
On keflex now for possible UTI Culture still pending

## 2017-08-11 NOTE — Telephone Encounter (Signed)
PLEASE NOTE: All timestamps contained within this report are represented as [place] Standard Time. CONFIDENTIALTY NOTICE: This fax transmission is intended only for the addressee. It contains information that is legally privileged, confidential or otherwise protected from use or disclosure. If you are not the intended recipient, you are strictly prohibited from reviewing, disclosing, copying using or disseminating any of this information or taking any action in reliance on or regarding this information. If you have received this fax in error, please notify us immediately by telephone so that we can arrange for its return to [place]. Phone: [phone], Toll-Free: [phone], Fax: [phone] Page: 1 of 1 Call Id: [id number] [place] Night - Client Nonclinical Telephone Record [place] Night - Client Client Site [place] Physician Viviana Simpler - MD Contact Type Call Call Hanna Page Now Who Is [place] / [place] Name [facility] Name [place] Number [phone] Patient Name Vanessa Hancock Patient DOB 1931-12-25 Reason for Call Symptomatic Patient Initial Comment Caller states is Vanessa Hancock w/[facility], pt is very lethargic, not acting like herself, was fine a few mins ago and falling asleep in her chair. Bp is 110/52. Additional Comment Paging DoctorName Phone DateTime Result/Outcome Message Type Notes Alysia Penna - MD [phone] 08/10/2017 11:51:22 AM Paged On Call Back to Call Center Doctor Paged Please call [phone] [facility] for Vilinda Boehringer - MD 08/10/2017 12:11:42 PM Spoke with On Call - General Message Result Call Closed By: Philis Kendall Transaction Date/Time: 08/10/2017 11:43:19 AM (ET)

## 2017-08-11 NOTE — Telephone Encounter (Signed)
Per chart review tab pt was seen at [facility] ED on 08/10/17.

## 2017-08-12 LAB — URINE CULTURE
Culture: >=100000 — AB
Special Requests: NORMAL

## 2017-08-13 NOTE — Progress Notes (Signed)
ED CULTURE REPORT  Results for orders placed or performed during the hospital encounter of 08/10/17  Urine Culture     Status: Abnormal   Collection Time: 08/10/17  1:09 PM  Result Value Ref Range Status   Specimen Description URINE, CLEAN CATCH  Final   Special Requests Normal  Final   Culture >=100,000 COLONIES/mL ESCHERICHIA COLI (A)  Final   Report Status 08/12/2017 FINAL  Final   Organism ID, Bacteria ESCHERICHIA COLI (A)  Final      Susceptibility   Escherichia coli - MIC*    AMPICILLIN 8 SENSITIVE Sensitive     CEFAZOLIN <=4 SENSITIVE Sensitive     CEFTRIAXONE <=1 SENSITIVE Sensitive     CIPROFLOXACIN <=0.25 SENSITIVE Sensitive     GENTAMICIN <=1 SENSITIVE Sensitive     IMIPENEM <=0.25 SENSITIVE Sensitive     NITROFURANTOIN <=16 SENSITIVE Sensitive     TRIMETH/SULFA <=20 SENSITIVE Sensitive     AMPICILLIN/SULBACTAM 4 SENSITIVE Sensitive     PIP/TAZO <=4 SENSITIVE Sensitive     Extended ESBL NEGATIVE Sensitive     * >=100,000 COLONIES/mL ESCHERICHIA COLI   Culture report shows E coli sensitive to cefazolin. Patient was discharged with cephalexin. Spoke with MD Dr Archie Balboa who agreed that no further action is needed at this time.   Lendon Ka, PharmD Pharmacy Resident

## 2017-08-16 LAB — BLOOD GAS, VENOUS
Acid-Base Excess: 8 mmol/L — ABNORMAL HIGH (ref 0.0–2.0)
BICARBONATE: 33.7 mmol/L — AB (ref 20.0–28.0)
O2 Saturation: 71.9 %
PATIENT TEMPERATURE: 37
PH VEN: 7.42 (ref 7.250–7.430)
PO2 VEN: 37 mmHg (ref 32.0–45.0)
pCO2, Ven: 52 mmHg (ref 44.0–60.0)

## 2017-08-26 DIAGNOSIS — J439 Emphysema, unspecified: Secondary | ICD-10-CM | POA: Diagnosis not present

## 2017-08-26 DIAGNOSIS — G301 Alzheimer's disease with late onset: Secondary | ICD-10-CM | POA: Diagnosis not present

## 2017-08-26 DIAGNOSIS — J9611 Chronic respiratory failure with hypoxia: Secondary | ICD-10-CM | POA: Diagnosis not present

## 2017-08-26 DIAGNOSIS — F339 Major depressive disorder, recurrent, unspecified: Secondary | ICD-10-CM | POA: Diagnosis not present

## 2017-08-26 DIAGNOSIS — I481 Persistent atrial fibrillation: Secondary | ICD-10-CM | POA: Diagnosis not present

## 2017-09-18 IMAGING — CR DG CHEST 2V
2 series · 2 of 2 positions shown · non-contrast
Comparison: Chest radiograph August 16, 2016

CLINICAL DATA: Altered mental status, visual hallucinations. Recent
completion of antibiotics for possible pneumonia. History of COPD.

EXAM:
CHEST  2 VIEW

[chest lat]
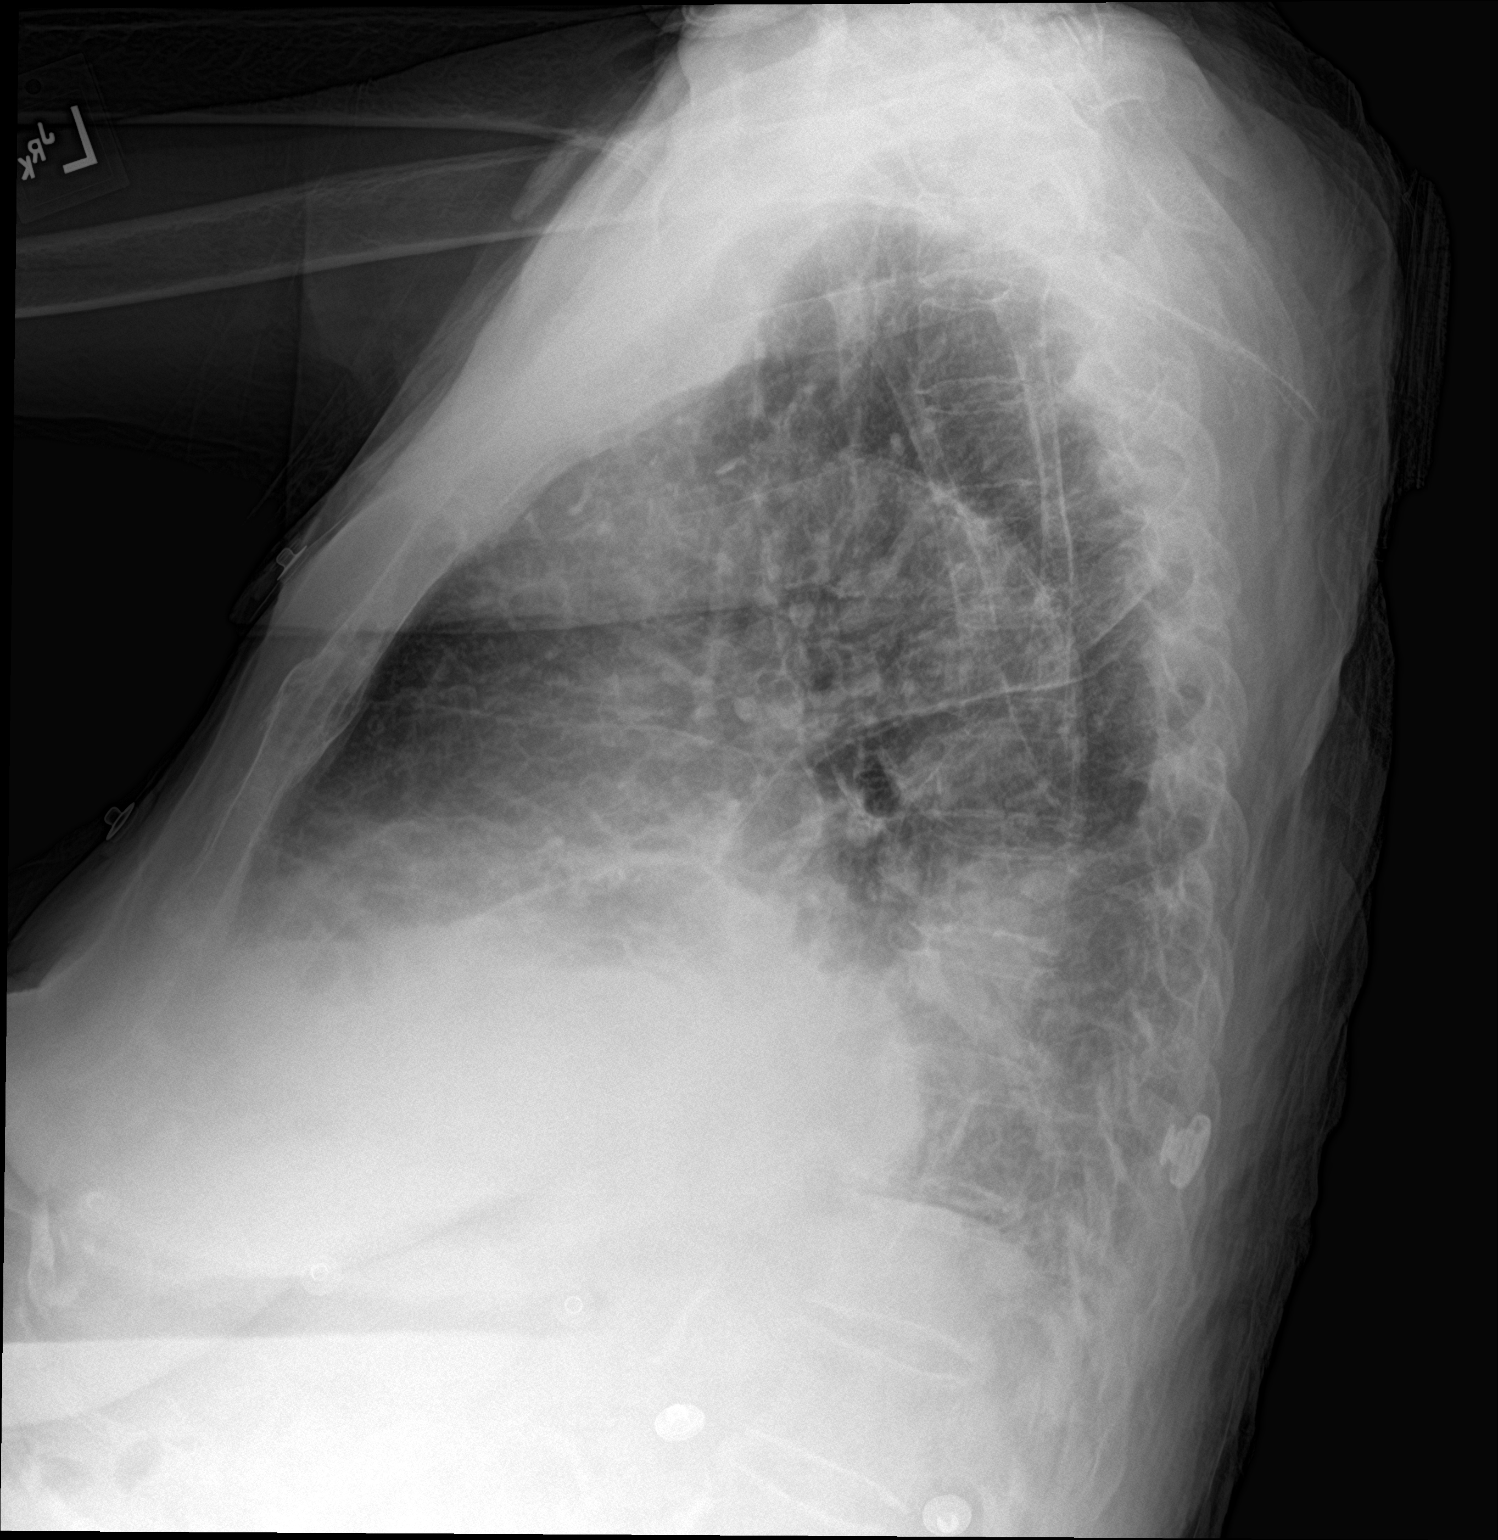

[chest ap]
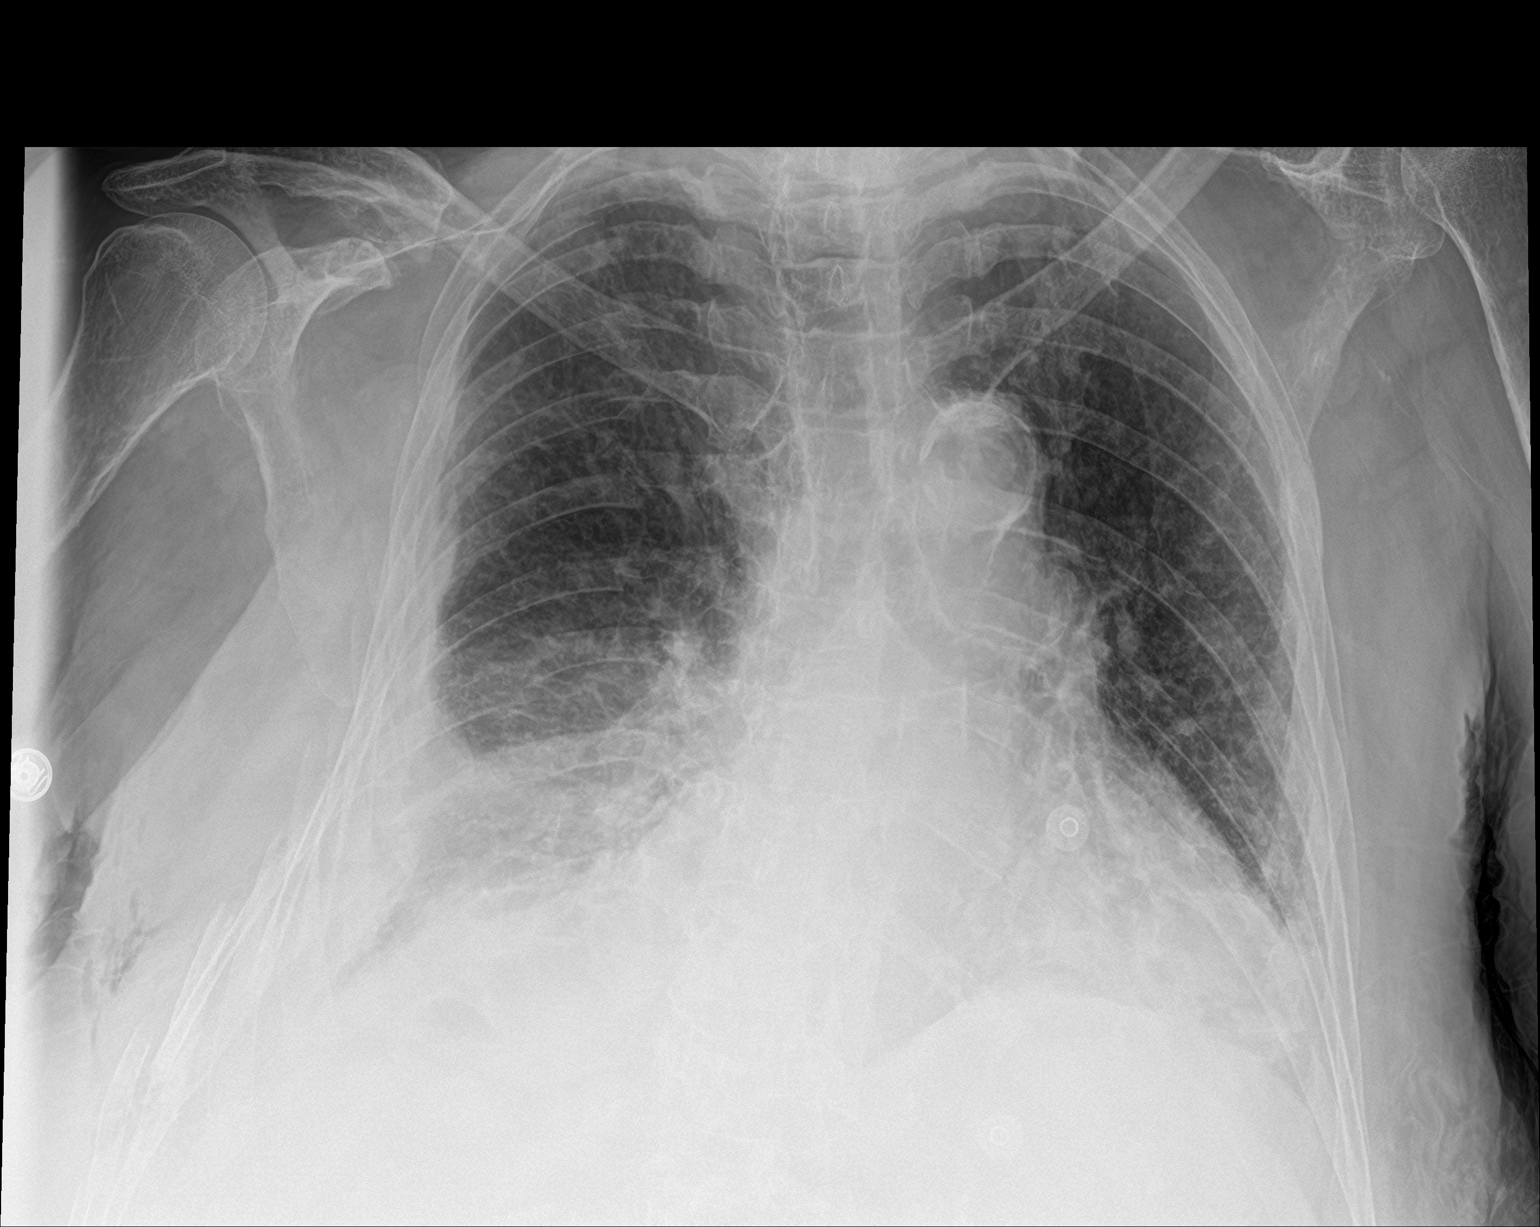

[2 of 2 positions shown; findings below may reference images not displayed]

FINDINGS: RIGHT second through ninth rib fractures, possibly segmentally
fractured with moderate hemo thorax. No pneumothorax. RIGHT lower
lobe consolidation. RIGHT chest wall subcutaneous gas.

Cardiac silhouette is moderately enlarged unchanged. Calcified
aortic knob. Diffuse interstitial prominence. Osteopenia.
IMPRESSION: Acute RIGHT second through ninth displaced rib fractures concerning
for flail chest with moderate RIGHT hemothorax, no definite
pneumothorax. RIGHT lower lobe consolidation, possible contusion.

Stable cardiomegaly and chronic interstitial changes.

Acute findings discussed with and reconfirmed by Dr.[HOSPITAL]

## 2017-09-18 IMAGING — CT CT CHEST W/O CM
2 of 3 series · 14 of 36 positions shown, 17 images · non-contrast
Comparison: Chest radiograph performed earlier today at [DATE] p.m.

CLINICAL DATA: Acute onset of worsening shortness of breath.
Initial encounter.

EXAM:
CT CHEST WITHOUT CONTRAST
TECHNIQUE: Multidetector CT imaging of the chest was performed following the
standard protocol without IV contrast.

[Series 2: thorax · axial · 0.67mm/px · z∈[-321,-79]mm · 11 of 143 slices shown, 14 images]
[im 11/143  mediastinal]
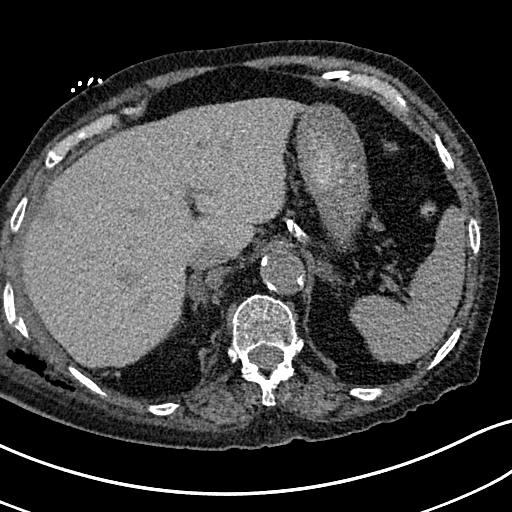
[im 11/143  lung]
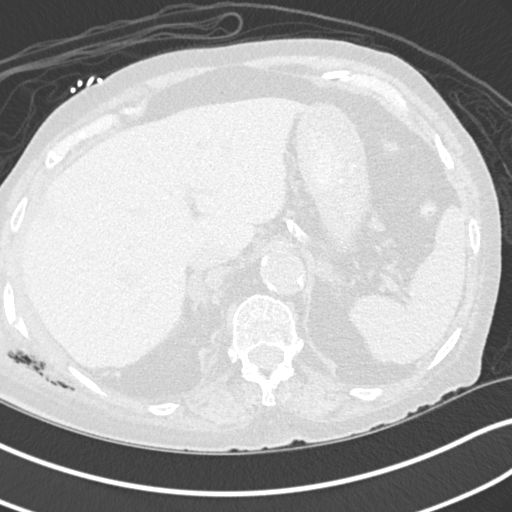
[im 22/143  lung]
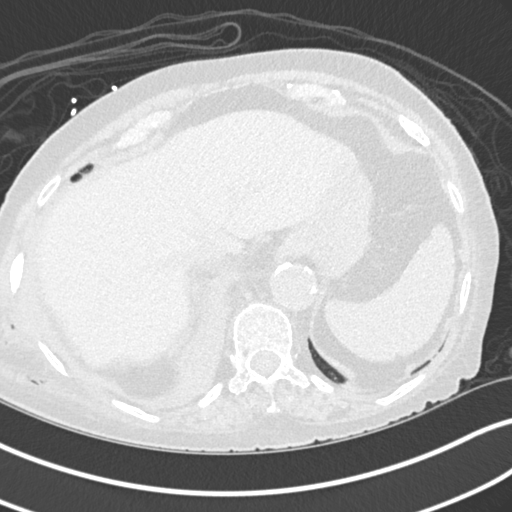
[im 32/143  lung]
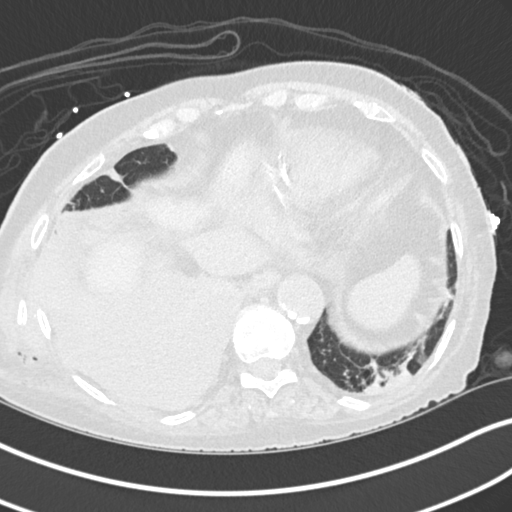
[im 48/143  lung]
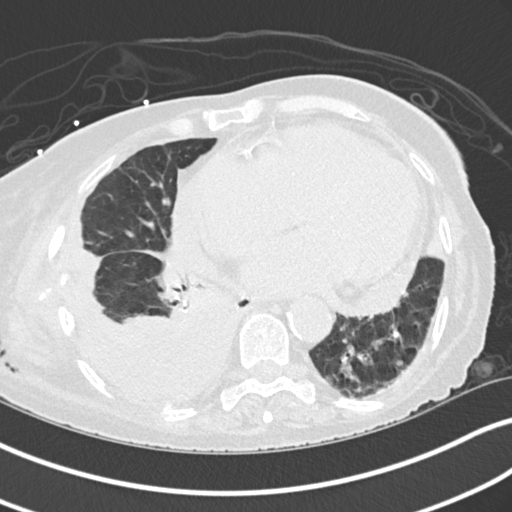
[im 58/143  mediastinal]
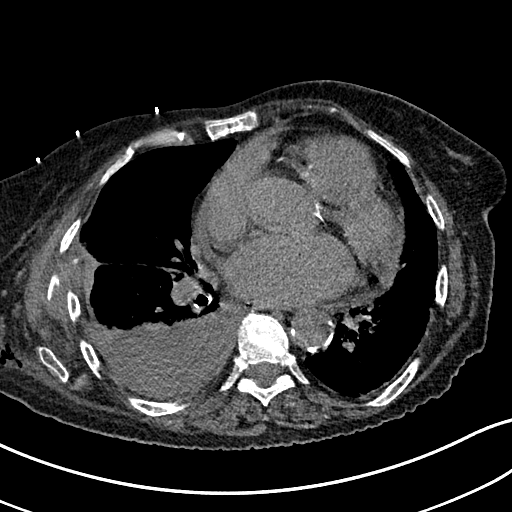
[im 58/143  lung]
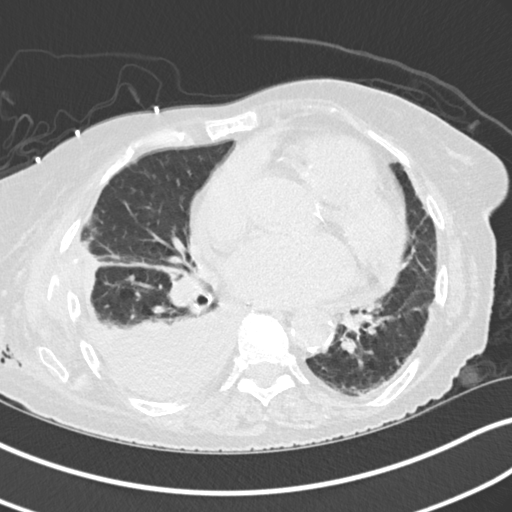
[im 74/143  lung]
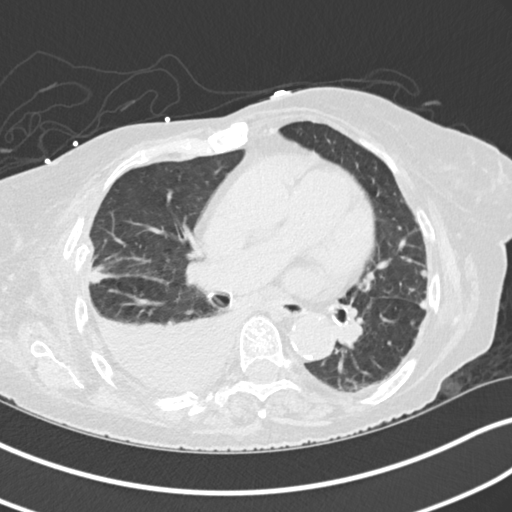
[im 85/143  lung]
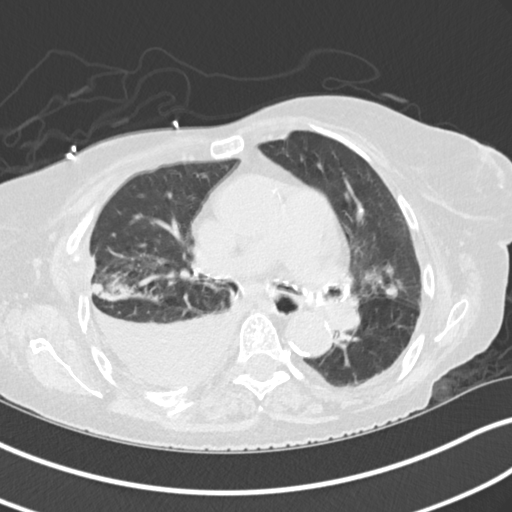
[im 95/143  lung]
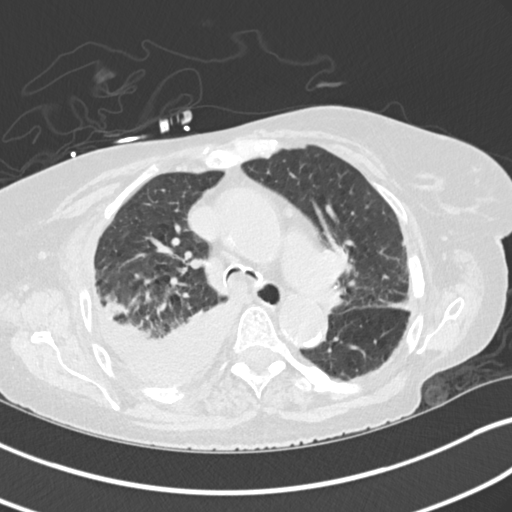
[im 111/143  mediastinal]
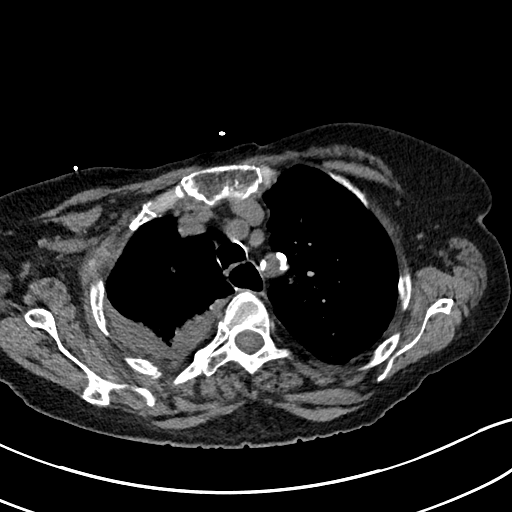
[im 111/143  lung]
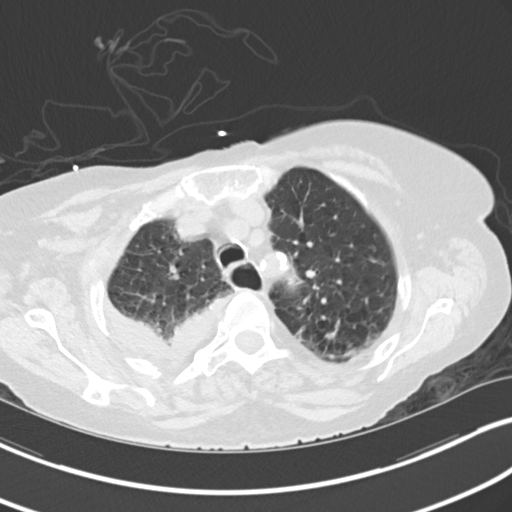
[im 121/143  lung]
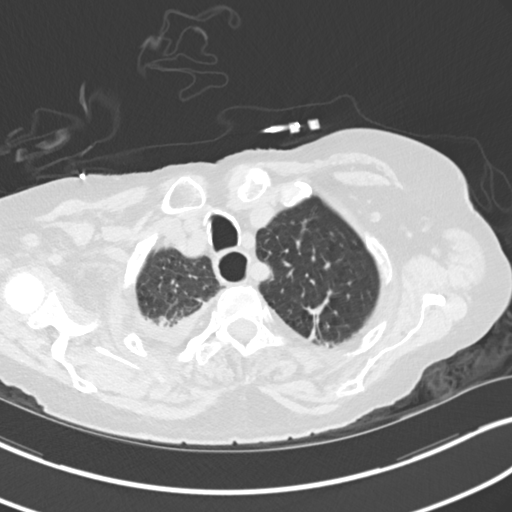
[im 132/143  lung]
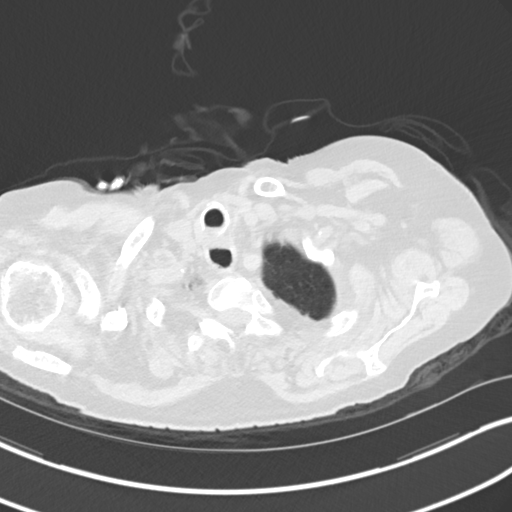

[Series 5: coronal · coronal · 0.59mm/px · 3 of 122 slices shown]
[im 25/122  lung]
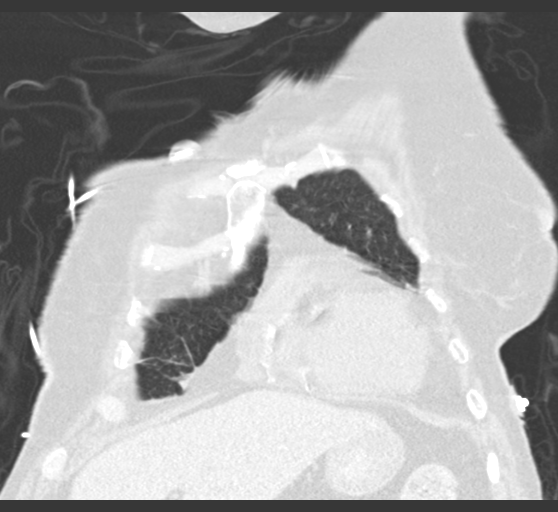
[im 49/122  lung]
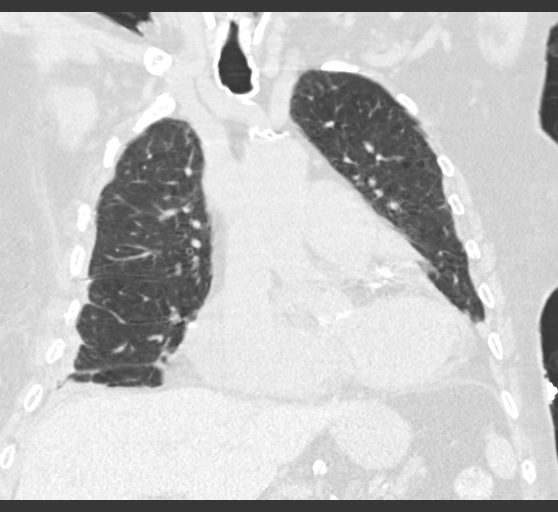
[im 73/122  lung]
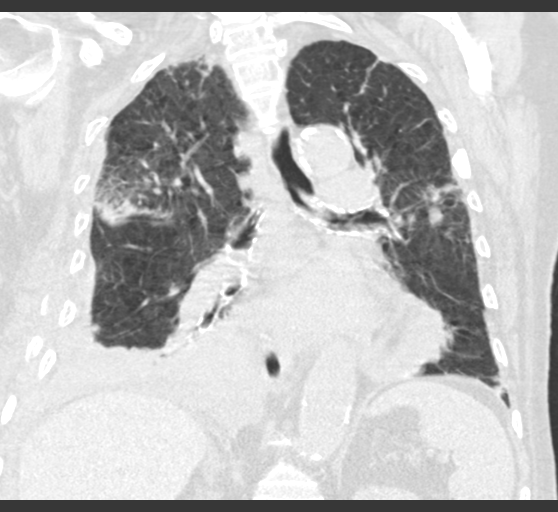

[14 of 36 positions shown; findings below may reference images not displayed]

FINDINGS: Cardiovascular: Diffuse coronary artery calcifications are seen. The
heart is mildly enlarged. Scattered calcification is noted along the
aortic arch and descending thoracic aorta. Evaluation of the
vasculature is limited without contrast. The great vessels are
grossly unremarkable in appearance.

Mediastinum/Nodes: A small pericardial effusion is noted. Visualized
mediastinal nodes remain normal in size. The thyroid gland is not
visualized. No axillary lymphadenopathy is seen.

Mild tracheobronchomalacia is noted.

Lungs/Pleura: A moderate right-sided pleural effusion is noted. This
has somewhat low attenuation, and may reflect serosanguineous fluid
rather than hemothorax. There is partial consolidation of the right
lower lobe. Underlying pneumonia cannot be entirely excluded.

Numerous peripheral nodules are noted bilaterally, measuring up to
1.1 cm in size. Metastatic disease is a concern. There is also a
spiculated 1.7 cm nodule at the posterior aspect of the left upper
lobe, which could also reflect malignancy. No pneumothorax is seen.
Scarring is noted at the left upper lobe. Mild interstitial
prominence is noted.

Upper Abdomen: The visualized portions of the liver and spleen are
unremarkable. The visualized portions of the pancreas and right
kidney are unremarkable. A small left renal cyst is noted. Scattered
calcification is noted along the proximal abdominal aorta. Scattered
nodules are noted at the adrenal glands, compatible with adrenal
adenomas given their attenuation.

Musculoskeletal: There are significantly displaced fractures of the
right lateral second through ninth ribs, and displaced fractures of
the right posterior third through ninth ribs, with surrounding soft
tissue hemorrhage and scattered mild soft tissue air tracking about
the right axilla and right posterior chest wall. Fractures of the
third through ninth ribs in two locations corresponds to concern for
flail chest. There is chronic deformity of the distal right
clavicle, sternum and left lateral fourth rib.
IMPRESSION: 1. Significantly displaced fracture of the right lateral second
through ninth ribs, and of the right posterior third through ninth
ribs, with surrounding soft tissue hemorrhage and scattered soft
tissue air tracking about the right axilla and right posterior chest
wall. Fractures of the right third through ninth ribs in 2 locations
corresponds to concern for flail chest.
2. Moderate right-sided pleural effusion. This may reflect
serosanguineous fluid or hemothorax. Partial consolidation of the
right lower lobe. Underlying pneumonia cannot be entirely excluded.
3. Diffuse coronary artery calcifications seen.  Mild cardiomegaly.
4. Small pericardial effusion noted.
5. Mild tracheobronchomalacia noted.
6. Numerous peripheral lung nodules noted bilaterally, measuring up
to 1.1 cm in size. Metastatic disease cannot be excluded. Additional
spiculated 1.7 cm nodule at the posterior aspect of the left upper
lobe, concerning for malignancy. PET/CT could be considered for
further evaluation, as deemed clinically appropriate.
7. Small left renal cyst noted.
8. Scattered bilateral adrenal adenomas noted.

These results were called by telephone at the time of interpretation
on 08/25/2016 at [DATE] to Dr. [HOSPITAL] ZEIDA, who verbally
acknowledged these results.

## 2017-10-30 DIAGNOSIS — E785 Hyperlipidemia, unspecified: Secondary | ICD-10-CM | POA: Diagnosis not present

## 2017-11-14 DIAGNOSIS — I4891 Unspecified atrial fibrillation: Secondary | ICD-10-CM | POA: Diagnosis not present

## 2017-11-14 DIAGNOSIS — J9611 Chronic respiratory failure with hypoxia: Secondary | ICD-10-CM | POA: Diagnosis not present

## 2017-11-14 DIAGNOSIS — F330 Major depressive disorder, recurrent, mild: Secondary | ICD-10-CM | POA: Diagnosis not present

## 2017-11-14 DIAGNOSIS — J449 Chronic obstructive pulmonary disease, unspecified: Secondary | ICD-10-CM | POA: Diagnosis not present

## 2017-11-14 DIAGNOSIS — G309 Alzheimer's disease, unspecified: Secondary | ICD-10-CM | POA: Diagnosis not present

## 2017-11-14 DIAGNOSIS — E039 Hypothyroidism, unspecified: Secondary | ICD-10-CM | POA: Diagnosis not present

## 2017-11-14 DIAGNOSIS — N184 Chronic kidney disease, stage 4 (severe): Secondary | ICD-10-CM | POA: Diagnosis not present

## 2018-01-06 DIAGNOSIS — J439 Emphysema, unspecified: Secondary | ICD-10-CM | POA: Diagnosis not present

## 2018-01-06 DIAGNOSIS — F330 Major depressive disorder, recurrent, mild: Secondary | ICD-10-CM | POA: Diagnosis not present

## 2018-01-06 DIAGNOSIS — G301 Alzheimer's disease with late onset: Secondary | ICD-10-CM | POA: Diagnosis not present

## 2018-01-06 DIAGNOSIS — J9611 Chronic respiratory failure with hypoxia: Secondary | ICD-10-CM | POA: Diagnosis not present

## 2018-01-06 DIAGNOSIS — I480 Paroxysmal atrial fibrillation: Secondary | ICD-10-CM | POA: Diagnosis not present

## 2018-01-06 DIAGNOSIS — N184 Chronic kidney disease, stage 4 (severe): Secondary | ICD-10-CM | POA: Diagnosis not present

## 2018-01-13 DIAGNOSIS — L309 Dermatitis, unspecified: Secondary | ICD-10-CM | POA: Diagnosis not present

## 2018-01-22 DIAGNOSIS — E785 Hyperlipidemia, unspecified: Secondary | ICD-10-CM | POA: Diagnosis not present

## 2018-01-22 DIAGNOSIS — I482 Chronic atrial fibrillation: Secondary | ICD-10-CM | POA: Diagnosis not present

## 2018-03-18 DIAGNOSIS — Z9181 History of falling: Secondary | ICD-10-CM | POA: Diagnosis not present

## 2018-03-18 DIAGNOSIS — G309 Alzheimer's disease, unspecified: Secondary | ICD-10-CM | POA: Diagnosis not present

## 2018-03-18 DIAGNOSIS — J9610 Chronic respiratory failure, unspecified whether with hypoxia or hypercapnia: Secondary | ICD-10-CM | POA: Diagnosis not present

## 2018-03-18 DIAGNOSIS — N183 Chronic kidney disease, stage 3 (moderate): Secondary | ICD-10-CM | POA: Diagnosis not present

## 2018-03-18 DIAGNOSIS — Z741 Need for assistance with personal care: Secondary | ICD-10-CM | POA: Diagnosis not present

## 2018-03-18 DIAGNOSIS — R278 Other lack of coordination: Secondary | ICD-10-CM | POA: Diagnosis not present

## 2018-03-18 DIAGNOSIS — F331 Major depressive disorder, recurrent, moderate: Secondary | ICD-10-CM | POA: Diagnosis not present

## 2018-03-18 DIAGNOSIS — E039 Hypothyroidism, unspecified: Secondary | ICD-10-CM | POA: Diagnosis not present

## 2018-03-18 DIAGNOSIS — F0280 Dementia in other diseases classified elsewhere without behavioral disturbance: Secondary | ICD-10-CM | POA: Diagnosis not present

## 2018-03-18 DIAGNOSIS — I4891 Unspecified atrial fibrillation: Secondary | ICD-10-CM | POA: Diagnosis not present

## 2018-03-20 DIAGNOSIS — Z741 Need for assistance with personal care: Secondary | ICD-10-CM | POA: Diagnosis not present

## 2018-03-20 DIAGNOSIS — Z9181 History of falling: Secondary | ICD-10-CM | POA: Diagnosis not present

## 2018-03-20 DIAGNOSIS — R278 Other lack of coordination: Secondary | ICD-10-CM | POA: Diagnosis not present

## 2018-03-20 DIAGNOSIS — F0280 Dementia in other diseases classified elsewhere without behavioral disturbance: Secondary | ICD-10-CM | POA: Diagnosis not present

## 2018-03-23 DIAGNOSIS — R278 Other lack of coordination: Secondary | ICD-10-CM | POA: Diagnosis not present

## 2018-03-23 DIAGNOSIS — Z741 Need for assistance with personal care: Secondary | ICD-10-CM | POA: Diagnosis not present

## 2018-03-25 DIAGNOSIS — Z741 Need for assistance with personal care: Secondary | ICD-10-CM | POA: Diagnosis not present

## 2018-03-25 DIAGNOSIS — R278 Other lack of coordination: Secondary | ICD-10-CM | POA: Diagnosis not present

## 2018-04-01 DIAGNOSIS — R278 Other lack of coordination: Secondary | ICD-10-CM | POA: Diagnosis not present

## 2018-04-01 DIAGNOSIS — Z741 Need for assistance with personal care: Secondary | ICD-10-CM | POA: Diagnosis not present

## 2018-04-06 DIAGNOSIS — Z741 Need for assistance with personal care: Secondary | ICD-10-CM | POA: Diagnosis not present

## 2018-04-06 DIAGNOSIS — R278 Other lack of coordination: Secondary | ICD-10-CM | POA: Diagnosis not present

## 2018-04-07 DIAGNOSIS — R278 Other lack of coordination: Secondary | ICD-10-CM | POA: Diagnosis not present

## 2018-04-07 DIAGNOSIS — Z741 Need for assistance with personal care: Secondary | ICD-10-CM | POA: Diagnosis not present

## 2018-04-09 DIAGNOSIS — Z741 Need for assistance with personal care: Secondary | ICD-10-CM | POA: Diagnosis not present

## 2018-04-09 DIAGNOSIS — R278 Other lack of coordination: Secondary | ICD-10-CM | POA: Diagnosis not present

## 2018-04-10 DIAGNOSIS — R278 Other lack of coordination: Secondary | ICD-10-CM | POA: Diagnosis not present

## 2018-04-10 DIAGNOSIS — Z741 Need for assistance with personal care: Secondary | ICD-10-CM | POA: Diagnosis not present

## 2018-04-14 DIAGNOSIS — Z741 Need for assistance with personal care: Secondary | ICD-10-CM | POA: Diagnosis not present

## 2018-04-14 DIAGNOSIS — R278 Other lack of coordination: Secondary | ICD-10-CM | POA: Diagnosis not present

## 2018-04-15 DIAGNOSIS — R278 Other lack of coordination: Secondary | ICD-10-CM | POA: Diagnosis not present

## 2018-04-15 DIAGNOSIS — Z741 Need for assistance with personal care: Secondary | ICD-10-CM | POA: Diagnosis not present

## 2018-04-17 DIAGNOSIS — R278 Other lack of coordination: Secondary | ICD-10-CM | POA: Diagnosis not present

## 2018-04-17 DIAGNOSIS — Z741 Need for assistance with personal care: Secondary | ICD-10-CM | POA: Diagnosis not present

## 2018-04-21 DIAGNOSIS — R278 Other lack of coordination: Secondary | ICD-10-CM | POA: Diagnosis not present

## 2018-04-21 DIAGNOSIS — Z741 Need for assistance with personal care: Secondary | ICD-10-CM | POA: Diagnosis not present

## 2018-04-22 DIAGNOSIS — R278 Other lack of coordination: Secondary | ICD-10-CM | POA: Diagnosis not present

## 2018-04-22 DIAGNOSIS — Z741 Need for assistance with personal care: Secondary | ICD-10-CM | POA: Diagnosis not present

## 2018-04-23 DIAGNOSIS — N184 Chronic kidney disease, stage 4 (severe): Secondary | ICD-10-CM | POA: Diagnosis not present

## 2018-04-23 DIAGNOSIS — N39498 Other specified urinary incontinence: Secondary | ICD-10-CM | POA: Diagnosis not present

## 2018-04-23 DIAGNOSIS — R278 Other lack of coordination: Secondary | ICD-10-CM | POA: Diagnosis not present

## 2018-04-23 DIAGNOSIS — M6281 Muscle weakness (generalized): Secondary | ICD-10-CM | POA: Diagnosis not present

## 2018-04-23 DIAGNOSIS — R2681 Unsteadiness on feet: Secondary | ICD-10-CM | POA: Diagnosis not present

## 2018-04-23 DIAGNOSIS — Z741 Need for assistance with personal care: Secondary | ICD-10-CM | POA: Diagnosis not present

## 2018-04-23 DIAGNOSIS — G309 Alzheimer's disease, unspecified: Secondary | ICD-10-CM | POA: Diagnosis not present

## 2018-04-23 DIAGNOSIS — Z9181 History of falling: Secondary | ICD-10-CM | POA: Diagnosis not present

## 2018-04-24 DIAGNOSIS — N184 Chronic kidney disease, stage 4 (severe): Secondary | ICD-10-CM | POA: Diagnosis not present

## 2018-04-24 DIAGNOSIS — R278 Other lack of coordination: Secondary | ICD-10-CM | POA: Diagnosis not present

## 2018-04-24 DIAGNOSIS — M6281 Muscle weakness (generalized): Secondary | ICD-10-CM | POA: Diagnosis not present

## 2018-04-24 DIAGNOSIS — Z741 Need for assistance with personal care: Secondary | ICD-10-CM | POA: Diagnosis not present

## 2018-04-24 DIAGNOSIS — G309 Alzheimer's disease, unspecified: Secondary | ICD-10-CM | POA: Diagnosis not present

## 2018-04-24 DIAGNOSIS — R2681 Unsteadiness on feet: Secondary | ICD-10-CM | POA: Diagnosis not present

## 2018-04-24 DIAGNOSIS — N39498 Other specified urinary incontinence: Secondary | ICD-10-CM | POA: Diagnosis not present

## 2018-04-24 DIAGNOSIS — Z9181 History of falling: Secondary | ICD-10-CM | POA: Diagnosis not present

## 2018-04-26 DIAGNOSIS — N39498 Other specified urinary incontinence: Secondary | ICD-10-CM | POA: Diagnosis not present

## 2018-04-26 DIAGNOSIS — M6281 Muscle weakness (generalized): Secondary | ICD-10-CM | POA: Diagnosis not present

## 2018-04-26 DIAGNOSIS — R2681 Unsteadiness on feet: Secondary | ICD-10-CM | POA: Diagnosis not present

## 2018-04-26 DIAGNOSIS — N184 Chronic kidney disease, stage 4 (severe): Secondary | ICD-10-CM | POA: Diagnosis not present

## 2018-04-26 DIAGNOSIS — Z741 Need for assistance with personal care: Secondary | ICD-10-CM | POA: Diagnosis not present

## 2018-04-26 DIAGNOSIS — R278 Other lack of coordination: Secondary | ICD-10-CM | POA: Diagnosis not present

## 2018-04-26 DIAGNOSIS — G309 Alzheimer's disease, unspecified: Secondary | ICD-10-CM | POA: Diagnosis not present

## 2018-04-26 DIAGNOSIS — Z9181 History of falling: Secondary | ICD-10-CM | POA: Diagnosis not present

## 2018-04-27 DIAGNOSIS — Z9181 History of falling: Secondary | ICD-10-CM | POA: Diagnosis not present

## 2018-04-27 DIAGNOSIS — R2681 Unsteadiness on feet: Secondary | ICD-10-CM | POA: Diagnosis not present

## 2018-04-27 DIAGNOSIS — G309 Alzheimer's disease, unspecified: Secondary | ICD-10-CM | POA: Diagnosis not present

## 2018-04-27 DIAGNOSIS — R278 Other lack of coordination: Secondary | ICD-10-CM | POA: Diagnosis not present

## 2018-04-27 DIAGNOSIS — N39498 Other specified urinary incontinence: Secondary | ICD-10-CM | POA: Diagnosis not present

## 2018-04-27 DIAGNOSIS — M6281 Muscle weakness (generalized): Secondary | ICD-10-CM | POA: Diagnosis not present

## 2018-04-27 DIAGNOSIS — N184 Chronic kidney disease, stage 4 (severe): Secondary | ICD-10-CM | POA: Diagnosis not present

## 2018-04-27 DIAGNOSIS — Z741 Need for assistance with personal care: Secondary | ICD-10-CM | POA: Diagnosis not present

## 2018-04-28 DIAGNOSIS — R2681 Unsteadiness on feet: Secondary | ICD-10-CM | POA: Diagnosis not present

## 2018-04-28 DIAGNOSIS — M6281 Muscle weakness (generalized): Secondary | ICD-10-CM | POA: Diagnosis not present

## 2018-04-28 DIAGNOSIS — G309 Alzheimer's disease, unspecified: Secondary | ICD-10-CM | POA: Diagnosis not present

## 2018-04-28 DIAGNOSIS — N184 Chronic kidney disease, stage 4 (severe): Secondary | ICD-10-CM | POA: Diagnosis not present

## 2018-04-28 DIAGNOSIS — Z741 Need for assistance with personal care: Secondary | ICD-10-CM | POA: Diagnosis not present

## 2018-04-28 DIAGNOSIS — R278 Other lack of coordination: Secondary | ICD-10-CM | POA: Diagnosis not present

## 2018-04-28 DIAGNOSIS — Z9181 History of falling: Secondary | ICD-10-CM | POA: Diagnosis not present

## 2018-04-28 DIAGNOSIS — N39498 Other specified urinary incontinence: Secondary | ICD-10-CM | POA: Diagnosis not present

## 2018-04-29 DIAGNOSIS — N184 Chronic kidney disease, stage 4 (severe): Secondary | ICD-10-CM | POA: Diagnosis not present

## 2018-04-29 DIAGNOSIS — G309 Alzheimer's disease, unspecified: Secondary | ICD-10-CM | POA: Diagnosis not present

## 2018-04-29 DIAGNOSIS — M6281 Muscle weakness (generalized): Secondary | ICD-10-CM | POA: Diagnosis not present

## 2018-04-29 DIAGNOSIS — N39498 Other specified urinary incontinence: Secondary | ICD-10-CM | POA: Diagnosis not present

## 2018-04-29 DIAGNOSIS — Z9181 History of falling: Secondary | ICD-10-CM | POA: Diagnosis not present

## 2018-04-29 DIAGNOSIS — R2681 Unsteadiness on feet: Secondary | ICD-10-CM | POA: Diagnosis not present

## 2018-04-29 DIAGNOSIS — Z741 Need for assistance with personal care: Secondary | ICD-10-CM | POA: Diagnosis not present

## 2018-04-29 DIAGNOSIS — R278 Other lack of coordination: Secondary | ICD-10-CM | POA: Diagnosis not present

## 2018-04-30 DIAGNOSIS — Z741 Need for assistance with personal care: Secondary | ICD-10-CM | POA: Diagnosis not present

## 2018-04-30 DIAGNOSIS — G309 Alzheimer's disease, unspecified: Secondary | ICD-10-CM | POA: Diagnosis not present

## 2018-04-30 DIAGNOSIS — Z9181 History of falling: Secondary | ICD-10-CM | POA: Diagnosis not present

## 2018-04-30 DIAGNOSIS — N184 Chronic kidney disease, stage 4 (severe): Secondary | ICD-10-CM | POA: Diagnosis not present

## 2018-04-30 DIAGNOSIS — I872 Venous insufficiency (chronic) (peripheral): Secondary | ICD-10-CM | POA: Diagnosis not present

## 2018-04-30 DIAGNOSIS — R278 Other lack of coordination: Secondary | ICD-10-CM | POA: Diagnosis not present

## 2018-04-30 DIAGNOSIS — N39498 Other specified urinary incontinence: Secondary | ICD-10-CM | POA: Diagnosis not present

## 2018-04-30 DIAGNOSIS — M6281 Muscle weakness (generalized): Secondary | ICD-10-CM | POA: Diagnosis not present

## 2018-04-30 DIAGNOSIS — R2681 Unsteadiness on feet: Secondary | ICD-10-CM | POA: Diagnosis not present

## 2018-05-01 DIAGNOSIS — M6281 Muscle weakness (generalized): Secondary | ICD-10-CM | POA: Diagnosis not present

## 2018-05-01 DIAGNOSIS — R2681 Unsteadiness on feet: Secondary | ICD-10-CM | POA: Diagnosis not present

## 2018-05-01 DIAGNOSIS — N184 Chronic kidney disease, stage 4 (severe): Secondary | ICD-10-CM | POA: Diagnosis not present

## 2018-05-01 DIAGNOSIS — G309 Alzheimer's disease, unspecified: Secondary | ICD-10-CM | POA: Diagnosis not present

## 2018-05-01 DIAGNOSIS — N39498 Other specified urinary incontinence: Secondary | ICD-10-CM | POA: Diagnosis not present

## 2018-05-01 DIAGNOSIS — Z9181 History of falling: Secondary | ICD-10-CM | POA: Diagnosis not present

## 2018-05-01 DIAGNOSIS — R278 Other lack of coordination: Secondary | ICD-10-CM | POA: Diagnosis not present

## 2018-05-01 DIAGNOSIS — Z741 Need for assistance with personal care: Secondary | ICD-10-CM | POA: Diagnosis not present

## 2018-05-04 DIAGNOSIS — Z9181 History of falling: Secondary | ICD-10-CM | POA: Diagnosis not present

## 2018-05-04 DIAGNOSIS — G309 Alzheimer's disease, unspecified: Secondary | ICD-10-CM | POA: Diagnosis not present

## 2018-05-04 DIAGNOSIS — R278 Other lack of coordination: Secondary | ICD-10-CM | POA: Diagnosis not present

## 2018-05-04 DIAGNOSIS — N39498 Other specified urinary incontinence: Secondary | ICD-10-CM | POA: Diagnosis not present

## 2018-05-04 DIAGNOSIS — Z741 Need for assistance with personal care: Secondary | ICD-10-CM | POA: Diagnosis not present

## 2018-05-04 DIAGNOSIS — N184 Chronic kidney disease, stage 4 (severe): Secondary | ICD-10-CM | POA: Diagnosis not present

## 2018-05-04 DIAGNOSIS — R2681 Unsteadiness on feet: Secondary | ICD-10-CM | POA: Diagnosis not present

## 2018-05-04 DIAGNOSIS — M6281 Muscle weakness (generalized): Secondary | ICD-10-CM | POA: Diagnosis not present

## 2018-05-05 DIAGNOSIS — N184 Chronic kidney disease, stage 4 (severe): Secondary | ICD-10-CM | POA: Diagnosis not present

## 2018-05-05 DIAGNOSIS — M6281 Muscle weakness (generalized): Secondary | ICD-10-CM | POA: Diagnosis not present

## 2018-05-05 DIAGNOSIS — N39498 Other specified urinary incontinence: Secondary | ICD-10-CM | POA: Diagnosis not present

## 2018-05-05 DIAGNOSIS — G309 Alzheimer's disease, unspecified: Secondary | ICD-10-CM | POA: Diagnosis not present

## 2018-05-05 DIAGNOSIS — Z9181 History of falling: Secondary | ICD-10-CM | POA: Diagnosis not present

## 2018-05-05 DIAGNOSIS — Z741 Need for assistance with personal care: Secondary | ICD-10-CM | POA: Diagnosis not present

## 2018-05-05 DIAGNOSIS — R278 Other lack of coordination: Secondary | ICD-10-CM | POA: Diagnosis not present

## 2018-05-05 DIAGNOSIS — R2681 Unsteadiness on feet: Secondary | ICD-10-CM | POA: Diagnosis not present

## 2018-05-06 DIAGNOSIS — M6281 Muscle weakness (generalized): Secondary | ICD-10-CM | POA: Diagnosis not present

## 2018-05-06 DIAGNOSIS — Z9181 History of falling: Secondary | ICD-10-CM | POA: Diagnosis not present

## 2018-05-06 DIAGNOSIS — N184 Chronic kidney disease, stage 4 (severe): Secondary | ICD-10-CM | POA: Diagnosis not present

## 2018-05-06 DIAGNOSIS — G309 Alzheimer's disease, unspecified: Secondary | ICD-10-CM | POA: Diagnosis not present

## 2018-05-06 DIAGNOSIS — R278 Other lack of coordination: Secondary | ICD-10-CM | POA: Diagnosis not present

## 2018-05-06 DIAGNOSIS — R2681 Unsteadiness on feet: Secondary | ICD-10-CM | POA: Diagnosis not present

## 2018-05-06 DIAGNOSIS — N39498 Other specified urinary incontinence: Secondary | ICD-10-CM | POA: Diagnosis not present

## 2018-05-06 DIAGNOSIS — Z741 Need for assistance with personal care: Secondary | ICD-10-CM | POA: Diagnosis not present

## 2018-07-02 DIAGNOSIS — L03116 Cellulitis of left lower limb: Secondary | ICD-10-CM | POA: Diagnosis not present

## 2018-07-08 DIAGNOSIS — N950 Postmenopausal bleeding: Secondary | ICD-10-CM | POA: Diagnosis not present

## 2018-07-13 DIAGNOSIS — N390 Urinary tract infection, site not specified: Secondary | ICD-10-CM | POA: Diagnosis not present

## 2018-07-14 DIAGNOSIS — R319 Hematuria, unspecified: Secondary | ICD-10-CM | POA: Diagnosis not present

## 2018-07-17 DIAGNOSIS — G309 Alzheimer's disease, unspecified: Secondary | ICD-10-CM

## 2018-07-17 DIAGNOSIS — E039 Hypothyroidism, unspecified: Secondary | ICD-10-CM

## 2018-07-17 DIAGNOSIS — F331 Major depressive disorder, recurrent, moderate: Secondary | ICD-10-CM

## 2018-07-17 DIAGNOSIS — J449 Chronic obstructive pulmonary disease, unspecified: Secondary | ICD-10-CM

## 2018-07-17 DIAGNOSIS — I4891 Unspecified atrial fibrillation: Secondary | ICD-10-CM

## 2018-07-17 DIAGNOSIS — N184 Chronic kidney disease, stage 4 (severe): Secondary | ICD-10-CM

## 2018-08-10 DIAGNOSIS — I10 Essential (primary) hypertension: Secondary | ICD-10-CM | POA: Diagnosis not present

## 2018-08-11 ENCOUNTER — Other Ambulatory Visit: Payer: Self-pay | Admitting: Gastroenterology

## 2018-08-11 DIAGNOSIS — D509 Iron deficiency anemia, unspecified: Secondary | ICD-10-CM

## 2018-08-11 DIAGNOSIS — K625 Hemorrhage of anus and rectum: Secondary | ICD-10-CM

## 2018-08-14 ENCOUNTER — Ambulatory Visit
Admission: RE | Admit: 2018-08-14 | Discharge: 2018-08-14 | Disposition: A | Discharge status: Home / Self Care | Payer: Medicare HMO | Source: Ambulatory Visit | Attending: Gastroenterology | Admitting: Gastroenterology

## 2018-08-14 DIAGNOSIS — Z79899 Other long term (current) drug therapy: Secondary | ICD-10-CM | POA: Diagnosis not present

## 2018-08-14 DIAGNOSIS — Z7982 Long term (current) use of aspirin: Secondary | ICD-10-CM | POA: Diagnosis not present

## 2018-08-14 DIAGNOSIS — R4182 Altered mental status, unspecified: Secondary | ICD-10-CM | POA: Diagnosis not present

## 2018-08-14 DIAGNOSIS — K625 Hemorrhage of anus and rectum: Secondary | ICD-10-CM | POA: Insufficient documentation

## 2018-08-14 DIAGNOSIS — R918 Other nonspecific abnormal finding of lung field: Secondary | ICD-10-CM | POA: Diagnosis not present

## 2018-08-14 DIAGNOSIS — N183 Chronic kidney disease, stage 3 (moderate): Secondary | ICD-10-CM | POA: Diagnosis present

## 2018-08-14 DIAGNOSIS — Z66 Do not resuscitate: Secondary | ICD-10-CM | POA: Diagnosis present

## 2018-08-14 DIAGNOSIS — R935 Abnormal findings on diagnostic imaging of other abdominal regions, including retroperitoneum: Secondary | ICD-10-CM | POA: Diagnosis not present

## 2018-08-14 DIAGNOSIS — D62 Acute posthemorrhagic anemia: Secondary | ICD-10-CM | POA: Diagnosis present

## 2018-08-14 DIAGNOSIS — E890 Postprocedural hypothyroidism: Secondary | ICD-10-CM | POA: Diagnosis present

## 2018-08-14 DIAGNOSIS — Z9981 Dependence on supplemental oxygen: Secondary | ICD-10-CM | POA: Diagnosis not present

## 2018-08-14 DIAGNOSIS — I482 Chronic atrial fibrillation, unspecified: Secondary | ICD-10-CM | POA: Diagnosis present

## 2018-08-14 DIAGNOSIS — Z87891 Personal history of nicotine dependence: Secondary | ICD-10-CM | POA: Diagnosis not present

## 2018-08-14 DIAGNOSIS — J9610 Chronic respiratory failure, unspecified whether with hypoxia or hypercapnia: Secondary | ICD-10-CM | POA: Diagnosis present

## 2018-08-14 DIAGNOSIS — G4730 Sleep apnea, unspecified: Secondary | ICD-10-CM | POA: Diagnosis present

## 2018-08-14 DIAGNOSIS — K922 Gastrointestinal hemorrhage, unspecified: Secondary | ICD-10-CM | POA: Diagnosis present

## 2018-08-14 DIAGNOSIS — Z7401 Bed confinement status: Secondary | ICD-10-CM | POA: Diagnosis not present

## 2018-08-14 DIAGNOSIS — N939 Abnormal uterine and vaginal bleeding, unspecified: Secondary | ICD-10-CM | POA: Diagnosis not present

## 2018-08-14 DIAGNOSIS — F0280 Dementia in other diseases classified elsewhere without behavioral disturbance: Secondary | ICD-10-CM | POA: Diagnosis present

## 2018-08-14 DIAGNOSIS — N200 Calculus of kidney: Secondary | ICD-10-CM | POA: Diagnosis not present

## 2018-08-14 DIAGNOSIS — Z85850 Personal history of malignant neoplasm of thyroid: Secondary | ICD-10-CM | POA: Diagnosis not present

## 2018-08-14 DIAGNOSIS — N3289 Other specified disorders of bladder: Secondary | ICD-10-CM | POA: Diagnosis present

## 2018-08-14 DIAGNOSIS — D509 Iron deficiency anemia, unspecified: Secondary | ICD-10-CM

## 2018-08-14 DIAGNOSIS — J449 Chronic obstructive pulmonary disease, unspecified: Secondary | ICD-10-CM | POA: Diagnosis present

## 2018-08-14 DIAGNOSIS — N2889 Other specified disorders of kidney and ureter: Secondary | ICD-10-CM | POA: Diagnosis present

## 2018-08-14 DIAGNOSIS — H3530 Unspecified macular degeneration: Secondary | ICD-10-CM | POA: Diagnosis present

## 2018-08-14 DIAGNOSIS — R319 Hematuria, unspecified: Secondary | ICD-10-CM | POA: Diagnosis present

## 2018-08-14 DIAGNOSIS — I509 Heart failure, unspecified: Secondary | ICD-10-CM | POA: Diagnosis present

## 2018-08-14 DIAGNOSIS — G309 Alzheimer's disease, unspecified: Secondary | ICD-10-CM | POA: Diagnosis present

## 2018-08-17 ENCOUNTER — Other Ambulatory Visit: Payer: Self-pay

## 2018-08-17 ENCOUNTER — Inpatient Hospital Stay
Admission: EM | Admit: 2018-08-17 | Discharge: 2018-08-18 | DRG: 378 | Disposition: A | Discharge status: Hospice | Payer: Medicare HMO | Attending: Internal Medicine | Admitting: Internal Medicine

## 2018-08-17 DIAGNOSIS — G4730 Sleep apnea, unspecified: Secondary | ICD-10-CM | POA: Diagnosis present

## 2018-08-17 DIAGNOSIS — J9610 Chronic respiratory failure, unspecified whether with hypoxia or hypercapnia: Secondary | ICD-10-CM | POA: Diagnosis present

## 2018-08-17 DIAGNOSIS — I509 Heart failure, unspecified: Secondary | ICD-10-CM | POA: Diagnosis present

## 2018-08-17 DIAGNOSIS — Z66 Do not resuscitate: Secondary | ICD-10-CM | POA: Diagnosis present

## 2018-08-17 DIAGNOSIS — Z87891 Personal history of nicotine dependence: Secondary | ICD-10-CM

## 2018-08-17 DIAGNOSIS — F0280 Dementia in other diseases classified elsewhere without behavioral disturbance: Secondary | ICD-10-CM | POA: Diagnosis present

## 2018-08-17 DIAGNOSIS — Z7982 Long term (current) use of aspirin: Secondary | ICD-10-CM | POA: Diagnosis not present

## 2018-08-17 DIAGNOSIS — Z79899 Other long term (current) drug therapy: Secondary | ICD-10-CM

## 2018-08-17 DIAGNOSIS — N2889 Other specified disorders of kidney and ureter: Secondary | ICD-10-CM | POA: Diagnosis present

## 2018-08-17 DIAGNOSIS — Z85850 Personal history of malignant neoplasm of thyroid: Secondary | ICD-10-CM | POA: Diagnosis not present

## 2018-08-17 DIAGNOSIS — N183 Chronic kidney disease, stage 3 (moderate): Secondary | ICD-10-CM | POA: Diagnosis present

## 2018-08-17 DIAGNOSIS — I482 Chronic atrial fibrillation, unspecified: Secondary | ICD-10-CM | POA: Diagnosis present

## 2018-08-17 DIAGNOSIS — D62 Acute posthemorrhagic anemia: Secondary | ICD-10-CM | POA: Diagnosis present

## 2018-08-17 DIAGNOSIS — G309 Alzheimer's disease, unspecified: Secondary | ICD-10-CM | POA: Diagnosis present

## 2018-08-17 DIAGNOSIS — J449 Chronic obstructive pulmonary disease, unspecified: Secondary | ICD-10-CM | POA: Diagnosis present

## 2018-08-17 DIAGNOSIS — Z9981 Dependence on supplemental oxygen: Secondary | ICD-10-CM | POA: Diagnosis not present

## 2018-08-17 DIAGNOSIS — R319 Hematuria, unspecified: Secondary | ICD-10-CM | POA: Diagnosis present

## 2018-08-17 DIAGNOSIS — K625 Hemorrhage of anus and rectum: Secondary | ICD-10-CM | POA: Diagnosis not present

## 2018-08-17 DIAGNOSIS — R310 Gross hematuria: Secondary | ICD-10-CM

## 2018-08-17 DIAGNOSIS — E890 Postprocedural hypothyroidism: Secondary | ICD-10-CM | POA: Diagnosis present

## 2018-08-17 DIAGNOSIS — H3530 Unspecified macular degeneration: Secondary | ICD-10-CM | POA: Diagnosis present

## 2018-08-17 DIAGNOSIS — N3289 Other specified disorders of bladder: Secondary | ICD-10-CM | POA: Diagnosis present

## 2018-08-17 DIAGNOSIS — R935 Abnormal findings on diagnostic imaging of other abdominal regions, including retroperitoneum: Secondary | ICD-10-CM | POA: Diagnosis not present

## 2018-08-17 DIAGNOSIS — K922 Gastrointestinal hemorrhage, unspecified: Secondary | ICD-10-CM | POA: Diagnosis present

## 2018-08-17 LAB — COMPREHENSIVE METABOLIC PANEL
ALK PHOS: 70 U/L (ref 38–126)
ALT: 11 U/L (ref 0–44)
AST: 19 U/L (ref 15–41)
Albumin: 3.1 g/dL — ABNORMAL LOW (ref 3.5–5.0)
Anion gap: 8 (ref 5–15)
BILIRUBIN TOTAL: 0.4 mg/dL (ref 0.3–1.2)
BUN: 31 mg/dL — AB (ref 8–23)
CALCIUM: 9.3 mg/dL (ref 8.9–10.3)
CHLORIDE: 94 mmol/L — AB (ref 98–111)
CO2: 34 mmol/L — ABNORMAL HIGH (ref 22–32)
CREATININE: 1.88 mg/dL — AB (ref 0.44–1.00)
GFR calc Af Amer: 27 mL/min — ABNORMAL LOW (ref >60)
GFR, EST NON AFRICAN AMERICAN: 23 mL/min — AB (ref >60)
Glucose, Bld: 90 mg/dL (ref 70–99)
Potassium: 4.8 mmol/L (ref 3.5–5.1)
Sodium: 136 mmol/L (ref 135–145)
Total Protein: 6.9 g/dL (ref 6.5–8.1)

## 2018-08-17 LAB — CBC
HEMATOCRIT: 25.7 % — AB (ref 36.0–46.0)
HEMOGLOBIN: 7.7 g/dL — AB (ref 12.0–15.0)
MCH: 25.4 pg — ABNORMAL LOW (ref 26.0–34.0)
MCHC: 30 g/dL (ref 30.0–36.0)
MCV: 84.8 fL (ref 80.0–100.0)
PLATELETS: 267 10*3/uL (ref 150–400)
RBC: 3.03 MIL/uL — AB (ref 3.87–5.11)
RDW: 14.8 % (ref 11.5–15.5)
WBC: 6.7 10*3/uL (ref 4.0–10.5)
nRBC: 0 % (ref 0.0–0.2)

## 2018-08-17 LAB — APTT: APTT: 31 s (ref 24–36)

## 2018-08-17 LAB — PROTIME-INR
INR: 1.12
PROTHROMBIN TIME: 14.3 s (ref 11.4–15.2)

## 2018-08-17 LAB — FERRITIN: Ferritin: 443 ng/mL — ABNORMAL HIGH (ref 11–307)

## 2018-08-17 LAB — PREPARE RBC (CROSSMATCH)

## 2018-08-17 MED ORDER — ACETAMINOPHEN 500 MG PO TABS: 1000.0000 mg | ORAL_TABLET | Freq: Three times a day (TID) | ORAL | Status: DC | PRN

## 2018-08-17 MED ORDER — CALCITRIOL 0.25 MCG PO CAPS
0.2500 ug | ORAL_CAPSULE | Freq: Two times a day (BID) | ORAL | Status: DC
  Administered 2018-08-17 – 2018-08-18 (×2): 0.25 ug via ORAL
  Filled 2018-08-17 (×3): qty 1

## 2018-08-17 MED ORDER — ESCITALOPRAM OXALATE 10 MG PO TABS
20.0000 mg | ORAL_TABLET | Freq: Every day | ORAL | Status: DC
  Administered 2018-08-18: 20 mg via ORAL
  Filled 2018-08-17: qty 2

## 2018-08-17 MED ORDER — CALCIUM CARBONATE ANTACID 500 MG PO CHEW
2.0000 | CHEWABLE_TABLET | Freq: Three times a day (TID) | ORAL | Status: DC
  Administered 2018-08-17 – 2018-08-18 (×2): 400 mg via ORAL
  Filled 2018-08-17 (×2): qty 2

## 2018-08-17 MED ORDER — FERROUS SULFATE 325 (65 FE) MG PO TABS
325.0000 mg | ORAL_TABLET | Freq: Every day | ORAL | Status: DC
  Administered 2018-08-17: 325 mg via ORAL
  Filled 2018-08-17: qty 1

## 2018-08-17 MED ORDER — POLYETHYLENE GLYCOL 3350 17 G PO PACK: 17.0000 | PACK | Freq: Every day | ORAL | Status: DC | PRN

## 2018-08-17 MED ORDER — ACETAMINOPHEN 325 MG PO TABS
650.0000 mg | ORAL_TABLET | Freq: Once | ORAL | Status: AC
  Administered 2018-08-17: 650 mg via ORAL
  Filled 2018-08-17: qty 2

## 2018-08-17 MED ORDER — ONDANSETRON HCL 4 MG PO TABS: 4.0000 mg | ORAL_TABLET | Freq: Four times a day (QID) | ORAL | Status: DC | PRN

## 2018-08-17 MED ORDER — ACETAMINOPHEN 650 MG RE SUPP: 650.0000 mg | Freq: Four times a day (QID) | RECTAL | Status: DC | PRN

## 2018-08-17 MED ORDER — ONDANSETRON HCL 4 MG/2ML IJ SOLN: 4.0000 mg | Freq: Four times a day (QID) | INTRAMUSCULAR | Status: DC | PRN

## 2018-08-17 MED ORDER — FUROSEMIDE 10 MG/ML IJ SOLN
20.0000 mg | Freq: Once | INTRAMUSCULAR | Status: AC
  Administered 2018-08-17: 23:00:00 20 mg via INTRAVENOUS
  Filled 2018-08-17: qty 2

## 2018-08-17 MED ORDER — ACETAMINOPHEN 325 MG PO TABS: 650.0000 mg | ORAL_TABLET | Freq: Four times a day (QID) | ORAL | Status: DC | PRN

## 2018-08-17 MED ORDER — METOPROLOL SUCCINATE ER 50 MG PO TB24
25.0000 mg | ORAL_TABLET | Freq: Every day | ORAL | Status: DC
  Administered 2018-08-18: 11:00:00 25 mg via ORAL
  Filled 2018-08-17: qty 1

## 2018-08-17 MED ORDER — LEVOTHYROXINE SODIUM 137 MCG PO TABS
137.0000 ug | ORAL_TABLET | Freq: Every day | ORAL | Status: DC
  Administered 2018-08-17: 23:00:00 137 ug via ORAL
  Filled 2018-08-17 (×2): qty 1

## 2018-08-17 MED ORDER — FLUTICASONE FUROATE-VILANTEROL 100-25 MCG/INH IN AEPB
1.0000 | INHALATION_SPRAY | Freq: Every day | RESPIRATORY_TRACT | Status: DC
  Administered 2018-08-18: 1 via RESPIRATORY_TRACT
  Filled 2018-08-17: qty 28

## 2018-08-17 MED ORDER — SODIUM CHLORIDE 0.9% IV SOLUTION
Freq: Once | INTRAVENOUS | Status: AC
  Administered 2018-08-17: 23:00:00 via INTRAVENOUS

## 2018-08-17 MED ORDER — TIOTROPIUM BROMIDE MONOHYDRATE 18 MCG IN CAPS
18.0000 ug | ORAL_CAPSULE | Freq: Every day | RESPIRATORY_TRACT | Status: DC
  Administered 2018-08-18: 11:00:00 18 ug via RESPIRATORY_TRACT
  Filled 2018-08-17: qty 5

## 2018-08-17 MED ORDER — NYSTATIN 100000 UNIT/GM EX POWD
1.0000 g | Freq: Two times a day (BID) | CUTANEOUS | Status: DC | PRN
  Filled 2018-08-17: qty 15

## 2018-08-17 NOTE — H&P (Signed)
Vanessa Hancock NAME: Vanessa Hancock    MR#:  [id number]  DATE OF BIRTH:  1932-04-04  DATE OF ADMISSION:  08/17/2018  PRIMARY CARE PHYSICIAN: [name], MD   REQUESTING/REFERRING PHYSICIAN: Dr Lavonia Drafts  CHIEF COMPLAINT:   Chief Complaint  Patient presents with  . Vaginal Bleeding    HISTORY OF PRESENT ILLNESS:  Vanessa Hancock  is a 84 y.o. female sent in for rectal bleeding but the patient states that it is bleeding in her urine but does not happen every time she urinates.  She was seen by outpatient gastroenterology and sent for a CT scan which showed a right kidney mass and high density material in the right ureter consistent with blood or hematuria.  Patient also had pulmonary nodules vision related at the lung bases.  Patient had a vaginal exam which was done and showed no blood in it by the ER physician.  Rectal exam was guaiac negative.  Hospitalist services were contacted for admission for anemia with hemoglobin of 7.7.  Son stated last week hemoglobin was 9.9.  PAST MEDICAL HISTORY:   Past Medical History:  Diagnosis Date  . A-fib ([place])   . Alzheimer disease ([place])    early stages (2016)  . Cancer ([place])    Thyroid  . CHF (congestive heart failure) ([place])   . COPD (chronic obstructive pulmonary disease) ([place])   . Macular degeneration   . Recurrent thyroid cancer ([place]) 03/28/2015  . Sleep apnea     PAST SURGICAL HISTORY:   Past Surgical History:  Procedure Laterality Date  . APPENDECTOMY    . THYROIDECTOMY    . TONSILLECTOMY    . vericose vein      SOCIAL HISTORY:   Social History   Tobacco Use  . Smoking status: Former Research scientist (life sciences)  . Smokeless tobacco: Never Used  Substance Use Topics  . Alcohol use: No    Alcohol/week: 0.0 standard drinks    FAMILY HISTORY:   Family History  Problem Relation Age of Onset  . Diabetes Mother     DRUG ALLERGIES:  No Known Allergies  REVIEW OF SYSTEMS:   CONSTITUTIONAL: No fever, fatigue or weakness.  EYES: No blurred or double vision.  EARS, NOSE, AND THROAT: No tinnitus or ear pain.  Positive for sore throat RESPIRATORY: No cough, shortness of breath, wheezing or hemoptysis.  CARDIOVASCULAR: No chest pain, orthopnea, edema.  GASTROINTESTINAL: No nausea, vomiting, diarrhea or abdominal pain. No blood in bowel movements GENITOURINARY: No dysuria.  Positive for hematuria.  ENDOCRINE: No polyuria, nocturia,  HEMATOLOGY: No anemia, easy bruising or bleeding SKIN: No rash or lesion. MUSCULOSKELETAL: No joint pain or arthritis.   NEUROLOGIC: No tingling, numbness, weakness.  PSYCHIATRY: No anxiety or depression.   MEDICATIONS AT HOME:   Prior to Admission medications   Medication Sig Start Date End Date Taking? Authorizing Provider  acetaminophen (TYLENOL) 500 MG tablet Take 1,000 mg every 8 (eight) hours as needed by mouth for mild pain, moderate pain or fever.    Yes [provider]  aspirin 81 MG chewable tablet Chew 81 mg by mouth daily.   Yes [provider]  calcitRIOL (ROCALTROL) 0.25 MCG capsule Take 0.25 mcg by mouth 2 (two) times daily.    Yes [provider]  calcium carbonate (TUMS - DOSED IN MG ELEMENTAL CALCIUM) 500 MG chewable tablet Chew 2 tablets by mouth 3 (three) times daily.   Yes [provider]  escitalopram (  LEXAPRO) 20 MG tablet Take 20 mg by mouth daily.    Yes [provider]  ferrous sulfate 325 (65 FE) MG tablet Take 325 mg by mouth at bedtime.  11/09/14  Yes [provider]  fluticasone furoate-vilanterol (BREO ELLIPTA) 100-25 MCG/INH AEPB Inhale 1 puff into the lungs.   Yes [provider]  furosemide (LASIX) 20 MG tablet Take 20 mg daily as needed by mouth for edema.    Yes [provider]  levothyroxine (SYNTHROID, LEVOTHROID) 137 MCG tablet Take 137 mcg by mouth at bedtime.    Yes [provider]  metoprolol succinate (TOPROL-XL)  25 MG 24 hr tablet Take 25 mg daily by mouth.   Yes [provider]  nystatin (NYSTATIN) powder Apply 1 g topically 2 (two) times daily as needed (rash).   Yes [provider]  polyethylene glycol (MIRALAX / GLYCOLAX) packet Take 17 packets by mouth daily as needed for moderate constipation. Mix with 8 oz of fluid.    Yes [provider]  senna (SENOKOT) 8.6 MG tablet Take 2 tablets by mouth daily as needed for constipation.    Yes [provider]  tiotropium (SPIRIVA HANDIHALER) 18 MCG inhalation capsule Place 18 mcg into inhaler and inhale daily.    Yes [provider]      VITAL SIGNS:  Blood pressure (!) 107/52, pulse 76, temperature (!) 97.4 F (36.3 C), temperature source Oral, resp. rate 16, weight 68.2 kg, SpO2 99 %.  PHYSICAL EXAMINATION:  GENERAL:  84 y.o.-year-old patient lying in the bed with no acute distress.  EYES: Pupils equal, round, reactive to light and accommodation. No scleral icterus. Extraocular muscles intact.  Pale conjunctiva HEENT: Head atraumatic, normocephalic. Oropharynx and nasopharynx clear.  NECK:  Supple, no jugular venous distention. No thyroid enlargement, no tenderness.  LUNGS: Decreased breath sounds bilaterally, no wheezing, rales,rhonchi or crepitation. No use of accessory muscles of respiration.  CARDIOVASCULAR: S1, S2 normal. No murmurs, rubs, or gallops.  ABDOMEN: Soft, nontender, nondistended. Bowel sounds present. No organomegaly or mass.  EXTREMITIES: Trace pedal edema, no cyanosis, or clubbing.  NEUROLOGIC: Cranial nerves II through XII are intact. Muscle strength 5/5 in all extremities. Sensation intact. Gait not checked.  PSYCHIATRIC: The patient is alert and oriented x 3.  SKIN: Chronic lower extremity skin discoloration  LABORATORY PANEL:   CBC Recent Labs  Lab 08/17/18 1738  WBC 6.7  HGB 7.7*  HCT 25.7*  PLT 267    ------------------------------------------------------------------------------------------------------------------  Chemistries  Recent Labs  Lab 08/17/18 1738  NA 136  K 4.8  CL 94*  CO2 34*  GLUCOSE 90  BUN 31*  CREATININE 1.88*  CALCIUM 9.3  AST 19  ALT 11  ALKPHOS 70  BILITOT 0.4   ------------------------------------------------------------------------------------------------------------------    EKG:   Ordered by me  IMPRESSION AND PLAN:   1.  Acute blood loss anemia.  With the right kidney mass and blood in the ureter seems like this is a renal in nature.  Send off urine analysis and urine culture and urine cytology.  Consult oncology and urology for tomorrow.  Transfuse 1 unit of packed red blood cells.  Benefits and risks of transfusion explained to the patient and son.  Hold aspirin 2.  Chronic kidney disease stage III. 3.  COPD with chronic respiratory failure on oxygen 4.  History of congestive heart failure.  Give Lasix prior to blood transfusion 5.  Alzheimer's disease 6.  History of atrial fibrillation hold aspirin 7.  History of thyroid cancer  All the records are reviewed and case discussed with ED provider. Management plans discussed with the patient, family and they are in agreement.  CODE STATUS: DNR  TOTAL TIME TAKING CARE OF THIS PATIENT: 50 minutes, including acp time.    [name] M.D on 08/17/2018 at 8:25 PM  Between 7am to 6pm - Pager - [phone]  After 6pm call admission pager [phone]  [facility]  [phone]  CC: Primary care physician; [name], MD

## 2018-08-17 NOTE — ED Provider Notes (Addendum)
[facility] Emergency Department Provider Note   ____________________________________________    I have reviewed the triage vital signs and the nursing notes.   HISTORY  Chief Complaint Rectal bleeding  History limited by Alzheimer's dementia  HPI Vanessa Hancock is a 84 y.o. female with history of atrial fibrillation, not on blood thinners who presents with reports of rectal bleeding.  Apparently she had hemoglobin checked by his PCP and found it to be 7.5 which is significantly down from her baseline.  Patient has no complaints.  She denies abdominal pain.  No difficulty breathing, no palpitations or shortness of breath.  She is unable to provide additional history.  There have been no mention of vaginal bleeding per triage note but son confirms that the facility has had rectal bleeding  Past Medical History:  Diagnosis Date  . A-fib ([place])   . Alzheimer disease ([place])    early stages (2016)  . Cancer ([place])    Thyroid  . CHF (congestive heart failure) ([place])   . COPD (chronic obstructive pulmonary disease) ([place])   . Macular degeneration   . Recurrent thyroid cancer ([place]) 03/28/2015  . Sleep apnea     Patient Active Problem List   Diagnosis Date Noted  . Lower GI bleed 08/17/2018  . Incontinence of urine in female 04/15/2016  . Late onset Alzheimer's disease without behavioral disturbance ([place]) 11/16/2015  . Malignant neoplasm of thyroid gland ([place]) 04/10/2015  . Recurrent thyroid cancer ([place]) 03/28/2015  . Atrial fibrillation, chronic 04/07/2014  . Chronic depression 04/07/2014  . CAFL (chronic airflow limitation) ([place]) 04/07/2014  . History of diastolic dysfunction 82/50/5397  . Hypothyroidism 04/07/2014  . Obstructive sleep apnea 04/07/2014  . Chronic a-fib 04/07/2014  . COPD (chronic obstructive pulmonary disease) ([place]) 04/07/2014    Past Surgical History:  Procedure Laterality Date  . APPENDECTOMY    . THYROIDECTOMY    . TONSILLECTOMY     . vericose vein      Prior to Admission medications   Medication Sig Start Date End Date Taking? Authorizing Provider  acetaminophen (TYLENOL) 500 MG tablet Take 1,000 mg every 8 (eight) hours as needed by mouth for mild pain, moderate pain or fever.    Yes [provider]  aspirin 81 MG chewable tablet Chew 81 mg by mouth daily.   Yes [provider]  calcitRIOL (ROCALTROL) 0.25 MCG capsule Take 0.25 mcg by mouth 2 (two) times daily.    Yes [provider]  calcium carbonate (TUMS - DOSED IN MG ELEMENTAL CALCIUM) 500 MG chewable tablet Chew 2 tablets by mouth 3 (three) times daily.   Yes [provider]  escitalopram (LEXAPRO) 20 MG tablet Take 20 mg by mouth daily.    Yes [provider]  ferrous sulfate 325 (65 FE) MG tablet Take 325 mg by mouth at bedtime.  11/09/14  Yes [provider]  fluticasone furoate-vilanterol (BREO ELLIPTA) 100-25 MCG/INH AEPB Inhale 1 puff into the lungs.   Yes [provider]  furosemide (LASIX) 20 MG tablet Take 20 mg daily as needed by mouth for edema.    Yes [provider]  levothyroxine (SYNTHROID, LEVOTHROID) 137 MCG tablet Take 137 mcg by mouth at bedtime.    Yes [provider]  metoprolol succinate (TOPROL-XL) 25 MG 24 hr tablet Take 25 mg daily by mouth.   Yes [provider]  nystatin (NYSTATIN) powder Apply 1 g topically 2 (two) times daily as needed (rash).  Yes [provider]  polyethylene glycol (MIRALAX / GLYCOLAX) packet Take 17 packets by mouth daily as needed for moderate constipation. Mix with 8 oz of fluid.    Yes [provider]  senna (SENOKOT) 8.6 MG tablet Take 2 tablets by mouth daily as needed for constipation.    Yes [provider]  tiotropium (SPIRIVA HANDIHALER) 18 MCG inhalation capsule Place 18 mcg into inhaler and inhale daily.    Yes [provider]     Allergies Patient has no known  allergies.  History reviewed. No pertinent family history.  Social History Social History   Tobacco Use  . Smoking status: Former Research scientist (life sciences)  . Smokeless tobacco: Never Used  Substance Use Topics  . Alcohol use: No    Alcohol/week: 0.0 standard drinks  . Drug use: Not on file    Review of Systems limited by dementia  Constitutional: No reports of fever Eyes: No pallor ENT: No neck pain Cardiovascular: Denies chest pain. Respiratory: As above Gastrointestinal: No abdominal pain.   Genitourinary: Negative for dysuria. Musculoskeletal: Negative for joint swelling Skin: Negative for pallor Neurological: Negative for headaches   ____________________________________________   PHYSICAL EXAM:  VITAL SIGNS: ED Triage Vitals  Enc Vitals Group     BP 08/17/18 1708 (!) 112/52     Pulse Rate 08/17/18 1708 76     Resp 08/17/18 1708 16     Temp 08/17/18 1708 (!) 97.4 F (36.3 C)     Temp Source 08/17/18 1708 Oral     SpO2 08/17/18 1708 100 %     Weight 08/17/18 1709 68.2 kg (150 lb 5.7 oz)     Height --      Head Circumference --      Peak Flow --      Pain Score 08/17/18 1709 0     Pain Loc --      Pain Edu? --      Excl. in [place]? --     Constitutional: Alert  Nose: No congestion/rhinnorhea. Mouth/Throat: Mucous membranes are moist.    Cardiovascular: Grossly normal heart sounds.  Good peripheral circulation. Respiratory: Normal respiratory effort.  No retractions.  Gastrointestinal: Soft and nontender. No distention.  Guaiac negative brown stool Genitourinary: No apparent blood from the vagina Musculoskeletal: 1+ edema bilaterally.  Warm and well perfused Neurologic:  Normal speech and language. No gross focal neurologic deficits are appreciated.  Skin:  Skin is warm, dry and intact. No rash noted.   ____________________________________________   LABS (all labs ordered are listed, but only abnormal results are displayed)  Labs Reviewed  CBC - Abnormal; Notable  for the following components:      Result Value   RBC 3.03 (*)    Hemoglobin 7.7 (*)    HCT 25.7 (*)    MCH 25.4 (*)    All other components within normal limits  COMPREHENSIVE METABOLIC PANEL - Abnormal; Notable for the following components:   Chloride 94 (*)    CO2 34 (*)    BUN 31 (*)    Creatinine, Ser 1.88 (*)    Albumin 3.1 (*)    GFR calc non Af Amer 23 (*)    GFR calc Af Amer 27 (*)    All other components within normal limits  URINE CULTURE  APTT  PROTIME-INR  FERRITIN  VITAMIN B12  URINALYSIS, COMPLETE (UACMP) WITH MICROSCOPIC  BASIC METABOLIC PANEL  CBC  TYPE AND SCREEN  PREPARE RBC (CROSSMATCH)  CYTOLOGY - NON PAP  ____________________________________________  EKG  None ____________________________________________  RADIOLOGY  None ____________________________________________   PROCEDURES  Procedure(s) performed: No  Procedures   Critical Care performed: yes  CRITICAL CARE Performed by: Lavonia Drafts   Total critical care time: 30 minutes  Critical care time was exclusive of separately billable procedures and treating other patients.  Critical care was necessary to treat or prevent imminent or life-threatening deterioration.  Critical care was time spent personally by me on the following activities: development of treatment plan with patient and/or surrogate as well as nursing, discussions with consultants, evaluation of patient's response to treatment, examination of patient, obtaining history from patient or surrogate, ordering and performing treatments and interventions, ordering and review of laboratory studies, ordering and review of radiographic studies, pulse oximetry and re-evaluation of patient's condition.  ____________________________________________   INITIAL IMPRESSION / ASSESSMENT AND PLAN / ED COURSE  Pertinent labs & imaging results that were available during my care of the patient were reviewed by me and considered in my  medical decision making (see chart for details).  Patient presents with reports of rectal bleeding.  Hemoglobin here 7.7 down from 10.5 which appears to be her baseline.  Guaiac negative brown stool, no vaginal bleeding noted.  Will admit to the hospital service for further evaluation    ____________________________________________   FINAL CLINICAL IMPRESSION(S) / ED DIAGNOSES  Final diagnoses:  Gastrointestinal hemorrhage, unspecified gastrointestinal hemorrhage type        Note:  This document was prepared using Dragon voice recognition software and may include unintentional dictation errors.    Lavonia Drafts, MD 08/17/18 2020    Lavonia Drafts, MD 09/05/18 [id number]

## 2018-08-17 NOTE — ED Triage Notes (Signed)
Pt arrived to ED via ambulance from [facility]. Pt saw her physician this morning for vaginal spotting and hgb was noted to be 7.5.

## 2018-08-17 NOTE — Progress Notes (Signed)
Patient ID: Vanessa Hancock, female   DOB: 10-31-1931, 84 y.o.   MRN: [id number]  ACP note  Patient and son present  Diagnosis: Acute blood loss anemia, right kidney mass and blood in the ureter with possible metastases in the lungs, chronic kidney disease, COPD on chronic oxygen, history of congestive heart failure, Alzheimer's disease, history of atrial fibrillation and history of thyroid cancer.  CODE STATUS discussed and patient is a DO NOT RESUSCITATE  Plan.  Transfuse 1 unit of packed red blood cells for acute blood loss anemia.  Monitor hemoglobin.  With the right kidney mass and possible metastases will get oncology and urology consultations.  Send off a urine cytology.  Prognosis is poor.  Time spent on ACP discussion 17 minutes Dr. [name]

## 2018-08-17 NOTE — ED Notes (Addendum)
Pt c/o blood in brief for "months"

## 2018-08-18 ENCOUNTER — Other Ambulatory Visit: Payer: Self-pay

## 2018-08-18 LAB — TYPE AND SCREEN
ABO/RH(D): A POS
Antibody Screen: NEGATIVE
UNIT DIVISION: 0

## 2018-08-18 LAB — BASIC METABOLIC PANEL
ANION GAP: 8 (ref 5–15)
BUN: 31 mg/dL — ABNORMAL HIGH (ref 8–23)
CHLORIDE: 93 mmol/L — AB (ref 98–111)
CO2: 32 mmol/L (ref 22–32)
Calcium: 9.4 mg/dL (ref 8.9–10.3)
Creatinine, Ser: 1.75 mg/dL — ABNORMAL HIGH (ref 0.44–1.00)
GFR calc Af Amer: 30 mL/min — ABNORMAL LOW (ref >60)
GFR calc non Af Amer: 26 mL/min — ABNORMAL LOW (ref >60)
GLUCOSE: 84 mg/dL (ref 70–99)
POTASSIUM: 3.8 mmol/L (ref 3.5–5.1)
Sodium: 133 mmol/L — ABNORMAL LOW (ref 135–145)

## 2018-08-18 LAB — BPAM RBC
Blood Product Expiration Date: 201912072359
ISSUE DATE / TIME: 201911252324
UNIT TYPE AND RH: 6200

## 2018-08-18 LAB — CBC
HEMATOCRIT: 27.4 % — AB (ref 36.0–46.0)
HEMOGLOBIN: 8.5 g/dL — AB (ref 12.0–15.0)
MCH: 26.3 pg (ref 26.0–34.0)
MCHC: 31 g/dL (ref 30.0–36.0)
MCV: 84.8 fL (ref 80.0–100.0)
Platelets: 270 10*3/uL (ref 150–400)
RBC: 3.23 MIL/uL — AB (ref 3.87–5.11)
RDW: 14.6 % (ref 11.5–15.5)
WBC: 6.3 10*3/uL (ref 4.0–10.5)
nRBC: 0 % (ref 0.0–0.2)

## 2018-08-18 LAB — URINALYSIS, COMPLETE (UACMP) WITH MICROSCOPIC
RBC / HPF: >50 RBC/hpf — ABNORMAL HIGH (ref 0–5)
SPECIFIC GRAVITY, URINE: 1.008 (ref 1.005–1.030)
Squamous Epithelial / LPF: NONE SEEN (ref 0–5)
WBC, UA: >50 WBC/hpf — ABNORMAL HIGH (ref 0–5)

## 2018-08-18 LAB — VITAMIN B12: Vitamin B-12: 568 pg/mL (ref 180–914)

## 2018-08-18 LAB — MRSA PCR SCREENING: MRSA by PCR: NEGATIVE

## 2018-08-18 LAB — ABO/RH: ABO/RH(D): A POS

## 2018-08-18 MED ORDER — ORAL CARE MOUTH RINSE: 15.0000 mL | Freq: Two times a day (BID) | OROMUCOSAL | Status: DC

## 2018-08-18 NOTE — Discharge Instructions (Signed)
Hospice to follow at [facility]

## 2018-08-18 NOTE — Clinical Social Work Note (Signed)
Clinical Social Work Assessment  Patient Details  Name: Vanessa Hancock MRN: [id number] Date of Birth: [birth year]  Date of referral:  08/18/18               Reason for consult:  Facility Placement                Permission sought to share information with:  Case Manager, Customer service manager, Family Supports Permission granted to share information::  Yes, Verbal Permission Granted  Name::        Agency::     Relationship::     Contact Information:     Housing/Transportation Living arrangements for the past 2 months:  [place] of Information:  Patient Patient Interpreter Needed:  None Criminal Activity/Legal Involvement Pertinent to Current Situation/Hospitalization:  No - Comment as needed Significant Relationships:  Adult Children, Other Family Members Lives with:  Facility Resident Do you feel safe going back to the place where you live?  Yes Need for family participation in patient care:  Yes (Comment)  Care giving concerns:  Patient is a [facility] resident at [facility].    Social Worker assessment / plan:  CSW consulted for facility placement. CSW met with patient and her granddaughter at bedside. CSW introduced self and explained role. Patient reports that she does live at [facility] in the [place]. Patient reports that she has been there about 3 years. Patient plans to return to [facility] when ready. CSW notified Vanessa Hancock at [facility] of patient admission. Vanessa Hancock states that patient can return when medically ready. CSW will follow for discharge planning.   Employment status:  Retired Nurse, adult PT Recommendations:  Not assessed at this time Information / Referral to community resources:     Patient/Family's Response to care:  Patient thanked CSW for assistance   Patient/Family's Understanding of and Emotional Response to Diagnosis, Current Treatment, and Prognosis:  Patient understands why she is in the  hospital.   Emotional Assessment Appearance:  Appears stated age Attitude/Demeanor/Rapport:    Affect (typically observed):  Accepting, Pleasant Orientation:  Oriented to Self, Oriented to Place, Oriented to  Time Alcohol / Substance use:  Not Applicable Psych involvement (Current and /or in the community):  No (Comment)  Discharge Needs  Concerns to be addressed:    Readmission within the last 30 days:  No Current discharge risk:  None Barriers to Discharge:  Continued Medical Work up   [employer], [place] 08/18/2018, 10:25 AM

## 2018-08-18 NOTE — Discharge Summary (Signed)
[facility] NAME: Vanessa [facility]    MR#:  [id number]  DATE OF BIRTH:  1932-03-13  DATE OF ADMISSION:  08/17/2018 ADMITTING PHYSICIAN: [name], MD  DATE OF DISCHARGE: 08/18/2018  PRIMARY CARE PHYSICIAN: [name], MD    ADMISSION DIAGNOSIS:  Gastrointestinal hemorrhage, unspecified gastrointestinal hemorrhage type [K92.2] Acute blood loss anemia [D62]  DISCHARGE DIAGNOSIS:  Hematuria suspected due to large right Renal mass with possible metastasis to the lungs--family opted for comfort care and no aggressive w/u Acute blood loss anemia due to Hematuria  SECONDARY DIAGNOSIS:   Past Medical History:  Diagnosis Date  . A-fib ([facility])   . Alzheimer disease ([facility])    early stages (2016)  . Cancer ([place])    Thyroid  . CHF (congestive heart failure) ([place])   . COPD (chronic obstructive pulmonary disease) ([facility])   . Macular degeneration   . Recurrent thyroid cancer ([facility]) 03/28/2015  . Sleep apnea     HOSPITAL COURSE:  [name]  is a 84 y.o. female sent in for rectal bleeding but the patient states that it is bleeding in her urine but does not happen every time she urinates.  She was seen by outpatient gastroenterology and sent for a CT scan which showed a right kidney mass and high density material in the right ureter consistent with blood or hematuria.  1.  Acute blood loss anemia.  With the right kidney mass and blood in the ureter seems like this is a renal in nature.  -   Hold aspirin -s/p 1 unit BT hgb 8.5 at present -Appreciate Dr. Carlton Adam input. Given patient's advanced age, COPD advance along with other comorbidities she is at a very high risk for any given procedure which would require sedation. Patient will likely not be able to tolerate any form of treatment at present given all the comorbidities. -Discussed at length with patient's son [name], granddaughter and son-in-law. They do understand  cancer likely is aggressive. She is at risk of re-bleeding and likely be have urinary retention due to blood clots in the bladder. -Family have opted for no aggressive intervention and would like hospice to be involved. -There are okay with patient going back to [facility] with hospice. -Discussed with social worker and will make arrangements. -Currently patient is not in any pain she is comfortable holding of prescribing morphine which can be done at later date as well.  2.  Chronic kidney disease stage III.  3.  Advanced COPD with chronic respiratory failure on oxygen  4.  History of congestive heart failure.    5.  Alzheimer's disease  6.  History of atrial fibrillation  -hold aspirin  7.  History of thyroid cancer  Long term poor prognosis Informed Dr [name] of the plan Cancelled Oncology consult. Dr [name] aware.  D/c to [facility] CONSULTS OBTAINED:    DRUG ALLERGIES:  No Known Allergies  DISCHARGE MEDICATIONS:   Allergies as of 08/18/2018   No Known Allergies     Medication List    STOP taking these medications   aspirin 81 MG chewable tablet     TAKE these medications   acetaminophen 500 MG tablet Commonly known as:  TYLENOL Take 1,000 mg every 8 (eight) hours as needed by mouth for mild pain, moderate pain or fever.   BREO ELLIPTA 100-25 MCG/INH Aepb Generic drug:  fluticasone furoate-vilanterol Inhale 1 puff into the lungs.   calcitRIOL 0.25 MCG  capsule Commonly known as:  ROCALTROL Take 0.25 mcg by mouth 2 (two) times daily.   calcium carbonate 500 MG chewable tablet Commonly known as:  TUMS - dosed in mg elemental calcium Chew 2 tablets by mouth 3 (three) times daily.   escitalopram 20 MG tablet Commonly known as:  LEXAPRO Take 20 mg by mouth daily.   ferrous sulfate 325 (65 FE) MG tablet Take 325 mg by mouth at bedtime.   furosemide 20 MG tablet Commonly known as:  LASIX Take 20 mg daily as needed by mouth for edema.    levothyroxine 137 MCG tablet Commonly known as:  SYNTHROID, LEVOTHROID Take 137 mcg by mouth at bedtime.   metoprolol succinate 25 MG 24 hr tablet Commonly known as:  TOPROL-XL Take 25 mg daily by mouth.   nystatin powder Generic drug:  nystatin Apply 1 g topically 2 (two) times daily as needed (rash).   polyethylene glycol packet Commonly known as:  MIRALAX / GLYCOLAX Take 17 packets by mouth daily as needed for moderate constipation. Mix with 8 oz of fluid.   senna 8.6 MG tablet Commonly known as:  SENOKOT Take 2 tablets by mouth daily as needed for constipation.   SPIRIVA HANDIHALER 18 MCG inhalation capsule Generic drug:  tiotropium Place 18 mcg into inhaler and inhale daily.       If you experience worsening of your admission symptoms, develop shortness of breath, life threatening emergency, suicidal or homicidal thoughts you must seek medical attention immediately by calling 911 or calling your MD immediately  if symptoms less severe.  You Must read complete instructions/literature along with all the possible adverse reactions/side effects for all the Medicines you take and that have been prescribed to you. Take any new Medicines after you have completely understood and accept all the possible adverse reactions/side effects.   Please note  You were cared for by a hospitalist during your hospital stay. If you have any questions about your discharge medications or the care you received while you were in the hospital after you are discharged, you can call the unit and asked to speak with the hospitalist on call if the hospitalist that took care of you is not available. Once you are discharged, your primary care physician will handle any further medical issues. Please note that NO REFILLS for any discharge medications will be authorized once you are discharged, as it is imperative that you return to your primary care physician (or establish a relationship with a primary care  physician if you do not have one) for your aftercare needs so that they can reassess your need for medications and monitor your lab values. Today   SUBJECTIVE   Pt denies any pain. Eating lunch Family in the room  VITAL SIGNS:  Blood pressure (!) 111/59, pulse 66, temperature 97.7 F (36.5 C), temperature source Oral, resp. rate 18, height 5\' 5"  (1.651 m), weight 65.6 kg, SpO2 98 %.  I/O:    Intake/Output Summary (Last 24 hours) at 08/18/2018 1325 Last data filed at 08/18/2018 0258 Gross per 24 hour  Intake 374 ml  Output 1 ml  Net 373 ml    PHYSICAL EXAMINATION:  GENERAL:  84 y.o.-year-old patient lying in the bed with no acute distress. Pallor + EYES: Pupils equal, round, reactive to light and accommodation. No scleral icterus. Extraocular muscles intact.  HEENT: Head atraumatic, normocephalic. Oropharynx and nasopharynx clear.  NECK:  Supple, no jugular venous distention. No thyroid enlargement, no tenderness.  LUNGS: Normal breath  sounds bilaterally, no wheezing, rales,rhonchi or crepitation. No use of accessory muscles of respiration.  CARDIOVASCULAR: S1, S2 normal. No murmurs, rubs, or gallops.  ABDOMEN: Soft, non-tender, non-distended. Bowel sounds present. No organomegaly or mass.  EXTREMITIES: No pedal edema, cyanosis, or clubbing.  NEUROLOGIC: Cranial nerves II through XII are intact. Muscle strength 4/5 in all extremities. Sensation intact. Gait not checked. weak PSYCHIATRIC: The patient is alert and awake SKIN: No obvious rash, lesion, or ulcer.   DATA REVIEW:   CBC  Recent Labs  Lab 08/18/18 0421  WBC 6.3  HGB 8.5*  HCT 27.4*  PLT 270    Chemistries  Recent Labs  Lab 08/17/18 1738 08/18/18 0421  NA 136 133*  K 4.8 3.8  CL 94* 93*  CO2 34* 32  GLUCOSE 90 84  BUN 31* 31*  CREATININE 1.88* 1.75*  CALCIUM 9.3 9.4  AST 19  --   ALT 11  --   ALKPHOS 70  --   BILITOT 0.4  --     Microbiology Results   Recent Results (from the past 240  hour(s))  MRSA PCR Screening     Status: None   Collection Time: 08/18/18 12:11 AM  Result Value Ref Range Status   MRSA by PCR NEGATIVE NEGATIVE Final    Comment:        The GeneXpert MRSA Assay (FDA approved for NASAL specimens only), is one component of a comprehensive MRSA colonization surveillance program. It is not intended to diagnose MRSA infection nor to guide or monitor treatment for MRSA infections. Performed at [facility], [address], [place], [place] [address]     RADIOLOGY:  No results found.   Management plans discussed with the patient, family and they are in agreement.  CODE STATUS:     Code Status Orders  (From admission, onward)         Start     Ordered   08/17/18 2017  Do not attempt resuscitation (DNR)  Continuous    Question Answer Comment  In the event of cardiac or respiratory ARREST Do not call a "code blue"   In the event of cardiac or respiratory ARREST Do not perform Intubation, CPR, defibrillation or ACLS   In the event of cardiac or respiratory ARREST Use medication by any route, position, wound care, and other measures to relive pain and suffering. May use oxygen, suction and manual treatment of airway obstruction as needed for comfort.   Comments nurse may pronounce      08/17/18 2017        Code Status History    This patient has a current code status but no historical code status.    Advance Directive Documentation     Most Recent Value  Type of Advance Directive  Healthcare Power of [place], Out of facility DNR (pink MOST or yellow form)  Pre-existing out of facility DNR order (yellow form or pink MOST form)  Yellow form placed in chart (order not valid for inpatient use)  "MOST" Form in Place?  -      TOTAL TIME TAKING CARE OF THIS PATIENT: *40* minutes.    Fritzi Mandes M.D on 08/18/2018 at 1:25 PM  Between 7am to 6pm - Pager - [phone] After 6pm go to [email or web address] - password [email or web address] [facility]  Office  [phone]  CC: Primary care physician; [name], MD

## 2018-08-18 NOTE — NC FL2 (Signed)
[place] LEVEL OF CARE SCREENING TOOL     IDENTIFICATION  Patient Name: Vanessa Hancock Birthdate: October 07, 1931 Sex: female Admission Date (Current Location): 08/17/2018  [facility] and [place] Number:  Engineering geologist and Address:  [facility], [address], [place], [place] [address]      Provider Number: [id number]  Attending Physician Name and Address:  Fritzi Mandes, MD  Relative Name and Phone Number:       Current Level of Care: Hospital Recommended Level of Care: [place] Prior Approval Number:    Date Approved/Denied:   PASRR Number:    Discharge Plan: SNF    Current Diagnoses: Patient Active Problem List   Diagnosis Date Noted  . Lower GI bleed 08/17/2018  . Acute blood loss anemia 08/17/2018  . Incontinence of urine in female 04/15/2016  . Late onset Alzheimer's disease without behavioral disturbance ([place]) 11/16/2015  . Malignant neoplasm of thyroid gland ([place]) 04/10/2015  . Recurrent thyroid cancer ([place]) 03/28/2015  . Atrial fibrillation, chronic 04/07/2014  . Chronic depression 04/07/2014  . CAFL (chronic airflow limitation) ([place]) 04/07/2014  . History of diastolic dysfunction 83/38/2505  . Hypothyroidism 04/07/2014  . Obstructive sleep apnea 04/07/2014  . Chronic a-fib 04/07/2014  . COPD (chronic obstructive pulmonary disease) ([place]) 04/07/2014    Orientation RESPIRATION BLADDER Height & Weight     Self, Time, Place  O2(2 liters ) Incontinent Weight: 144 lb 9.6 oz (65.6 kg) Height:  5\' 5"  (165.1 cm)  BEHAVIORAL SYMPTOMS/MOOD NEUROLOGICAL BOWEL NUTRITION STATUS  (none) (none) Continent Diet(NPO to be advanced )  AMBULATORY STATUS COMMUNICATION OF NEEDS Skin   Extensive Assist Verbally Normal                       Personal Care Assistance Level of Assistance  Bathing, Feeding, Dressing Bathing Assistance: Limited assistance Feeding assistance: Independent Dressing Assistance: Limited  assistance     Functional Limitations Info  Sight, Hearing, Speech Sight Info: Adequate Hearing Info: Adequate Speech Info: Adequate    SPECIAL CARE FACTORS FREQUENCY                       Contractures Contractures Info: Not present    Additional Factors Info  Code Status, Allergies Code Status Info: DNR Allergies Info: NKA           Current Medications (08/18/2018):  This is the current hospital active medication list Current Facility-Administered Medications  Medication Dose Route Frequency Provider Last Rate Last Dose  . acetaminophen (TYLENOL) tablet 650 mg  650 mg Oral Q6H PRN [name], MD       Or  . acetaminophen (TYLENOL) suppository 650 mg  650 mg Rectal Q6H PRN Wieting, Richard, MD      . acetaminophen (TYLENOL) tablet 1,000 mg  1,000 mg Oral Q8H PRN Wieting, Richard, MD      . calcitRIOL (ROCALTROL) capsule 0.25 mcg  0.25 mcg Oral BID [name], MD   0.25 mcg at 08/17/18 2247  . calcium carbonate (TUMS - dosed in mg elemental calcium) chewable tablet 400 mg of elemental calcium  2 tablet Oral TID [name], MD   400 mg of elemental calcium at 08/17/18 2245  . escitalopram (LEXAPRO) tablet 20 mg  20 mg Oral Daily Wieting, Richard, MD      . ferrous sulfate tablet 325 mg  325 mg Oral QHS [name], MD   325 mg at 08/17/18 2245  .  fluticasone furoate-vilanterol (BREO ELLIPTA) 100-25 MCG/INH 1 puff  1 puff Inhalation Daily Wieting, Richard, MD      . levothyroxine (SYNTHROID, LEVOTHROID) tablet 137 mcg  137 mcg Oral QHS [name], MD   137 mcg at 08/17/18 2245  . MEDLINE mouth rinse  15 mL Mouth Rinse BID Wieting, Richard, MD      . metoprolol succinate (TOPROL-XL) 24 hr tablet 25 mg  25 mg Oral Daily Wieting, Richard, MD      . nystatin (MYCOSTATIN/NYSTOP) topical powder 1 g  1 g Topical BID PRN Wieting, Richard, MD      . ondansetron (ZOFRAN) tablet 4 mg  4 mg Oral Q6H PRN Wieting, Richard, MD       Or  . ondansetron  (ZOFRAN) injection 4 mg  4 mg Intravenous Q6H PRN Wieting, Richard, MD      . polyethylene glycol (MIRALAX / GLYCOLAX) packet 289 g  17 packet Oral Daily PRN Wieting, Richard, MD      . tiotropium (SPIRIVA) inhalation capsule ([facility] use ONLY) 18 mcg  18 mcg Inhalation Daily [name], MD         Discharge Medications: Please see discharge summary for a list of discharge medications.  Relevant Imaging Results:  Relevant Lab Results:   Additional Information    Eshaan Titzer  Louretta Shorten, LCSWA

## 2018-08-18 NOTE — Progress Notes (Signed)
   08/18/18 1000  Clinical Encounter Type  Visited With Patient;Family (Granddaughter)  Visit Type Initial;Spiritual support;Other (Comment) (OR for Prayer)  Recommendations Follow-up as needed  Spiritual Encounters  Spiritual Needs Emotional;Prayer  Stress Factors  Patient Stress Factors Health changes   Chaplain responded to the OR for prayer and met with the patient and her granddaughter. The patient was appreciative of the prayer offered by Chaplain. The patient shared her faith story, especially her decision and baptism. Chaplain provided active listening, presence, and empathy. OR closed.

## 2018-08-18 NOTE — Plan of Care (Signed)

## 2018-08-18 NOTE — Clinical Social Work Note (Signed)
Patient is medically ready for discharge today back to [facility]. MD notified CSW that patient will need hospice services at facility. CSW spoke with patient's son at bedside and he chose [facility]. CSW notified Santiago Glad, hospice liaison of referral. CSW notified Seth Bake at [facility] of discharge today. Patient will be transported by EMS. RN to call report and call for transport.   [place], [place]

## 2018-08-18 NOTE — Progress Notes (Signed)
New referral for [facility] services at [facility] received from [place]. Patient was discharged prior to writer speaking with family. Patient information faxed to referral.  Flo Shanks RN, BSN, [place]  of [place], [facility] [phone]

## 2018-08-18 NOTE — Consult Note (Signed)
I have been asked to see the patient by Dr. [name], for evaluation and management of right renal mass and gross hematuria.  History of present illness: 84 year old female who presented to the emergency department with what was presumed to be a rectal bleed.  Further evaluation demonstrated that this is most likely coming from the urinary tract.  A CT scan without contrast demonstrated a large mass in the right upper pole extending down into the renal pelvis and proximal ureter with likely blood clot in the patient's bladder.  Her hemoglobin dropped approximately 2 g over the last week or so.  Since being admitted she has received 1 unit of packed red blood cells.  The patient has a history of Alzheimer's disease, and so my interview with her was somewhat limited.  Her son was not at the bedside.  However, she did tell me that she has had gross hematuria for at least the last 2 weeks.  She denies any pain.  She told me that the bleeding was intermittent.  She does not endorse any weight loss.  In the course of our conversation, the patient's COPD was preventing the patient from forming more than 4 words without significant effort and shortness of breath.  Review of systems: A 12 point comprehensive review of systems was obtained and is negative unless otherwise stated in the history of present illness.  Patient Active Problem List   Diagnosis Date Noted  . Lower GI bleed 08/17/2018  . Acute blood loss anemia 08/17/2018  . Incontinence of urine in female 04/15/2016  . Late onset Alzheimer's disease without behavioral disturbance ([place]) 11/16/2015  . Malignant neoplasm of thyroid gland ([place]) 04/10/2015  . Recurrent thyroid cancer ([place]) 03/28/2015  . Atrial fibrillation, chronic 04/07/2014  . Chronic depression 04/07/2014  . CAFL (chronic airflow limitation) ([place]) 04/07/2014  . History of diastolic dysfunction 23/53/6144  . Hypothyroidism 04/07/2014  . Obstructive sleep apnea 04/07/2014  .  Chronic a-fib 04/07/2014  . COPD (chronic obstructive pulmonary disease) ([place]) 04/07/2014    No current facility-administered medications on file prior to encounter.    Current Outpatient Medications on File Prior to Encounter  Medication Sig Dispense Refill  . acetaminophen (TYLENOL) 500 MG tablet Take 1,000 mg every 8 (eight) hours as needed by mouth for mild pain, moderate pain or fever.     Marland Kitchen aspirin 81 MG chewable tablet Chew 81 mg by mouth daily.    . calcitRIOL (ROCALTROL) 0.25 MCG capsule Take 0.25 mcg by mouth 2 (two) times daily.     . calcium carbonate (TUMS - DOSED IN MG ELEMENTAL CALCIUM) 500 MG chewable tablet Chew 2 tablets by mouth 3 (three) times daily.    Marland Kitchen escitalopram (LEXAPRO) 20 MG tablet Take 20 mg by mouth daily.     . ferrous sulfate 325 (65 FE) MG tablet Take 325 mg by mouth at bedtime.     . fluticasone furoate-vilanterol (BREO ELLIPTA) 100-25 MCG/INH AEPB Inhale 1 puff into the lungs.    . furosemide (LASIX) 20 MG tablet Take 20 mg daily as needed by mouth for edema.     Marland Kitchen levothyroxine (SYNTHROID, LEVOTHROID) 137 MCG tablet Take 137 mcg by mouth at bedtime.     . metoprolol succinate (TOPROL-XL) 25 MG 24 hr tablet Take 25 mg daily by mouth.    . nystatin (NYSTATIN) powder Apply 1 g topically 2 (two) times daily as needed (rash).    . polyethylene glycol (MIRALAX / GLYCOLAX) packet Take 17 packets by  mouth daily as needed for moderate constipation. Mix with 8 oz of fluid.     Marland Kitchen senna (SENOKOT) 8.6 MG tablet Take 2 tablets by mouth daily as needed for constipation.     Marland Kitchen tiotropium (SPIRIVA HANDIHALER) 18 MCG inhalation capsule Place 18 mcg into inhaler and inhale daily.       Past Medical History:  Diagnosis Date  . A-fib ([place])   . Alzheimer disease ([place])    early stages (2016)  . Cancer ([place])    Thyroid  . CHF (congestive heart failure) ([place])   . COPD (chronic obstructive pulmonary disease) ([place])   . Macular degeneration   . Recurrent thyroid cancer  ([place]) 03/28/2015  . Sleep apnea     Past Surgical History:  Procedure Laterality Date  . APPENDECTOMY    . THYROIDECTOMY    . TONSILLECTOMY    . vericose vein      Social History   Tobacco Use  . Smoking status: Former Research scientist (life sciences)  . Smokeless tobacco: Never Used  Substance Use Topics  . Alcohol use: No    Alcohol/week: 0.0 standard drinks  . Drug use: Not on file    Family History  Problem Relation Age of Onset  . Diabetes Mother     PE: Vitals:   08/17/18 2100 08/17/18 2317 08/17/18 2354 08/18/18 0255  BP:  109/60 116/60 (!) 115/55  Pulse:  76 76 69  Resp:  18 18 20   Temp:  98 F (36.7 C) (!) 97.2 F (36.2 C) (!) 97.5 F (36.4 C)  TempSrc:  Oral Oral Oral  SpO2:  98% 97% 94%  Weight: 65.6 kg     Height: 5\' 5"  (1.651 m)      Patient appears to be in no acute distress  patient is alert and collected Atraumatic normocephalic head No cervical or supraclavicular lymphadenopathy appreciated No increased work of breathing, no audible wheezes/rhonchi Regular sinus rhythm/rate Abdomen is soft, nontender, nondistended, no CVA or suprapubic tenderness Lower extremities are symmetric without appreciable edema Grossly neurologically intact No identifiable skin lesions  Recent Labs    08/17/18 1738 08/18/18 0421  WBC 6.7 6.3  HGB 7.7* 8.5*  HCT 25.7* 27.4*   Recent Labs    08/17/18 1738 08/18/18 0421  NA 136 133*  K 4.8 3.8  CL 94* 93*  CO2 34* 32  GLUCOSE 90 84  BUN 31* 31*  CREATININE 1.88* 1.75*  CALCIUM 9.3 9.4   Recent Labs    08/17/18 1738  INR 1.12   No results for input(s): LABURIN in the last 72 hours. Results for orders placed or performed during the hospital encounter of 08/17/18  MRSA PCR Screening     Status: None   Collection Time: 08/18/18 12:11 AM  Result Value Ref Range Status   MRSA by PCR NEGATIVE NEGATIVE Final    Comment:        The GeneXpert MRSA Assay (FDA approved for NASAL specimens only), is one component of  a comprehensive MRSA colonization surveillance program. It is not intended to diagnose MRSA infection nor to guide or monitor treatment for MRSA infections. Performed at [facility], [address] [place], [place] [address]     Imaging: I have reviewed the patient's CT scan performed upon this admission.  I also reviewed her chest x-ray as well as her PET CT scan from 2015.  The patient has a mass in the right upper pole of her kidney that appears to extend into the collecting  system and into the proximal ureter.  This is most consistent with transitional cell carcinoma.  The lungs do demonstrate pulmonary nodules that appear to have increased in size compared to 2017, but will present at that time. Interestingly, on the patient's PET CT scan in 2015 there was no obvious lesion in the patient's right kidney.  However, her UPJ and proximal ureter were fuller than the left side for comparison.   Imp/Discussion: The patient has gross hematuria which appears to be coming from her right kidney, and I suspect that this is secondary to transitional cell carcinoma.  However, the CT scan is very limited without contrast.  There are some areas in the lungs which are concerning for metastatic disease, however these have been present dating all the way back to 2017, and do not appear to have grown quickly over the time span.  Perhaps a dedicated chest CT scan would be more revealing of additional lesions.  She does have a history of recurrent thyroid cancer, but given the lack of other findings on her CT scan, I am less suspicious that this is a metastatic lesion.  The patient's overall health and associated comorbidities are likely to prevent any aggressive work-up or treatment.  Her kidney function is inadequate to obtain a contrast-enhanced CT scan.  Further, she has severe/advanced COPD and I am not sure that she would really tolerate cystoscopy and diagnostic ureteroscopy.  Oncologically  speaking, biopsying a transitional cell carcinoma with a core needle biopsy does have a higher risk of seeding the track and for this reason is usually not performed, but given her circumstance and her overall comorbidities, this is likely less of concern.  As such, this may be an option if it is felt that the patient can tolerate the biopsy which will give [place] more information for treatment and prognosis guidance.  An alternative option may be to embolize the patient's right kidney which would help control the bleeding, but again is fairly aggressive given her advanced/end-stage COPD.  The final treatment option for her would be supportive measures and transfusion as necessary.  This may include in the future continuous bladder irrigation if she does develop clots within her bladder that she is unable to empty.  However, at this time she appears to be voiding without any issues.  Treatment goals do need to be defined, and guidance from internal medicine in terms of her ability to tolerate some of these procedures is warranted.  If it is felt that the patient can tolerate a biopsy and the family would like to pursue it, interventional radiology should be consulted.  I will pass this patient along to my partners at [facility], who will continue to follow and add guidance as necessary.      Louis Meckel W

## 2018-08-19 DIAGNOSIS — C649 Malignant neoplasm of unspecified kidney, except renal pelvis: Secondary | ICD-10-CM | POA: Diagnosis not present

## 2018-08-20 LAB — URINE CULTURE: SPECIAL REQUESTS: NORMAL

## 2018-08-24 ENCOUNTER — Telehealth: Payer: Self-pay | Admitting: Oncology

## 2018-08-24 NOTE — Telephone Encounter (Signed)
Per conversation with patients son Wynonia Lawman, they are choosing not to follow-up with the Oncology Clinic at this time. Instructed patients son that if they should change their mind, please contact the office to get scheduled to see Dr. [name].

## 2018-08-25 DIAGNOSIS — C641 Malignant neoplasm of right kidney, except renal pelvis: Secondary | ICD-10-CM | POA: Diagnosis not present

## 2018-10-05 DIAGNOSIS — R05 Cough: Secondary | ICD-10-CM

## 2018-10-21 DIAGNOSIS — B351 Tinea unguium: Secondary | ICD-10-CM

## 2018-11-18 DIAGNOSIS — N184 Chronic kidney disease, stage 4 (severe): Secondary | ICD-10-CM | POA: Diagnosis not present

## 2018-11-18 DIAGNOSIS — J441 Chronic obstructive pulmonary disease with (acute) exacerbation: Secondary | ICD-10-CM | POA: Diagnosis not present

## 2018-11-18 DIAGNOSIS — E039 Hypothyroidism, unspecified: Secondary | ICD-10-CM | POA: Diagnosis not present

## 2018-11-18 DIAGNOSIS — F332 Major depressive disorder, recurrent severe without psychotic features: Secondary | ICD-10-CM

## 2018-11-18 DIAGNOSIS — I4891 Unspecified atrial fibrillation: Secondary | ICD-10-CM | POA: Diagnosis not present

## 2018-11-18 DIAGNOSIS — G309 Alzheimer's disease, unspecified: Secondary | ICD-10-CM | POA: Diagnosis not present

## 2019-01-04 DIAGNOSIS — K648 Other hemorrhoids: Secondary | ICD-10-CM | POA: Diagnosis not present

## 2019-01-06 DIAGNOSIS — K648 Other hemorrhoids: Secondary | ICD-10-CM | POA: Diagnosis not present

## 2019-01-18 DIAGNOSIS — J9611 Chronic respiratory failure with hypoxia: Secondary | ICD-10-CM | POA: Diagnosis not present

## 2019-01-18 DIAGNOSIS — G301 Alzheimer's disease with late onset: Secondary | ICD-10-CM | POA: Diagnosis not present

## 2019-01-18 DIAGNOSIS — F331 Major depressive disorder, recurrent, moderate: Secondary | ICD-10-CM | POA: Diagnosis not present

## 2019-01-18 DIAGNOSIS — I480 Paroxysmal atrial fibrillation: Secondary | ICD-10-CM | POA: Diagnosis not present

## 2019-01-18 DIAGNOSIS — E440 Moderate protein-calorie malnutrition: Secondary | ICD-10-CM | POA: Diagnosis not present

## 2019-01-18 DIAGNOSIS — C4490 Unspecified malignant neoplasm of skin, unspecified: Secondary | ICD-10-CM | POA: Diagnosis not present

## 2019-01-18 DIAGNOSIS — J439 Emphysema, unspecified: Secondary | ICD-10-CM | POA: Diagnosis not present

## 2019-01-21 DIAGNOSIS — E039 Hypothyroidism, unspecified: Secondary | ICD-10-CM | POA: Diagnosis not present

## 2019-01-28 DIAGNOSIS — D649 Anemia, unspecified: Secondary | ICD-10-CM | POA: Diagnosis not present

## 2019-01-28 DIAGNOSIS — E782 Mixed hyperlipidemia: Secondary | ICD-10-CM | POA: Diagnosis not present

## 2019-02-02 ENCOUNTER — Ambulatory Visit: Payer: Medicare HMO | Admitting: Internal Medicine

## 2019-02-03 DIAGNOSIS — B351 Tinea unguium: Secondary | ICD-10-CM | POA: Diagnosis not present

## 2019-03-24 DEATH — deceased
